# Patient Record
Sex: Female | Born: 1935 | Race: White | Hispanic: No | Smoking: Never smoker
Health system: Southern US, Community
[De-identification: ages and names within clinical notes are randomized; demographics above are authoritative.]

## PROBLEM LIST (undated history)

## (undated) DIAGNOSIS — I340 Nonrheumatic mitral (valve) insufficiency: Secondary | ICD-10-CM

## (undated) DIAGNOSIS — D689 Coagulation defect, unspecified: Secondary | ICD-10-CM

## (undated) DIAGNOSIS — R9430 Abnormal result of cardiovascular function study, unspecified: Secondary | ICD-10-CM

## (undated) DIAGNOSIS — M8580 Other specified disorders of bone density and structure, unspecified site: Secondary | ICD-10-CM

## (undated) DIAGNOSIS — IMO0002 Reserved for concepts with insufficient information to code with codable children: Secondary | ICD-10-CM

## (undated) DIAGNOSIS — E039 Hypothyroidism, unspecified: Secondary | ICD-10-CM

## (undated) DIAGNOSIS — B029 Zoster without complications: Secondary | ICD-10-CM

## (undated) DIAGNOSIS — I639 Cerebral infarction, unspecified: Secondary | ICD-10-CM

## (undated) DIAGNOSIS — M1990 Unspecified osteoarthritis, unspecified site: Secondary | ICD-10-CM

## (undated) DIAGNOSIS — IMO0001 Reserved for inherently not codable concepts without codable children: Secondary | ICD-10-CM

## (undated) DIAGNOSIS — M810 Age-related osteoporosis without current pathological fracture: Secondary | ICD-10-CM

## (undated) DIAGNOSIS — K219 Gastro-esophageal reflux disease without esophagitis: Secondary | ICD-10-CM

## (undated) DIAGNOSIS — I671 Cerebral aneurysm, nonruptured: Secondary | ICD-10-CM

## (undated) DIAGNOSIS — Z8541 Personal history of malignant neoplasm of cervix uteri: Secondary | ICD-10-CM

## (undated) DIAGNOSIS — J90 Pleural effusion, not elsewhere classified: Secondary | ICD-10-CM

## (undated) DIAGNOSIS — I351 Nonrheumatic aortic (valve) insufficiency: Secondary | ICD-10-CM

## (undated) DIAGNOSIS — I10 Essential (primary) hypertension: Secondary | ICD-10-CM

## (undated) DIAGNOSIS — I2699 Other pulmonary embolism without acute cor pulmonale: Secondary | ICD-10-CM

## (undated) DIAGNOSIS — I609 Nontraumatic subarachnoid hemorrhage, unspecified: Secondary | ICD-10-CM

## (undated) DIAGNOSIS — Z86718 Personal history of other venous thrombosis and embolism: Secondary | ICD-10-CM

## (undated) DIAGNOSIS — I509 Heart failure, unspecified: Secondary | ICD-10-CM

## (undated) DIAGNOSIS — E785 Hyperlipidemia, unspecified: Secondary | ICD-10-CM

## (undated) DIAGNOSIS — N289 Disorder of kidney and ureter, unspecified: Secondary | ICD-10-CM

## (undated) DIAGNOSIS — J449 Chronic obstructive pulmonary disease, unspecified: Secondary | ICD-10-CM

## (undated) DIAGNOSIS — D3500 Benign neoplasm of unspecified adrenal gland: Secondary | ICD-10-CM

## (undated) DIAGNOSIS — H3530 Unspecified macular degeneration: Secondary | ICD-10-CM

## (undated) DIAGNOSIS — D696 Thrombocytopenia, unspecified: Secondary | ICD-10-CM

## (undated) DIAGNOSIS — N189 Chronic kidney disease, unspecified: Secondary | ICD-10-CM

## (undated) DIAGNOSIS — I77810 Thoracic aortic ectasia: Secondary | ICD-10-CM

## (undated) DIAGNOSIS — I4891 Unspecified atrial fibrillation: Secondary | ICD-10-CM

## (undated) DIAGNOSIS — D649 Anemia, unspecified: Secondary | ICD-10-CM

## (undated) DIAGNOSIS — R0602 Shortness of breath: Secondary | ICD-10-CM

## (undated) DIAGNOSIS — D519 Vitamin B12 deficiency anemia, unspecified: Secondary | ICD-10-CM

## (undated) DIAGNOSIS — R42 Dizziness and giddiness: Secondary | ICD-10-CM

## (undated) DIAGNOSIS — N259 Disorder resulting from impaired renal tubular function, unspecified: Secondary | ICD-10-CM

## (undated) HISTORY — DX: Cerebral infarction, unspecified: I63.9

## (undated) HISTORY — DX: Benign neoplasm of unspecified adrenal gland: D35.00

## (undated) HISTORY — DX: Unspecified atrial fibrillation: I48.91

## (undated) HISTORY — DX: Personal history of other venous thrombosis and embolism: Z86.718

## (undated) HISTORY — DX: Chronic kidney disease, unspecified: N18.9

## (undated) HISTORY — DX: Unspecified osteoarthritis, unspecified site: M19.90

## (undated) HISTORY — DX: Gastro-esophageal reflux disease without esophagitis: K21.9

## (undated) HISTORY — DX: Other pulmonary embolism without acute cor pulmonale: I26.99

## (undated) HISTORY — DX: Hypothyroidism, unspecified: E03.9

## (undated) HISTORY — PX: CHOLECYSTECTOMY: SHX55

## (undated) HISTORY — PX: APPENDECTOMY: SHX54

## (undated) HISTORY — DX: Dizziness and giddiness: R42

## (undated) HISTORY — PX: CATARACT EXTRACTION: SUR2

## (undated) HISTORY — DX: Anemia, unspecified: D64.9

## (undated) HISTORY — PX: FOOT SURGERY: SHX648

## (undated) HISTORY — PX: ORIF HIP FRACTURE: SHX2125

## (undated) HISTORY — PX: JOINT REPLACEMENT: SHX530

## (undated) HISTORY — DX: Coagulation defect, unspecified: D68.9

## (undated) HISTORY — DX: Essential (primary) hypertension: I10

## (undated) HISTORY — DX: Nonrheumatic mitral (valve) insufficiency: I34.0

## (undated) HISTORY — DX: Age-related osteoporosis without current pathological fracture: M81.0

## (undated) HISTORY — DX: Disorder of kidney and ureter, unspecified: N28.9

## (undated) HISTORY — DX: Reserved for concepts with insufficient information to code with codable children: IMO0002

## (undated) HISTORY — DX: Heart failure, unspecified: I50.9

## (undated) HISTORY — DX: Zoster without complications: B02.9

## (undated) HISTORY — DX: Disorder resulting from impaired renal tubular function, unspecified: N25.9

## (undated) HISTORY — DX: Chronic obstructive pulmonary disease, unspecified: J44.9

## (undated) HISTORY — PX: PULMONARY EMBOLISM SURGERY: SHX752

## (undated) HISTORY — PX: TOTAL HIP ARTHROPLASTY: SHX124

## (undated) HISTORY — DX: Abnormal result of cardiovascular function study, unspecified: R94.30

## (undated) HISTORY — DX: Pleural effusion, not elsewhere classified: J90

## (undated) HISTORY — DX: Personal history of malignant neoplasm of cervix uteri: Z85.41

## (undated) HISTORY — DX: Cerebral aneurysm, nonruptured: I67.1

## (undated) HISTORY — DX: Thrombocytopenia, unspecified: D69.6

## (undated) HISTORY — DX: Nonrheumatic aortic (valve) insufficiency: I35.1

## (undated) HISTORY — DX: Hyperlipidemia, unspecified: E78.5

## (undated) HISTORY — PX: EPIDURAL BLOCK INJECTION: SHX1516

## (undated) HISTORY — DX: Nontraumatic subarachnoid hemorrhage, unspecified: I60.9

## (undated) HISTORY — PX: ABDOMINAL HYSTERECTOMY: SHX81

## (undated) HISTORY — DX: Shortness of breath: R06.02

## (undated) HISTORY — DX: Unspecified macular degeneration: H35.30

## (undated) HISTORY — DX: Reserved for inherently not codable concepts without codable children: IMO0001

## (undated) HISTORY — DX: Thoracic aortic ectasia: I77.810

## (undated) HISTORY — DX: Other specified disorders of bone density and structure, unspecified site: M85.80

## (undated) HISTORY — DX: Vitamin B12 deficiency anemia, unspecified: D51.9

---

## 2000-09-27 ENCOUNTER — Other Ambulatory Visit: Admission: RE | Admit: 2000-09-27 | Discharge: 2000-09-27 | Payer: Self-pay | Admitting: *Deleted

## 2001-09-10 ENCOUNTER — Inpatient Hospital Stay (HOSPITAL_COMMUNITY): Admission: EM | Admit: 2001-09-10 | Discharge: 2001-09-22 | Payer: Self-pay | Admitting: Emergency Medicine

## 2001-09-12 ENCOUNTER — Encounter: Payer: Self-pay | Admitting: Internal Medicine

## 2001-09-15 ENCOUNTER — Encounter: Payer: Self-pay | Admitting: Internal Medicine

## 2001-09-18 ENCOUNTER — Encounter: Payer: Self-pay | Admitting: Internal Medicine

## 2001-10-19 ENCOUNTER — Ambulatory Visit (HOSPITAL_COMMUNITY): Admission: RE | Admit: 2001-10-19 | Discharge: 2001-10-19 | Payer: Self-pay | Admitting: Family Medicine

## 2001-10-19 ENCOUNTER — Encounter: Payer: Self-pay | Admitting: Family Medicine

## 2002-02-03 ENCOUNTER — Encounter: Payer: Self-pay | Admitting: Surgery

## 2002-02-03 ENCOUNTER — Ambulatory Visit (HOSPITAL_COMMUNITY): Admission: RE | Admit: 2002-02-03 | Discharge: 2002-02-03 | Payer: Self-pay | Admitting: Surgery

## 2002-02-03 ENCOUNTER — Encounter (INDEPENDENT_AMBULATORY_CARE_PROVIDER_SITE_OTHER): Payer: Self-pay | Admitting: *Deleted

## 2002-03-25 ENCOUNTER — Encounter: Payer: Self-pay | Admitting: Emergency Medicine

## 2002-03-25 ENCOUNTER — Emergency Department (HOSPITAL_COMMUNITY): Admission: EM | Admit: 2002-03-25 | Discharge: 2002-03-25 | Payer: Self-pay

## 2002-04-25 ENCOUNTER — Encounter: Payer: Self-pay | Admitting: Family Medicine

## 2002-04-25 ENCOUNTER — Inpatient Hospital Stay (HOSPITAL_COMMUNITY): Admission: EM | Admit: 2002-04-25 | Discharge: 2002-04-28 | Payer: Self-pay | Admitting: Emergency Medicine

## 2002-07-27 ENCOUNTER — Encounter (INDEPENDENT_AMBULATORY_CARE_PROVIDER_SITE_OTHER): Payer: Self-pay | Admitting: *Deleted

## 2002-07-27 ENCOUNTER — Encounter: Payer: Self-pay | Admitting: Surgery

## 2002-07-27 ENCOUNTER — Ambulatory Visit (HOSPITAL_COMMUNITY): Admission: RE | Admit: 2002-07-27 | Discharge: 2002-07-27 | Payer: Self-pay | Admitting: Surgery

## 2003-03-01 ENCOUNTER — Inpatient Hospital Stay (HOSPITAL_COMMUNITY): Admission: AD | Admit: 2003-03-01 | Discharge: 2003-03-08 | Payer: Self-pay | Admitting: Family Medicine

## 2003-08-15 ENCOUNTER — Ambulatory Visit: Admission: RE | Admit: 2003-08-15 | Discharge: 2003-08-15 | Payer: Self-pay | Admitting: Family Medicine

## 2003-12-24 ENCOUNTER — Ambulatory Visit: Payer: Self-pay | Admitting: Family Medicine

## 2004-02-20 ENCOUNTER — Ambulatory Visit: Payer: Self-pay | Admitting: Family Medicine

## 2004-03-03 ENCOUNTER — Ambulatory Visit: Payer: Self-pay | Admitting: Internal Medicine

## 2004-03-05 ENCOUNTER — Ambulatory Visit: Payer: Self-pay | Admitting: Family Medicine

## 2004-03-14 ENCOUNTER — Ambulatory Visit: Payer: Self-pay | Admitting: Family Medicine

## 2004-03-24 ENCOUNTER — Ambulatory Visit: Payer: Self-pay | Admitting: Family Medicine

## 2004-04-16 ENCOUNTER — Ambulatory Visit: Payer: Self-pay | Admitting: Family Medicine

## 2004-04-16 ENCOUNTER — Inpatient Hospital Stay (HOSPITAL_COMMUNITY): Admission: RE | Admit: 2004-04-16 | Discharge: 2004-04-21 | Payer: Self-pay | Admitting: Family Medicine

## 2004-04-16 ENCOUNTER — Ambulatory Visit: Payer: Self-pay | Admitting: Internal Medicine

## 2004-04-25 ENCOUNTER — Ambulatory Visit: Payer: Self-pay | Admitting: Family Medicine

## 2004-04-28 ENCOUNTER — Ambulatory Visit: Payer: Self-pay | Admitting: Family Medicine

## 2004-05-01 ENCOUNTER — Ambulatory Visit: Payer: Self-pay | Admitting: Family Medicine

## 2004-05-08 ENCOUNTER — Ambulatory Visit: Payer: Self-pay | Admitting: Family Medicine

## 2004-05-12 ENCOUNTER — Ambulatory Visit: Payer: Self-pay | Admitting: Family Medicine

## 2004-05-15 ENCOUNTER — Ambulatory Visit: Payer: Self-pay | Admitting: Hematology & Oncology

## 2004-05-19 ENCOUNTER — Ambulatory Visit: Payer: Self-pay | Admitting: Family Medicine

## 2004-05-26 ENCOUNTER — Ambulatory Visit: Payer: Self-pay | Admitting: Internal Medicine

## 2004-06-02 ENCOUNTER — Ambulatory Visit: Payer: Self-pay | Admitting: Family Medicine

## 2004-06-09 ENCOUNTER — Ambulatory Visit: Payer: Self-pay | Admitting: Family Medicine

## 2004-06-16 ENCOUNTER — Ambulatory Visit: Payer: Self-pay | Admitting: Family Medicine

## 2004-06-26 ENCOUNTER — Ambulatory Visit: Payer: Self-pay | Admitting: Family Medicine

## 2004-06-26 ENCOUNTER — Ambulatory Visit (HOSPITAL_COMMUNITY): Admission: RE | Admit: 2004-06-26 | Discharge: 2004-06-26 | Payer: Self-pay | Admitting: Family Medicine

## 2004-07-02 ENCOUNTER — Ambulatory Visit: Payer: Self-pay | Admitting: *Deleted

## 2004-07-02 ENCOUNTER — Encounter (INDEPENDENT_AMBULATORY_CARE_PROVIDER_SITE_OTHER): Payer: Self-pay | Admitting: *Deleted

## 2004-07-03 ENCOUNTER — Ambulatory Visit: Payer: Self-pay | Admitting: Internal Medicine

## 2004-07-14 ENCOUNTER — Ambulatory Visit: Payer: Self-pay | Admitting: Family Medicine

## 2004-07-28 ENCOUNTER — Ambulatory Visit: Payer: Self-pay | Admitting: Family Medicine

## 2004-08-13 ENCOUNTER — Ambulatory Visit: Payer: Self-pay | Admitting: Family Medicine

## 2004-08-15 ENCOUNTER — Ambulatory Visit: Payer: Self-pay | Admitting: Family Medicine

## 2004-08-28 ENCOUNTER — Ambulatory Visit: Payer: Self-pay | Admitting: Family Medicine

## 2004-09-11 ENCOUNTER — Ambulatory Visit: Payer: Self-pay | Admitting: Family Medicine

## 2004-10-15 ENCOUNTER — Ambulatory Visit: Payer: Self-pay | Admitting: Family Medicine

## 2004-10-22 ENCOUNTER — Ambulatory Visit: Payer: Self-pay | Admitting: Family Medicine

## 2004-10-29 ENCOUNTER — Ambulatory Visit: Payer: Self-pay | Admitting: Internal Medicine

## 2004-11-05 ENCOUNTER — Ambulatory Visit: Payer: Self-pay | Admitting: Family Medicine

## 2004-12-03 ENCOUNTER — Ambulatory Visit: Payer: Self-pay | Admitting: Family Medicine

## 2005-01-13 ENCOUNTER — Ambulatory Visit: Payer: Self-pay | Admitting: Family Medicine

## 2005-01-27 ENCOUNTER — Encounter: Admission: RE | Admit: 2005-01-27 | Discharge: 2005-01-27 | Payer: Self-pay | Admitting: Family Medicine

## 2005-01-27 ENCOUNTER — Ambulatory Visit: Payer: Self-pay | Admitting: Family Medicine

## 2005-02-09 DIAGNOSIS — I671 Cerebral aneurysm, nonruptured: Secondary | ICD-10-CM

## 2005-02-09 HISTORY — DX: Cerebral aneurysm, nonruptured: I67.1

## 2005-02-11 ENCOUNTER — Ambulatory Visit: Payer: Self-pay | Admitting: Family Medicine

## 2005-02-18 ENCOUNTER — Ambulatory Visit: Payer: Self-pay | Admitting: Family Medicine

## 2005-03-16 ENCOUNTER — Ambulatory Visit: Payer: Self-pay | Admitting: Family Medicine

## 2005-04-15 ENCOUNTER — Ambulatory Visit: Payer: Self-pay | Admitting: Family Medicine

## 2005-05-14 ENCOUNTER — Ambulatory Visit: Payer: Self-pay | Admitting: Internal Medicine

## 2005-05-19 ENCOUNTER — Ambulatory Visit: Payer: Self-pay | Admitting: Family Medicine

## 2005-06-11 ENCOUNTER — Ambulatory Visit: Payer: Self-pay | Admitting: Family Medicine

## 2005-06-19 ENCOUNTER — Ambulatory Visit: Payer: Self-pay | Admitting: Family Medicine

## 2005-06-27 ENCOUNTER — Ambulatory Visit: Payer: Self-pay | Admitting: Internal Medicine

## 2005-06-29 ENCOUNTER — Ambulatory Visit: Payer: Self-pay | Admitting: Family Medicine

## 2005-07-02 ENCOUNTER — Ambulatory Visit: Payer: Self-pay | Admitting: Family Medicine

## 2005-07-09 ENCOUNTER — Ambulatory Visit: Payer: Self-pay | Admitting: Family Medicine

## 2005-07-13 ENCOUNTER — Ambulatory Visit: Payer: Self-pay | Admitting: Family Medicine

## 2005-07-27 ENCOUNTER — Ambulatory Visit: Payer: Self-pay | Admitting: Family Medicine

## 2005-08-03 ENCOUNTER — Ambulatory Visit: Payer: Self-pay | Admitting: Family Medicine

## 2005-08-17 ENCOUNTER — Ambulatory Visit: Payer: Self-pay | Admitting: Family Medicine

## 2005-08-28 ENCOUNTER — Ambulatory Visit: Payer: Self-pay | Admitting: Family Medicine

## 2005-09-11 ENCOUNTER — Ambulatory Visit: Payer: Self-pay | Admitting: Family Medicine

## 2005-09-25 ENCOUNTER — Ambulatory Visit: Payer: Self-pay

## 2005-09-25 ENCOUNTER — Encounter: Payer: Self-pay | Admitting: Cardiology

## 2005-09-26 ENCOUNTER — Inpatient Hospital Stay (HOSPITAL_COMMUNITY): Admission: EM | Admit: 2005-09-26 | Discharge: 2005-10-09 | Payer: Self-pay | Admitting: Emergency Medicine

## 2005-09-27 ENCOUNTER — Encounter (INDEPENDENT_AMBULATORY_CARE_PROVIDER_SITE_OTHER): Payer: Self-pay | Admitting: *Deleted

## 2005-11-25 ENCOUNTER — Ambulatory Visit: Payer: Self-pay | Admitting: Family Medicine

## 2005-12-17 ENCOUNTER — Ambulatory Visit: Payer: Self-pay | Admitting: Family Medicine

## 2005-12-24 ENCOUNTER — Ambulatory Visit: Payer: Self-pay | Admitting: Family Medicine

## 2005-12-24 LAB — CONVERTED CEMR LAB
ALT: 23 units/L (ref 0–40)
AST: 36 units/L (ref 0–37)
Alkaline Phosphatase: 56 units/L (ref 39–117)
BUN: 25 mg/dL — ABNORMAL HIGH (ref 6–23)
Calcium: 9.8 mg/dL (ref 8.4–10.5)
Chloride: 102 meq/L (ref 96–112)
GFR calc non Af Amer: 33 mL/min
Glomerular Filtration Rate, Af Am: 39 mL/min/{1.73_m2}
Glucose, Bld: 91 mg/dL (ref 70–99)
Potassium: 3.8 meq/L (ref 3.5–5.1)
TSH: 4.87 microintl units/mL (ref 0.35–5.50)
Total Protein: 7.3 g/dL (ref 6.0–8.3)

## 2006-01-11 ENCOUNTER — Ambulatory Visit: Payer: Self-pay | Admitting: Family Medicine

## 2006-01-28 ENCOUNTER — Ambulatory Visit: Payer: Self-pay | Admitting: Family Medicine

## 2006-02-11 ENCOUNTER — Ambulatory Visit: Payer: Self-pay | Admitting: Family Medicine

## 2006-02-15 DIAGNOSIS — I10 Essential (primary) hypertension: Secondary | ICD-10-CM | POA: Insufficient documentation

## 2006-02-15 DIAGNOSIS — Z86718 Personal history of other venous thrombosis and embolism: Secondary | ICD-10-CM

## 2006-02-15 DIAGNOSIS — E785 Hyperlipidemia, unspecified: Secondary | ICD-10-CM

## 2006-02-16 ENCOUNTER — Encounter: Payer: Self-pay | Admitting: Family Medicine

## 2006-02-19 ENCOUNTER — Emergency Department (HOSPITAL_COMMUNITY): Admission: EM | Admit: 2006-02-19 | Discharge: 2006-02-20 | Payer: Self-pay | Admitting: Emergency Medicine

## 2006-02-19 ENCOUNTER — Ambulatory Visit: Payer: Self-pay | Admitting: Family Medicine

## 2006-02-19 ENCOUNTER — Encounter: Admission: RE | Admit: 2006-02-19 | Discharge: 2006-02-19 | Payer: Self-pay | Admitting: Family Medicine

## 2006-02-19 ENCOUNTER — Ambulatory Visit (HOSPITAL_COMMUNITY): Admission: RE | Admit: 2006-02-19 | Discharge: 2006-02-19 | Payer: Self-pay | Admitting: Family Medicine

## 2006-02-19 LAB — CONVERTED CEMR LAB
Albumin: 3.7 g/dL (ref 3.5–5.2)
Basophils Absolute: 0 10*3/uL (ref 0.0–0.1)
CK-MB: 1.4 ng/mL (ref 0.3–4.0)
CO2: 31 meq/L (ref 19–32)
Chloride: 108 meq/L (ref 96–112)
Creatinine, Ser: 1.4 mg/dL — ABNORMAL HIGH (ref 0.4–1.2)
Eosinophil percent: 2.5 % (ref 0.0–5.0)
GFR calc non Af Amer: 38 mL/min
Glucose, Bld: 93 mg/dL (ref 70–99)
HCT: 38.6 % (ref 36.0–46.0)
MCHC: 32.9 g/dL (ref 30.0–36.0)
MCV: 97.7 fL (ref 78.0–100.0)
Monocytes Absolute: 0.4 10*3/uL (ref 0.2–0.7)
Monocytes Relative: 6.2 % (ref 3.0–11.0)
Neutro Abs: 3.4 10*3/uL (ref 1.4–7.7)
RBC: 3.95 M/uL (ref 3.87–5.11)
RDW: 13.6 % (ref 11.5–14.6)
Sodium: 145 meq/L (ref 135–145)
Total Bilirubin: 0.7 mg/dL (ref 0.3–1.2)

## 2006-02-23 ENCOUNTER — Ambulatory Visit: Payer: Self-pay | Admitting: Family Medicine

## 2006-03-02 ENCOUNTER — Ambulatory Visit: Payer: Self-pay | Admitting: Family Medicine

## 2006-03-04 ENCOUNTER — Ambulatory Visit: Payer: Self-pay | Admitting: Family Medicine

## 2006-03-15 ENCOUNTER — Encounter (HOSPITAL_BASED_OUTPATIENT_CLINIC_OR_DEPARTMENT_OTHER): Admission: RE | Admit: 2006-03-15 | Discharge: 2006-04-08 | Payer: Self-pay | Admitting: Surgery

## 2006-03-19 ENCOUNTER — Ambulatory Visit: Payer: Self-pay | Admitting: Family Medicine

## 2006-04-07 ENCOUNTER — Ambulatory Visit: Payer: Self-pay | Admitting: Family Medicine

## 2006-04-07 ENCOUNTER — Ambulatory Visit: Payer: Self-pay | Admitting: Internal Medicine

## 2006-04-16 ENCOUNTER — Ambulatory Visit: Payer: Self-pay | Admitting: Family Medicine

## 2006-05-05 ENCOUNTER — Ambulatory Visit: Payer: Self-pay | Admitting: Family Medicine

## 2006-05-17 ENCOUNTER — Ambulatory Visit: Payer: Self-pay | Admitting: Family Medicine

## 2006-05-19 ENCOUNTER — Ambulatory Visit: Payer: Self-pay | Admitting: Family Medicine

## 2006-05-19 LAB — CONVERTED CEMR LAB
AST: 31 units/L (ref 0–37)
Albumin: 3.7 g/dL (ref 3.5–5.2)
Bilirubin, Direct: 0.1 mg/dL (ref 0.0–0.3)
Chloride: 108 meq/L (ref 96–112)
Creatinine, Ser: 1.2 mg/dL (ref 0.4–1.2)
Glucose, Bld: 87 mg/dL (ref 70–99)
Potassium: 3.7 meq/L (ref 3.5–5.1)
Sodium: 146 meq/L — ABNORMAL HIGH (ref 135–145)
TSH: 0.35 microintl units/mL (ref 0.35–5.50)
Total Bilirubin: 0.6 mg/dL (ref 0.3–1.2)

## 2006-06-14 ENCOUNTER — Ambulatory Visit: Payer: Self-pay | Admitting: Family Medicine

## 2006-07-12 ENCOUNTER — Telehealth (INDEPENDENT_AMBULATORY_CARE_PROVIDER_SITE_OTHER): Payer: Self-pay | Admitting: *Deleted

## 2006-07-12 ENCOUNTER — Ambulatory Visit: Payer: Self-pay | Admitting: Family Medicine

## 2006-07-20 ENCOUNTER — Telehealth (INDEPENDENT_AMBULATORY_CARE_PROVIDER_SITE_OTHER): Payer: Self-pay | Admitting: *Deleted

## 2006-07-21 ENCOUNTER — Ambulatory Visit: Payer: Self-pay | Admitting: Cardiology

## 2006-07-21 ENCOUNTER — Ambulatory Visit: Payer: Self-pay | Admitting: Family Medicine

## 2006-07-21 DIAGNOSIS — R42 Dizziness and giddiness: Secondary | ICD-10-CM

## 2006-08-06 ENCOUNTER — Ambulatory Visit: Payer: Self-pay | Admitting: Family Medicine

## 2006-08-10 LAB — CONVERTED CEMR LAB
BUN: 35 mg/dL — ABNORMAL HIGH (ref 6–23)
CO2: 32 meq/L (ref 19–32)
Calcium: 9.4 mg/dL (ref 8.4–10.5)
Chloride: 108 meq/L (ref 96–112)
Creatinine, Ser: 1.5 mg/dL — ABNORMAL HIGH (ref 0.4–1.2)
GFR calc Af Amer: 42 mL/min

## 2006-08-11 ENCOUNTER — Ambulatory Visit: Payer: Self-pay | Admitting: Family Medicine

## 2006-08-11 DIAGNOSIS — D518 Other vitamin B12 deficiency anemias: Secondary | ICD-10-CM | POA: Insufficient documentation

## 2006-08-11 DIAGNOSIS — N6009 Solitary cyst of unspecified breast: Secondary | ICD-10-CM

## 2006-08-11 DIAGNOSIS — Z9189 Other specified personal risk factors, not elsewhere classified: Secondary | ICD-10-CM

## 2006-08-15 LAB — CONVERTED CEMR LAB
ALT: 17 units/L (ref 0–35)
Alkaline Phosphatase: 88 units/L (ref 39–117)
BUN: 28 mg/dL — ABNORMAL HIGH (ref 6–23)
Basophils Relative: 0.5 % (ref 0.0–1.0)
CO2: 33 meq/L — ABNORMAL HIGH (ref 19–32)
Calcium: 9.5 mg/dL (ref 8.4–10.5)
Eosinophils Absolute: 0.1 10*3/uL (ref 0.0–0.6)
GFR calc Af Amer: 50 mL/min
GFR calc non Af Amer: 41 mL/min
HDL: 64 mg/dL (ref >39.0)
Lymphocytes Relative: 34.7 % (ref 12.0–46.0)
Monocytes Relative: 6.4 % (ref 3.0–11.0)
Neutro Abs: 3.4 10*3/uL (ref 1.4–7.7)
Platelets: 158 10*3/uL (ref 150–400)
RBC: 3.89 M/uL (ref 3.87–5.11)
Total CHOL/HDL Ratio: 2.6
Triglycerides: 154 mg/dL — ABNORMAL HIGH (ref 0–149)
VLDL: 31 mg/dL (ref 0–40)

## 2006-08-18 ENCOUNTER — Ambulatory Visit: Payer: Self-pay | Admitting: Family Medicine

## 2006-08-18 LAB — CONVERTED CEMR LAB
Bilirubin Urine: NEGATIVE
Blood in Urine, dipstick: NEGATIVE
Ketones, urine, test strip: NEGATIVE
Protein, U semiquant: NEGATIVE
Urobilinogen, UA: NEGATIVE

## 2006-08-19 ENCOUNTER — Encounter (INDEPENDENT_AMBULATORY_CARE_PROVIDER_SITE_OTHER): Payer: Self-pay | Admitting: *Deleted

## 2006-08-26 ENCOUNTER — Telehealth (INDEPENDENT_AMBULATORY_CARE_PROVIDER_SITE_OTHER): Payer: Self-pay | Admitting: *Deleted

## 2006-09-08 ENCOUNTER — Ambulatory Visit: Payer: Self-pay | Admitting: Cardiology

## 2006-09-08 ENCOUNTER — Telehealth (INDEPENDENT_AMBULATORY_CARE_PROVIDER_SITE_OTHER): Payer: Self-pay | Admitting: *Deleted

## 2006-09-09 ENCOUNTER — Ambulatory Visit: Payer: Self-pay | Admitting: Family Medicine

## 2006-09-13 ENCOUNTER — Encounter (INDEPENDENT_AMBULATORY_CARE_PROVIDER_SITE_OTHER): Payer: Self-pay | Admitting: *Deleted

## 2006-10-18 ENCOUNTER — Ambulatory Visit: Payer: Self-pay | Admitting: Family Medicine

## 2006-11-05 ENCOUNTER — Ambulatory Visit: Payer: Self-pay | Admitting: Family Medicine

## 2006-12-06 ENCOUNTER — Ambulatory Visit: Payer: Self-pay | Admitting: Internal Medicine

## 2006-12-06 ENCOUNTER — Encounter: Payer: Self-pay | Admitting: Family Medicine

## 2006-12-07 ENCOUNTER — Ambulatory Visit: Payer: Self-pay | Admitting: Family Medicine

## 2006-12-13 ENCOUNTER — Telehealth: Payer: Self-pay | Admitting: Family Medicine

## 2007-01-04 ENCOUNTER — Ambulatory Visit: Payer: Self-pay | Admitting: Family Medicine

## 2007-01-12 ENCOUNTER — Telehealth (INDEPENDENT_AMBULATORY_CARE_PROVIDER_SITE_OTHER): Payer: Self-pay | Admitting: *Deleted

## 2007-01-12 ENCOUNTER — Telehealth: Payer: Self-pay | Admitting: Internal Medicine

## 2007-01-12 ENCOUNTER — Ambulatory Visit: Payer: Self-pay | Admitting: Family Medicine

## 2007-01-12 DIAGNOSIS — R079 Chest pain, unspecified: Secondary | ICD-10-CM

## 2007-01-13 LAB — CONVERTED CEMR LAB
Albumin: 4.2 g/dL (ref 3.5–5.2)
Alkaline Phosphatase: 89 units/L (ref 39–117)
BUN: 35 mg/dL — ABNORMAL HIGH (ref 6–23)
Basophils Relative: 0.4 % (ref 0.0–1.0)
CRP, High Sensitivity: 1 (ref 0.00–5.00)
Creatinine, Ser: 1.6 mg/dL — ABNORMAL HIGH (ref 0.4–1.2)
Eosinophils Absolute: 0.1 10*3/uL (ref 0.0–0.6)
Eosinophils Relative: 2.5 % (ref 0.0–5.0)
Folate: >20 ng/mL
GFR calc non Af Amer: 32 mL/min
MCV: 92.9 fL (ref 78.0–100.0)
Platelets: 145 10*3/uL — ABNORMAL LOW (ref 150–400)
Potassium: 4.5 meq/L (ref 3.5–5.1)
RBC: 3.83 M/uL — ABNORMAL LOW (ref 3.87–5.11)
TSH: 2.7 microintl units/mL (ref 0.35–5.50)
Total Bilirubin: 0.6 mg/dL (ref 0.3–1.2)
Total CHOL/HDL Ratio: 2.7
Triglycerides: 105 mg/dL (ref 0–149)
VLDL: 21 mg/dL (ref 0–40)
WBC: 5.5 10*3/uL (ref 4.5–10.5)

## 2007-01-25 ENCOUNTER — Encounter: Payer: Self-pay | Admitting: Internal Medicine

## 2007-01-25 ENCOUNTER — Ambulatory Visit: Payer: Self-pay | Admitting: Pulmonary Disease

## 2007-01-25 DIAGNOSIS — R32 Unspecified urinary incontinence: Secondary | ICD-10-CM | POA: Insufficient documentation

## 2007-01-25 DIAGNOSIS — Z8541 Personal history of malignant neoplasm of cervix uteri: Secondary | ICD-10-CM | POA: Insufficient documentation

## 2007-01-25 DIAGNOSIS — M1990 Unspecified osteoarthritis, unspecified site: Secondary | ICD-10-CM | POA: Insufficient documentation

## 2007-01-25 DIAGNOSIS — J4489 Other specified chronic obstructive pulmonary disease: Secondary | ICD-10-CM | POA: Insufficient documentation

## 2007-01-25 DIAGNOSIS — J449 Chronic obstructive pulmonary disease, unspecified: Secondary | ICD-10-CM

## 2007-01-25 DIAGNOSIS — D3500 Benign neoplasm of unspecified adrenal gland: Secondary | ICD-10-CM

## 2007-01-25 DIAGNOSIS — E039 Hypothyroidism, unspecified: Secondary | ICD-10-CM | POA: Insufficient documentation

## 2007-01-27 ENCOUNTER — Encounter: Payer: Self-pay | Admitting: Pulmonary Disease

## 2007-01-27 ENCOUNTER — Ambulatory Visit (HOSPITAL_COMMUNITY): Admission: RE | Admit: 2007-01-27 | Discharge: 2007-01-27 | Payer: Self-pay | Admitting: Pulmonary Disease

## 2007-01-31 ENCOUNTER — Ambulatory Visit: Payer: Self-pay

## 2007-01-31 ENCOUNTER — Ambulatory Visit: Payer: Self-pay | Admitting: Pulmonary Disease

## 2007-01-31 ENCOUNTER — Telehealth (INDEPENDENT_AMBULATORY_CARE_PROVIDER_SITE_OTHER): Payer: Self-pay | Admitting: *Deleted

## 2007-02-01 ENCOUNTER — Ambulatory Visit: Payer: Self-pay | Admitting: Family Medicine

## 2007-02-08 ENCOUNTER — Encounter: Payer: Self-pay | Admitting: Pulmonary Disease

## 2007-02-14 ENCOUNTER — Telehealth (INDEPENDENT_AMBULATORY_CARE_PROVIDER_SITE_OTHER): Payer: Self-pay | Admitting: *Deleted

## 2007-02-15 ENCOUNTER — Ambulatory Visit (HOSPITAL_COMMUNITY): Admission: RE | Admit: 2007-02-15 | Discharge: 2007-02-15 | Payer: Self-pay | Admitting: Pulmonary Disease

## 2007-02-28 ENCOUNTER — Ambulatory Visit: Payer: Self-pay | Admitting: Family Medicine

## 2007-02-28 DIAGNOSIS — M949 Disorder of cartilage, unspecified: Secondary | ICD-10-CM

## 2007-02-28 DIAGNOSIS — M899 Disorder of bone, unspecified: Secondary | ICD-10-CM | POA: Insufficient documentation

## 2007-03-28 ENCOUNTER — Ambulatory Visit: Payer: Self-pay | Admitting: Family Medicine

## 2007-04-12 ENCOUNTER — Telehealth (INDEPENDENT_AMBULATORY_CARE_PROVIDER_SITE_OTHER): Payer: Self-pay | Admitting: *Deleted

## 2007-04-22 ENCOUNTER — Ambulatory Visit: Payer: Self-pay | Admitting: Pulmonary Disease

## 2007-04-25 ENCOUNTER — Ambulatory Visit: Payer: Self-pay | Admitting: Family Medicine

## 2007-04-29 ENCOUNTER — Ambulatory Visit: Payer: Self-pay | Admitting: Family Medicine

## 2007-04-29 DIAGNOSIS — M546 Pain in thoracic spine: Secondary | ICD-10-CM

## 2007-05-02 ENCOUNTER — Ambulatory Visit: Payer: Self-pay | Admitting: Family Medicine

## 2007-05-06 ENCOUNTER — Telehealth (INDEPENDENT_AMBULATORY_CARE_PROVIDER_SITE_OTHER): Payer: Self-pay | Admitting: *Deleted

## 2007-05-23 ENCOUNTER — Ambulatory Visit: Payer: Self-pay | Admitting: Family Medicine

## 2007-06-16 ENCOUNTER — Encounter: Admission: RE | Admit: 2007-06-16 | Discharge: 2007-06-16 | Payer: Self-pay | Admitting: Podiatry

## 2007-06-21 ENCOUNTER — Ambulatory Visit: Payer: Self-pay | Admitting: Family Medicine

## 2007-07-14 ENCOUNTER — Ambulatory Visit: Payer: Self-pay | Admitting: Family Medicine

## 2007-07-22 ENCOUNTER — Telehealth (INDEPENDENT_AMBULATORY_CARE_PROVIDER_SITE_OTHER): Payer: Self-pay | Admitting: *Deleted

## 2007-07-27 ENCOUNTER — Ambulatory Visit: Payer: Self-pay | Admitting: Internal Medicine

## 2007-07-27 LAB — CONVERTED CEMR LAB: Inflenza A Ag: NEGATIVE

## 2007-07-28 LAB — CONVERTED CEMR LAB
Basophils Relative: 0.4 % (ref 0.0–1.0)
CO2: 34 meq/L — ABNORMAL HIGH (ref 19–32)
Calcium: 9.2 mg/dL (ref 8.4–10.5)
Chloride: 99 meq/L (ref 96–112)
Creatinine, Ser: 1.3 mg/dL — ABNORMAL HIGH (ref 0.4–1.2)
Glucose, Bld: 110 mg/dL — ABNORMAL HIGH (ref 70–99)
Hemoglobin: 11.4 g/dL — ABNORMAL LOW (ref 12.0–15.0)
Lymphocytes Relative: 19.2 % (ref 12.0–46.0)
Monocytes Relative: 7.9 % (ref 3.0–12.0)
Neutro Abs: 6.4 10*3/uL (ref 1.4–7.7)
RBC: 3.62 M/uL — ABNORMAL LOW (ref 3.87–5.11)
WBC: 9 10*3/uL (ref 4.5–10.5)

## 2007-07-29 ENCOUNTER — Encounter (INDEPENDENT_AMBULATORY_CARE_PROVIDER_SITE_OTHER): Payer: Self-pay | Admitting: *Deleted

## 2007-07-31 ENCOUNTER — Inpatient Hospital Stay (HOSPITAL_COMMUNITY): Admission: EM | Admit: 2007-07-31 | Discharge: 2007-08-05 | Payer: Self-pay | Admitting: Emergency Medicine

## 2007-07-31 ENCOUNTER — Encounter: Payer: Self-pay | Admitting: Internal Medicine

## 2007-07-31 DIAGNOSIS — I609 Nontraumatic subarachnoid hemorrhage, unspecified: Secondary | ICD-10-CM

## 2007-07-31 DIAGNOSIS — S72009A Fracture of unspecified part of neck of unspecified femur, initial encounter for closed fracture: Secondary | ICD-10-CM | POA: Insufficient documentation

## 2007-08-02 ENCOUNTER — Ambulatory Visit: Payer: Self-pay | Admitting: Internal Medicine

## 2007-08-29 ENCOUNTER — Telehealth (INDEPENDENT_AMBULATORY_CARE_PROVIDER_SITE_OTHER): Payer: Self-pay | Admitting: *Deleted

## 2007-09-05 ENCOUNTER — Ambulatory Visit: Payer: Self-pay | Admitting: Family Medicine

## 2007-09-08 ENCOUNTER — Telehealth (INDEPENDENT_AMBULATORY_CARE_PROVIDER_SITE_OTHER): Payer: Self-pay | Admitting: *Deleted

## 2007-09-08 ENCOUNTER — Encounter (INDEPENDENT_AMBULATORY_CARE_PROVIDER_SITE_OTHER): Payer: Self-pay | Admitting: *Deleted

## 2007-09-08 LAB — CONVERTED CEMR LAB
Alkaline Phosphatase: 76 units/L (ref 39–117)
Basophils Absolute: 0 10*3/uL (ref 0.0–0.1)
Basophils Relative: 0.3 % (ref 0.0–3.0)
Bilirubin, Direct: 0.1 mg/dL (ref 0.0–0.3)
Calcium: 9.4 mg/dL (ref 8.4–10.5)
Cholesterol: 136 mg/dL (ref 0–200)
Eosinophils Absolute: 0.5 10*3/uL (ref 0.0–0.7)
Folate: >20 ng/mL
GFR calc Af Amer: 42 mL/min
GFR calc non Af Amer: 35 mL/min
Glucose, Bld: 95 mg/dL (ref 70–99)
HDL: 44 mg/dL (ref >39.0)
LDL Cholesterol: 68 mg/dL (ref 0–99)
Lymphocytes Relative: 35.4 % (ref 12.0–46.0)
MCHC: 33.1 g/dL (ref 30.0–36.0)
Neutrophils Relative %: 54.9 % (ref 43.0–77.0)
Platelets: 149 10*3/uL — ABNORMAL LOW (ref 150–400)
Potassium: 4.1 meq/L (ref 3.5–5.1)
RBC: 3.46 M/uL — ABNORMAL LOW (ref 3.87–5.11)
Sodium: 143 meq/L (ref 135–145)
Total Bilirubin: 0.5 mg/dL (ref 0.3–1.2)
Total Protein: 8.2 g/dL (ref 6.0–8.3)
VLDL: 24 mg/dL (ref 0–40)
WBC: 6.8 10*3/uL (ref 4.5–10.5)

## 2007-09-09 ENCOUNTER — Ambulatory Visit: Payer: Self-pay | Admitting: Family Medicine

## 2007-09-09 ENCOUNTER — Ambulatory Visit: Payer: Self-pay | Admitting: Cardiology

## 2007-09-09 ENCOUNTER — Telehealth (INDEPENDENT_AMBULATORY_CARE_PROVIDER_SITE_OTHER): Payer: Self-pay | Admitting: *Deleted

## 2007-09-09 DIAGNOSIS — R519 Headache, unspecified: Secondary | ICD-10-CM | POA: Insufficient documentation

## 2007-09-09 DIAGNOSIS — R51 Headache: Secondary | ICD-10-CM

## 2007-09-15 ENCOUNTER — Telehealth: Payer: Self-pay | Admitting: Family Medicine

## 2007-09-15 ENCOUNTER — Ambulatory Visit: Payer: Self-pay | Admitting: Internal Medicine

## 2007-09-15 ENCOUNTER — Telehealth (INDEPENDENT_AMBULATORY_CARE_PROVIDER_SITE_OTHER): Payer: Self-pay | Admitting: *Deleted

## 2007-09-15 DIAGNOSIS — R930 Abnormal findings on diagnostic imaging of skull and head, not elsewhere classified: Secondary | ICD-10-CM | POA: Insufficient documentation

## 2007-09-15 DIAGNOSIS — I712 Thoracic aortic aneurysm, without rupture, unspecified: Secondary | ICD-10-CM | POA: Insufficient documentation

## 2007-09-16 ENCOUNTER — Encounter (INDEPENDENT_AMBULATORY_CARE_PROVIDER_SITE_OTHER): Payer: Self-pay | Admitting: *Deleted

## 2007-09-19 ENCOUNTER — Telehealth (INDEPENDENT_AMBULATORY_CARE_PROVIDER_SITE_OTHER): Payer: Self-pay | Admitting: *Deleted

## 2007-09-26 ENCOUNTER — Ambulatory Visit: Payer: Self-pay | Admitting: Pulmonary Disease

## 2007-09-27 ENCOUNTER — Encounter: Admission: RE | Admit: 2007-09-27 | Discharge: 2007-12-15 | Payer: Self-pay | Admitting: Orthopedic Surgery

## 2007-10-18 ENCOUNTER — Telehealth (INDEPENDENT_AMBULATORY_CARE_PROVIDER_SITE_OTHER): Payer: Self-pay | Admitting: *Deleted

## 2007-10-27 ENCOUNTER — Encounter: Payer: Self-pay | Admitting: Family Medicine

## 2007-11-01 ENCOUNTER — Ambulatory Visit: Payer: Self-pay | Admitting: Family Medicine

## 2007-11-02 ENCOUNTER — Ambulatory Visit: Payer: Self-pay | Admitting: Cardiology

## 2007-11-15 ENCOUNTER — Ambulatory Visit: Payer: Self-pay | Admitting: Family Medicine

## 2007-12-01 ENCOUNTER — Ambulatory Visit: Payer: Self-pay | Admitting: Family Medicine

## 2007-12-26 ENCOUNTER — Ambulatory Visit: Payer: Self-pay | Admitting: Cardiology

## 2008-01-02 ENCOUNTER — Ambulatory Visit: Payer: Self-pay | Admitting: Family Medicine

## 2008-01-02 ENCOUNTER — Telehealth (INDEPENDENT_AMBULATORY_CARE_PROVIDER_SITE_OTHER): Payer: Self-pay | Admitting: *Deleted

## 2008-01-02 DIAGNOSIS — M549 Dorsalgia, unspecified: Secondary | ICD-10-CM | POA: Insufficient documentation

## 2008-02-14 ENCOUNTER — Ambulatory Visit: Payer: Self-pay | Admitting: Family Medicine

## 2008-02-15 ENCOUNTER — Encounter: Payer: Self-pay | Admitting: Family Medicine

## 2008-02-29 ENCOUNTER — Telehealth (INDEPENDENT_AMBULATORY_CARE_PROVIDER_SITE_OTHER): Payer: Self-pay | Admitting: *Deleted

## 2008-03-06 ENCOUNTER — Telehealth (INDEPENDENT_AMBULATORY_CARE_PROVIDER_SITE_OTHER): Payer: Self-pay | Admitting: *Deleted

## 2008-03-12 ENCOUNTER — Telehealth: Payer: Self-pay | Admitting: Family Medicine

## 2008-03-13 ENCOUNTER — Encounter: Payer: Self-pay | Admitting: Family Medicine

## 2008-03-15 ENCOUNTER — Ambulatory Visit: Payer: Self-pay | Admitting: Family Medicine

## 2008-03-20 ENCOUNTER — Encounter: Payer: Self-pay | Admitting: Family Medicine

## 2008-03-21 ENCOUNTER — Encounter: Payer: Self-pay | Admitting: Family Medicine

## 2008-03-26 ENCOUNTER — Ambulatory Visit: Payer: Self-pay | Admitting: Family Medicine

## 2008-03-31 ENCOUNTER — Encounter: Payer: Self-pay | Admitting: Family Medicine

## 2008-04-01 ENCOUNTER — Encounter: Payer: Self-pay | Admitting: Family Medicine

## 2008-05-24 ENCOUNTER — Ambulatory Visit: Payer: Self-pay | Admitting: Family Medicine

## 2008-05-24 DIAGNOSIS — IMO0001 Reserved for inherently not codable concepts without codable children: Secondary | ICD-10-CM

## 2008-05-25 ENCOUNTER — Encounter: Payer: Self-pay | Admitting: Family Medicine

## 2008-05-25 ENCOUNTER — Ambulatory Visit: Payer: Self-pay

## 2008-05-31 ENCOUNTER — Ambulatory Visit: Payer: Self-pay | Admitting: Family Medicine

## 2008-05-31 ENCOUNTER — Encounter (INDEPENDENT_AMBULATORY_CARE_PROVIDER_SITE_OTHER): Payer: Self-pay | Admitting: *Deleted

## 2008-06-01 ENCOUNTER — Encounter (INDEPENDENT_AMBULATORY_CARE_PROVIDER_SITE_OTHER): Payer: Self-pay | Admitting: *Deleted

## 2008-06-01 LAB — CONVERTED CEMR LAB
ALT: 19 units/L (ref 0–35)
Chloride: 108 meq/L (ref 96–112)
Cholesterol: 172 mg/dL (ref 0–200)
GFR calc non Af Amer: 34.83 mL/min (ref >60)
Glucose, Bld: 100 mg/dL — ABNORMAL HIGH (ref 70–99)
HDL: 68.9 mg/dL (ref >39.00)
LDL Cholesterol: 84 mg/dL (ref 0–99)
Potassium: 4.1 meq/L (ref 3.5–5.1)
Sodium: 146 meq/L — ABNORMAL HIGH (ref 135–145)
Total CK: 78 units/L (ref 7–177)
Total Protein: 7.6 g/dL (ref 6.0–8.3)
VLDL: 19.6 mg/dL (ref 0.0–40.0)
Vit D, 25-Hydroxy: 63 ng/mL (ref 30–89)
Vitamin B-12: >1500 pg/mL — ABNORMAL HIGH (ref 211–911)

## 2008-06-04 ENCOUNTER — Encounter (INDEPENDENT_AMBULATORY_CARE_PROVIDER_SITE_OTHER): Payer: Self-pay | Admitting: *Deleted

## 2008-06-21 ENCOUNTER — Ambulatory Visit: Payer: Self-pay | Admitting: Family Medicine

## 2008-06-29 ENCOUNTER — Encounter (INDEPENDENT_AMBULATORY_CARE_PROVIDER_SITE_OTHER): Payer: Self-pay | Admitting: *Deleted

## 2008-06-29 LAB — CONVERTED CEMR LAB
BUN: 23 mg/dL (ref 6–23)
Calcium: 9.4 mg/dL (ref 8.4–10.5)
Creatinine, Ser: 1.3 mg/dL — ABNORMAL HIGH (ref 0.4–1.2)
GFR calc non Af Amer: 41.08 mL/min (ref >60)

## 2008-07-12 ENCOUNTER — Telehealth (INDEPENDENT_AMBULATORY_CARE_PROVIDER_SITE_OTHER): Payer: Self-pay | Admitting: *Deleted

## 2008-07-12 ENCOUNTER — Ambulatory Visit: Payer: Self-pay | Admitting: Family Medicine

## 2008-07-13 ENCOUNTER — Encounter (INDEPENDENT_AMBULATORY_CARE_PROVIDER_SITE_OTHER): Payer: Self-pay | Admitting: *Deleted

## 2008-07-19 ENCOUNTER — Ambulatory Visit: Payer: Self-pay | Admitting: Family Medicine

## 2008-07-23 ENCOUNTER — Encounter (INDEPENDENT_AMBULATORY_CARE_PROVIDER_SITE_OTHER): Payer: Self-pay | Admitting: *Deleted

## 2008-07-23 LAB — CONVERTED CEMR LAB: TSH: 1.12 microintl units/mL (ref 0.35–5.50)

## 2008-07-26 ENCOUNTER — Ambulatory Visit: Payer: Self-pay | Admitting: Family Medicine

## 2008-07-31 ENCOUNTER — Encounter: Payer: Self-pay | Admitting: Family Medicine

## 2008-08-15 ENCOUNTER — Telehealth (INDEPENDENT_AMBULATORY_CARE_PROVIDER_SITE_OTHER): Payer: Self-pay | Admitting: *Deleted

## 2008-08-16 ENCOUNTER — Ambulatory Visit: Payer: Self-pay | Admitting: Family Medicine

## 2008-08-23 ENCOUNTER — Encounter: Admission: RE | Admit: 2008-08-23 | Discharge: 2008-11-12 | Payer: Self-pay | Admitting: Family Medicine

## 2008-08-28 ENCOUNTER — Encounter: Payer: Self-pay | Admitting: Family Medicine

## 2008-09-13 ENCOUNTER — Ambulatory Visit: Payer: Self-pay | Admitting: Family Medicine

## 2008-10-11 ENCOUNTER — Ambulatory Visit: Payer: Self-pay | Admitting: Family Medicine

## 2008-10-12 ENCOUNTER — Telehealth (INDEPENDENT_AMBULATORY_CARE_PROVIDER_SITE_OTHER): Payer: Self-pay | Admitting: *Deleted

## 2008-10-13 LAB — CONVERTED CEMR LAB
Basophils Relative: 0.7 % (ref 0.0–3.0)
CO2: 30 meq/L (ref 19–32)
Calcium: 9.4 mg/dL (ref 8.4–10.5)
Chloride: 107 meq/L (ref 96–112)
Eosinophils Absolute: 0.1 10*3/uL (ref 0.0–0.7)
HCT: 38.6 % (ref 36.0–46.0)
Hemoglobin: 13.2 g/dL (ref 12.0–15.0)
Lymphocytes Relative: 36.6 % (ref 12.0–46.0)
MCHC: 34.1 g/dL (ref 30.0–36.0)
MCV: 97.8 fL (ref 78.0–100.0)
Neutro Abs: 2.7 10*3/uL (ref 1.4–7.7)
Potassium: 3.8 meq/L (ref 3.5–5.1)
RBC: 3.95 M/uL (ref 3.87–5.11)
Saturation Ratios: 29.4 % (ref 20.0–50.0)
Sodium: 145 meq/L (ref 135–145)

## 2008-10-18 ENCOUNTER — Encounter: Payer: Self-pay | Admitting: Family Medicine

## 2008-10-23 ENCOUNTER — Telehealth: Payer: Self-pay | Admitting: Family Medicine

## 2008-10-23 ENCOUNTER — Ambulatory Visit: Payer: Self-pay | Admitting: Family Medicine

## 2008-10-26 DIAGNOSIS — D696 Thrombocytopenia, unspecified: Secondary | ICD-10-CM | POA: Insufficient documentation

## 2008-10-29 LAB — CONVERTED CEMR LAB
Basophils Relative: 0.9 % (ref 0.0–3.0)
Eosinophils Relative: 3 % (ref 0.0–5.0)
HCT: 36 % (ref 36.0–46.0)
Lymphs Abs: 1.6 10*3/uL (ref 0.7–4.0)
MCV: 97.9 fL (ref 78.0–100.0)
Monocytes Absolute: 0.4 10*3/uL (ref 0.1–1.0)
Platelets: 114 10*3/uL — ABNORMAL LOW (ref 150.0–400.0)
WBC: 4.6 10*3/uL (ref 4.5–10.5)

## 2008-10-30 ENCOUNTER — Ambulatory Visit: Payer: Self-pay | Admitting: Hematology & Oncology

## 2008-11-08 ENCOUNTER — Encounter: Payer: Self-pay | Admitting: Family Medicine

## 2008-11-12 ENCOUNTER — Ambulatory Visit: Payer: Self-pay | Admitting: Family Medicine

## 2008-11-12 ENCOUNTER — Encounter (INDEPENDENT_AMBULATORY_CARE_PROVIDER_SITE_OTHER): Payer: Self-pay | Admitting: *Deleted

## 2008-11-14 ENCOUNTER — Telehealth: Payer: Self-pay | Admitting: Family Medicine

## 2008-11-26 ENCOUNTER — Encounter: Payer: Self-pay | Admitting: Cardiology

## 2008-11-26 LAB — CBC WITH DIFFERENTIAL (CANCER CENTER ONLY)
BASO#: 0 10*3/uL (ref 0.0–0.2)
EOS%: 2.5 % (ref 0.0–7.0)
Eosinophils Absolute: 0.2 10*3/uL (ref 0.0–0.5)
HCT: 38.3 % (ref 34.8–46.6)
HGB: 13 g/dL (ref 11.6–15.9)
LYMPH%: 41.9 % (ref 14.0–48.0)
MCH: 32.3 pg (ref 26.0–34.0)
MCHC: 33.9 g/dL (ref 32.0–36.0)
MCV: 95 fL (ref 81–101)
MONO%: 5.9 % (ref 0.0–13.0)
NEUT#: 3 10*3/uL (ref 1.5–6.5)
NEUT%: 49 % (ref 39.6–80.0)
RBC: 4.01 10*6/uL (ref 3.70–5.32)

## 2008-11-26 LAB — CHCC SATELLITE - SMEAR

## 2008-11-28 LAB — PROTEIN ELECTROPHORESIS, SERUM
Gamma Globulin: 11.4 % (ref 11.1–18.8)
Total Protein, Serum Electrophoresis: 7.6 g/dL (ref 6.0–8.3)

## 2008-11-28 LAB — VITAMIN D 25 HYDROXY (VIT D DEFICIENCY, FRACTURES): Vit D, 25-Hydroxy: 63 ng/mL (ref 30–89)

## 2008-11-28 LAB — FERRITIN: Ferritin: 21 ng/mL (ref 10–291)

## 2008-12-02 ENCOUNTER — Encounter: Payer: Self-pay | Admitting: Cardiology

## 2008-12-02 DIAGNOSIS — I359 Nonrheumatic aortic valve disorder, unspecified: Secondary | ICD-10-CM | POA: Insufficient documentation

## 2008-12-03 ENCOUNTER — Ambulatory Visit: Payer: Self-pay | Admitting: Cardiology

## 2008-12-03 DIAGNOSIS — R0602 Shortness of breath: Secondary | ICD-10-CM | POA: Insufficient documentation

## 2008-12-13 ENCOUNTER — Ambulatory Visit: Payer: Self-pay | Admitting: Family Medicine

## 2008-12-17 ENCOUNTER — Ambulatory Visit: Payer: Self-pay | Admitting: Cardiology

## 2008-12-17 ENCOUNTER — Ambulatory Visit: Payer: Self-pay

## 2008-12-17 ENCOUNTER — Encounter: Payer: Self-pay | Admitting: Cardiology

## 2008-12-17 ENCOUNTER — Ambulatory Visit (HOSPITAL_COMMUNITY): Admission: RE | Admit: 2008-12-17 | Discharge: 2008-12-17 | Payer: Self-pay | Admitting: Cardiology

## 2008-12-24 ENCOUNTER — Ambulatory Visit: Payer: Self-pay | Admitting: Cardiology

## 2009-01-14 ENCOUNTER — Ambulatory Visit: Payer: Self-pay | Admitting: Family Medicine

## 2009-01-14 DIAGNOSIS — H3530 Unspecified macular degeneration: Secondary | ICD-10-CM | POA: Insufficient documentation

## 2009-02-13 ENCOUNTER — Ambulatory Visit: Payer: Self-pay | Admitting: Family Medicine

## 2009-02-13 DIAGNOSIS — R0609 Other forms of dyspnea: Secondary | ICD-10-CM

## 2009-02-13 DIAGNOSIS — R0989 Other specified symptoms and signs involving the circulatory and respiratory systems: Secondary | ICD-10-CM | POA: Insufficient documentation

## 2009-02-14 ENCOUNTER — Ambulatory Visit: Payer: Self-pay | Admitting: Family Medicine

## 2009-02-15 ENCOUNTER — Telehealth (INDEPENDENT_AMBULATORY_CARE_PROVIDER_SITE_OTHER): Payer: Self-pay | Admitting: *Deleted

## 2009-02-15 ENCOUNTER — Encounter: Payer: Self-pay | Admitting: Family Medicine

## 2009-02-15 ENCOUNTER — Ambulatory Visit: Payer: Self-pay | Admitting: Internal Medicine

## 2009-02-20 ENCOUNTER — Telehealth (INDEPENDENT_AMBULATORY_CARE_PROVIDER_SITE_OTHER): Payer: Self-pay | Admitting: *Deleted

## 2009-02-21 ENCOUNTER — Telehealth: Payer: Self-pay | Admitting: Family Medicine

## 2009-02-27 ENCOUNTER — Telehealth (INDEPENDENT_AMBULATORY_CARE_PROVIDER_SITE_OTHER): Payer: Self-pay | Admitting: *Deleted

## 2009-03-04 ENCOUNTER — Telehealth (INDEPENDENT_AMBULATORY_CARE_PROVIDER_SITE_OTHER): Payer: Self-pay | Admitting: *Deleted

## 2009-03-15 ENCOUNTER — Ambulatory Visit: Payer: Self-pay | Admitting: Family Medicine

## 2009-03-15 LAB — CONVERTED CEMR LAB
Protein, U semiquant: NEGATIVE
Urobilinogen, UA: 0.2
WBC Urine, dipstick: NEGATIVE

## 2009-03-18 ENCOUNTER — Ambulatory Visit: Payer: Self-pay | Admitting: Cardiovascular Disease

## 2009-03-18 DIAGNOSIS — N281 Cyst of kidney, acquired: Secondary | ICD-10-CM

## 2009-03-18 DIAGNOSIS — K5732 Diverticulitis of large intestine without perforation or abscess without bleeding: Secondary | ICD-10-CM

## 2009-03-18 LAB — CONVERTED CEMR LAB
BUN: 32 mg/dL — ABNORMAL HIGH (ref 6–23)
Chloride: 104 meq/L (ref 96–112)
Eosinophils Relative: 3 % (ref 0–5)
Free T4: 1.49 ng/dL (ref 0.80–1.80)
Glucose, Bld: 95 mg/dL (ref 70–99)
HCT: 38.9 % (ref 36.0–46.0)
Hemoglobin: 12.4 g/dL (ref 12.0–15.0)
Indirect Bilirubin: 0.2 mg/dL (ref 0.0–0.9)
LDL Cholesterol: 79 mg/dL (ref 0–99)
Lymphocytes Relative: 27 % (ref 12–46)
Lymphs Abs: 1.6 10*3/uL (ref 0.7–4.0)
Monocytes Absolute: 0.4 10*3/uL (ref 0.1–1.0)
Potassium: 4.8 meq/L (ref 3.5–5.3)
Saturation Ratios: 16 % — ABNORMAL LOW (ref 20–55)
TSH: 0.396 microintl units/mL (ref 0.350–4.500)
Total CHOL/HDL Ratio: 2.3
Total Protein: 7.2 g/dL (ref 6.0–8.3)
Triglycerides: 75 mg/dL (ref <150)
VLDL: 15 mg/dL (ref 0–40)
Vitamin B-12: >2000 pg/mL — ABNORMAL HIGH (ref 211–911)
WBC: 5.8 10*3/uL (ref 4.0–10.5)

## 2009-03-19 ENCOUNTER — Encounter: Payer: Self-pay | Admitting: Family Medicine

## 2009-03-19 ENCOUNTER — Telehealth: Payer: Self-pay | Admitting: Family Medicine

## 2009-03-19 ENCOUNTER — Telehealth (INDEPENDENT_AMBULATORY_CARE_PROVIDER_SITE_OTHER): Payer: Self-pay | Admitting: *Deleted

## 2009-03-21 ENCOUNTER — Telehealth: Payer: Self-pay | Admitting: Family Medicine

## 2009-03-21 ENCOUNTER — Encounter (INDEPENDENT_AMBULATORY_CARE_PROVIDER_SITE_OTHER): Payer: Self-pay | Admitting: *Deleted

## 2009-03-27 ENCOUNTER — Telehealth (INDEPENDENT_AMBULATORY_CARE_PROVIDER_SITE_OTHER): Payer: Self-pay | Admitting: *Deleted

## 2009-03-28 ENCOUNTER — Ambulatory Visit: Payer: Self-pay | Admitting: Family Medicine

## 2009-04-10 ENCOUNTER — Ambulatory Visit: Payer: Self-pay | Admitting: Cardiology

## 2009-04-10 ENCOUNTER — Ambulatory Visit: Payer: Self-pay | Admitting: Family Medicine

## 2009-04-10 DIAGNOSIS — I4891 Unspecified atrial fibrillation: Secondary | ICD-10-CM | POA: Insufficient documentation

## 2009-04-11 ENCOUNTER — Encounter (INDEPENDENT_AMBULATORY_CARE_PROVIDER_SITE_OTHER): Payer: Self-pay | Admitting: *Deleted

## 2009-04-12 ENCOUNTER — Encounter: Payer: Self-pay | Admitting: Cardiology

## 2009-04-12 ENCOUNTER — Ambulatory Visit (HOSPITAL_COMMUNITY): Admission: RE | Admit: 2009-04-12 | Discharge: 2009-04-12 | Payer: Self-pay | Admitting: Cardiology

## 2009-04-12 ENCOUNTER — Ambulatory Visit: Payer: Self-pay | Admitting: Cardiovascular Disease

## 2009-04-12 ENCOUNTER — Ambulatory Visit: Payer: Self-pay | Admitting: Cardiology

## 2009-04-12 ENCOUNTER — Ambulatory Visit: Payer: Self-pay

## 2009-04-15 ENCOUNTER — Encounter: Payer: Self-pay | Admitting: Cardiology

## 2009-04-16 ENCOUNTER — Ambulatory Visit: Payer: Self-pay | Admitting: Cardiology

## 2009-04-18 LAB — CONVERTED CEMR LAB
CO2: 32 meq/L (ref 19–32)
Calcium: 9.3 mg/dL (ref 8.4–10.5)
Creatinine, Ser: 1.6 mg/dL — ABNORMAL HIGH (ref 0.4–1.2)

## 2009-04-29 ENCOUNTER — Encounter: Payer: Self-pay | Admitting: Family Medicine

## 2009-05-03 ENCOUNTER — Ambulatory Visit: Payer: Self-pay | Admitting: Cardiology

## 2009-05-03 DIAGNOSIS — R609 Edema, unspecified: Secondary | ICD-10-CM | POA: Insufficient documentation

## 2009-05-27 ENCOUNTER — Ambulatory Visit: Payer: Self-pay | Admitting: Family Medicine

## 2009-06-03 ENCOUNTER — Ambulatory Visit: Payer: Self-pay | Admitting: Family Medicine

## 2009-06-06 ENCOUNTER — Encounter (INDEPENDENT_AMBULATORY_CARE_PROVIDER_SITE_OTHER): Payer: Self-pay | Admitting: *Deleted

## 2009-06-06 ENCOUNTER — Ambulatory Visit: Payer: Self-pay | Admitting: Family Medicine

## 2009-06-06 DIAGNOSIS — K219 Gastro-esophageal reflux disease without esophagitis: Secondary | ICD-10-CM

## 2009-06-06 LAB — CONVERTED CEMR LAB
Basophils Absolute: 0 10*3/uL (ref 0.0–0.1)
Eosinophils Absolute: 0.2 10*3/uL (ref 0.0–0.7)
Folate: >20 ng/mL
Hemoglobin: 13 g/dL (ref 12.0–15.0)
Lymphocytes Relative: 41.6 % (ref 12.0–46.0)
Lymphs Abs: 2.7 10*3/uL (ref 0.7–4.0)
MCHC: 33.5 g/dL (ref 30.0–36.0)
Neutro Abs: 3 10*3/uL (ref 1.4–7.7)
RDW: 14.9 % — ABNORMAL HIGH (ref 11.5–14.6)
Transferrin: 328 mg/dL (ref 212.0–360.0)

## 2009-06-07 ENCOUNTER — Encounter (INDEPENDENT_AMBULATORY_CARE_PROVIDER_SITE_OTHER): Payer: Self-pay | Admitting: *Deleted

## 2009-06-10 ENCOUNTER — Ambulatory Visit: Payer: Self-pay | Admitting: Family Medicine

## 2009-06-13 ENCOUNTER — Ambulatory Visit: Payer: Self-pay | Admitting: Cardiology

## 2009-06-17 ENCOUNTER — Ambulatory Visit: Payer: Self-pay | Admitting: Family Medicine

## 2009-06-17 ENCOUNTER — Telehealth (INDEPENDENT_AMBULATORY_CARE_PROVIDER_SITE_OTHER): Payer: Self-pay | Admitting: *Deleted

## 2009-06-17 IMAGING — CT CT CHEST W/O CM
2 of 6 series · 14 of 36 positions shown, 17 images · non-contrast
Comparison: CT chest 09/15/2007 and CT chest abdomen pelvis
07/02/2004

CT CHEST

CLINICAL DATA: Followup right lower lobe lesions.  Indeterminate
left renal lesion on previous CT.

CT CHEST AND ABDOMEN WITHOUT CONTRAST
TECHNIQUE: Multidetector CT imaging of the chest and abdomen was p
erformed following the standard
protocol without intravenous contrast.

[Series 2: chest_routine 5.0 b40f st · axial · 0.73mm/px · z∈[-323,-28]mm · 12 of 65 slices shown, 15 images]
[im 3/65  mediastinal]
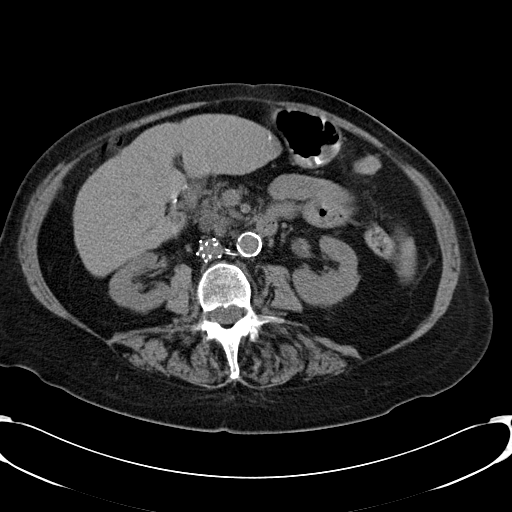
[im 3/65  lung]
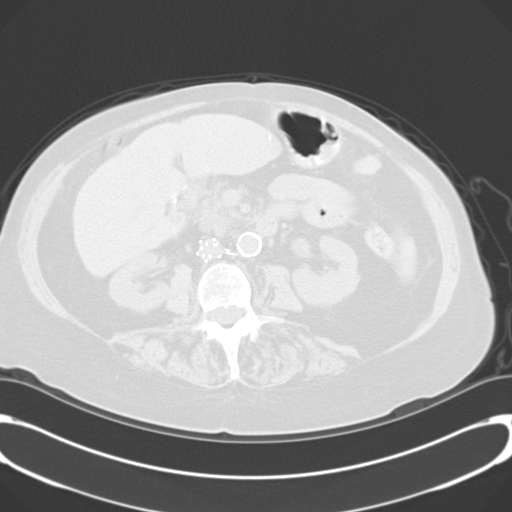
[im 9/65  lung]
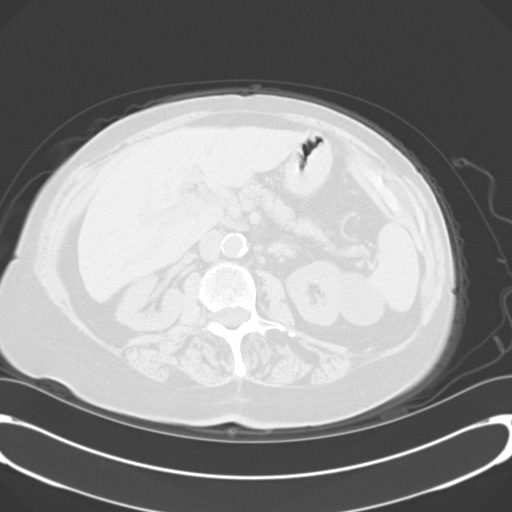
[im 15/65  lung]
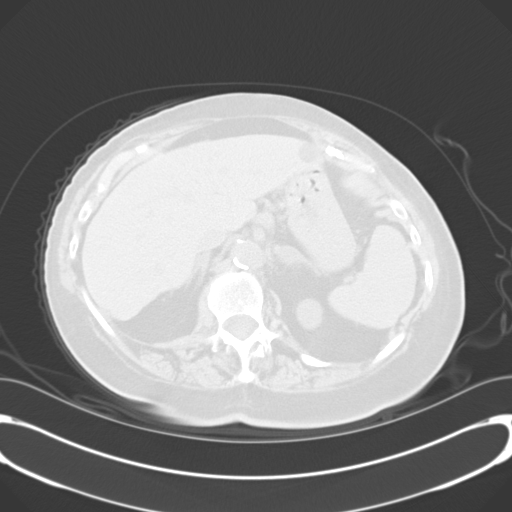
[im 21/65  lung]
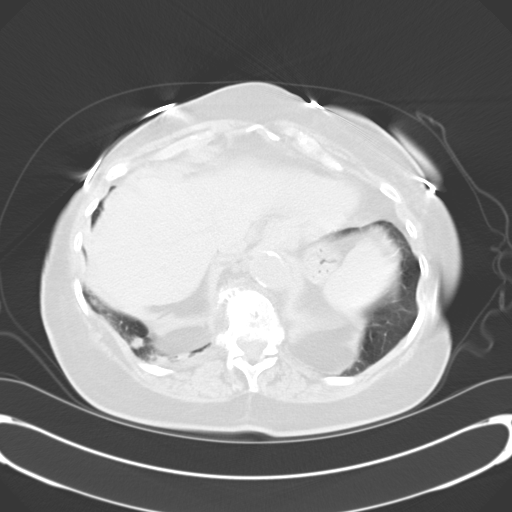
[im 24/65  mediastinal]
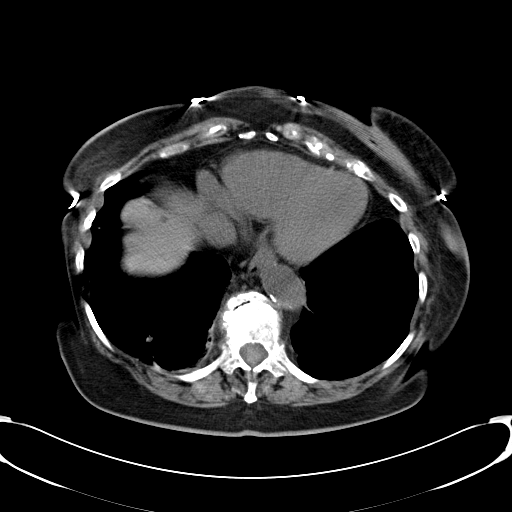
[im 24/65  lung]
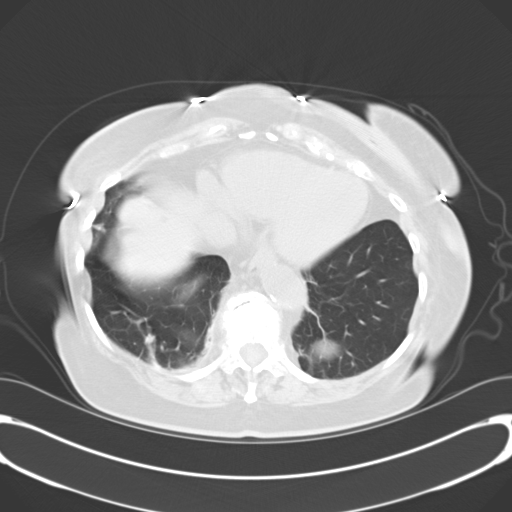
[im 30/65  lung]
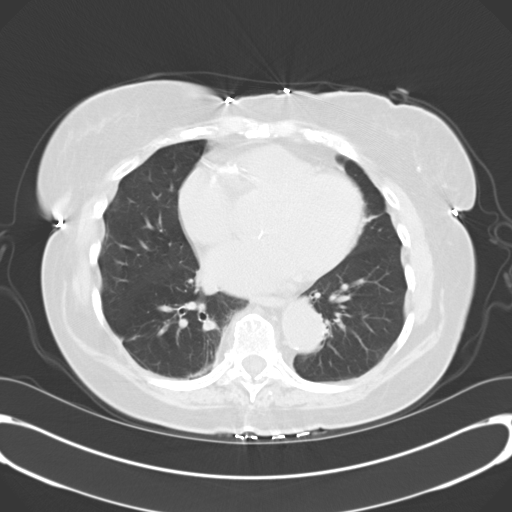
[im 35/65  lung]
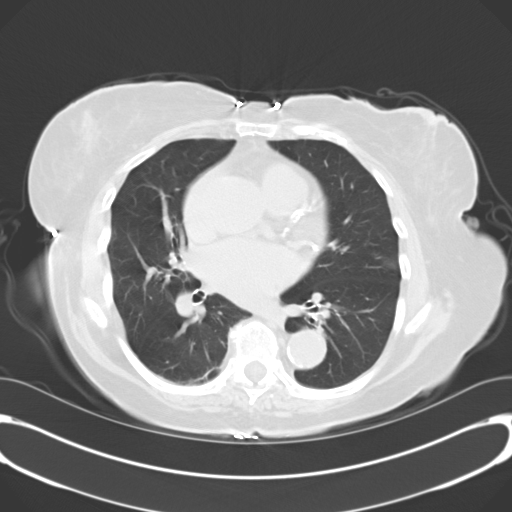
[im 41/65  lung]
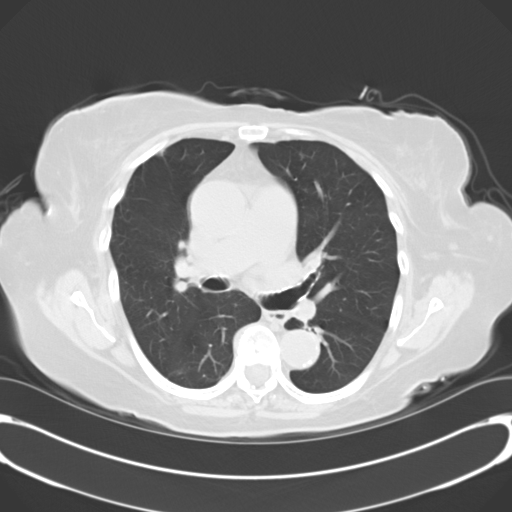
[im 44/65  mediastinal]
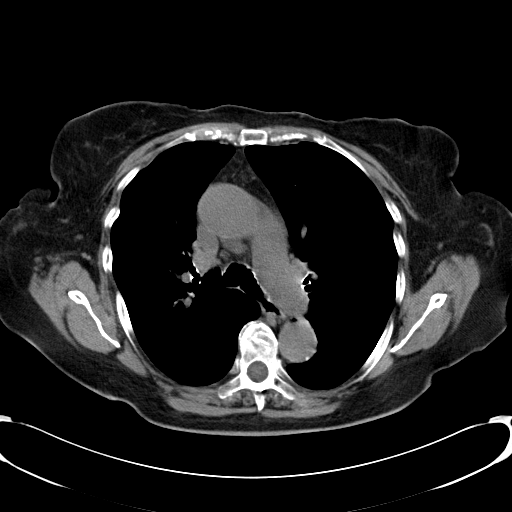
[im 44/65  lung]
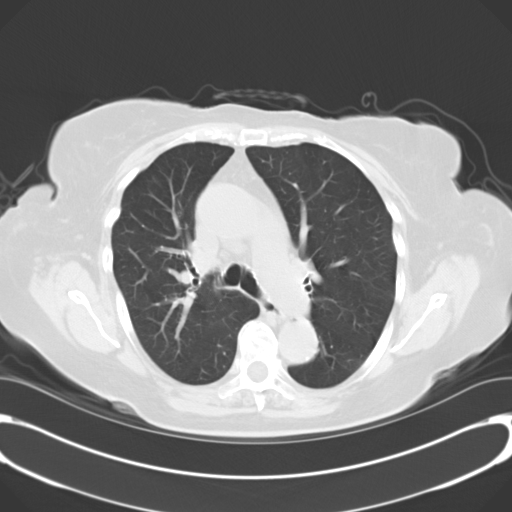
[im 50/65  lung]
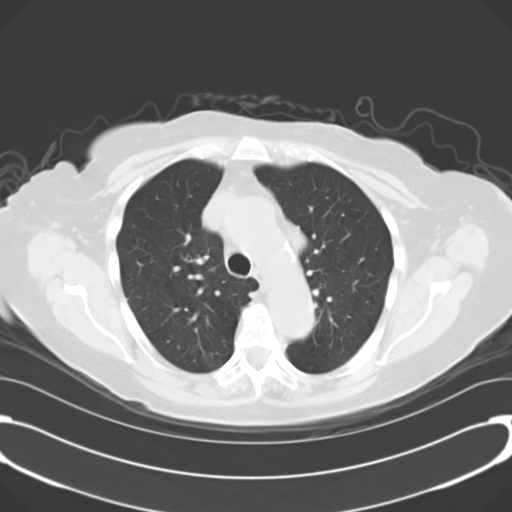
[im 56/65  lung]
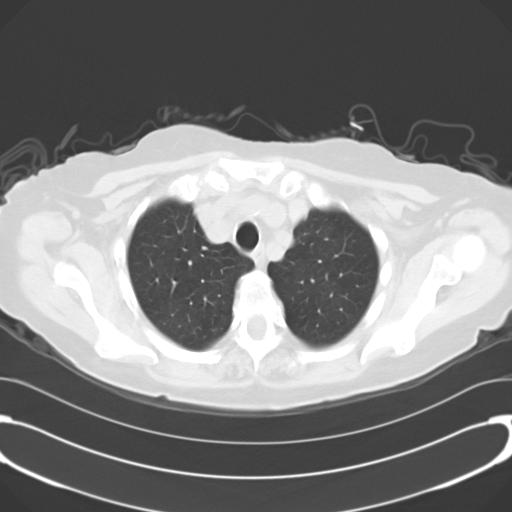
[im 62/65  lung]
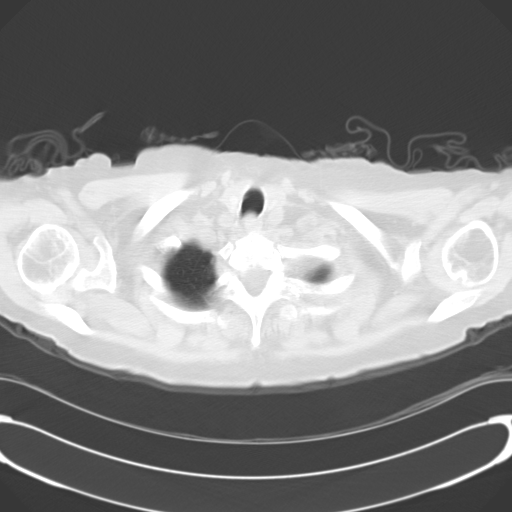

[Series 602: <mpr range> · coronal · 0.73mm/px · 2 of 116 slices shown]
[im 39/116  lung]
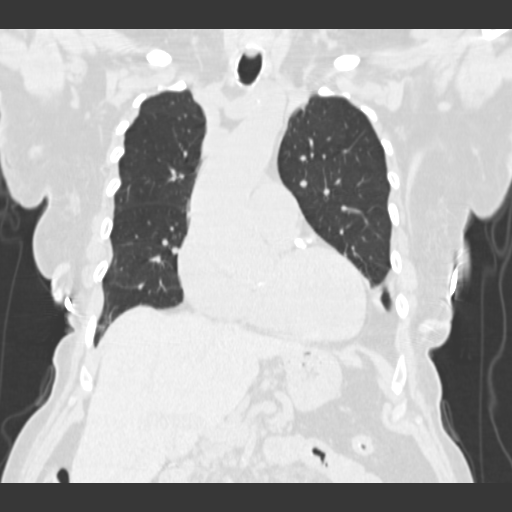
[im 77/116  lung]
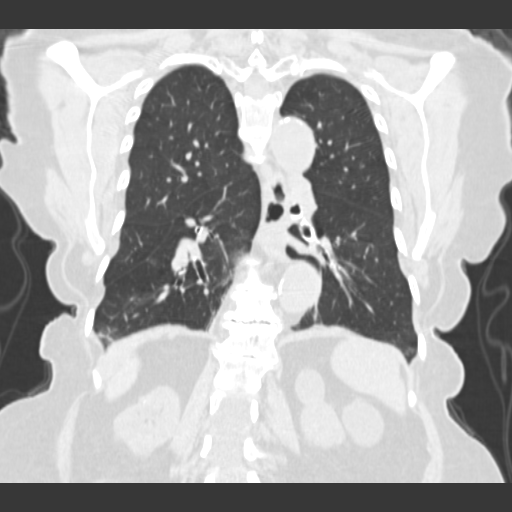

[14 of 36 positions shown; findings below may reference images not displayed]

FINDINGS: Ascending aorta measures approximately 3.9 cm.  No
pathologically enlarged mediastinal, hilar or axillary lymph nodes.
Pulmonary arteries are enlarged.  There is coronary artery
calcification.  Heart is enlarged.  No pericardial effusion.  Small
hiatal hernia.

There has been interval improvement in rounded air space
consolidation in the right lower lobe.  Volume loss is seen in the
right lower lobe.  Scattered scarring bilaterally.  Airway
unremarkable.  No pleural fluid.
IMPRESSION: 1.  Interval improvement in rounded air space consolidation in the
right lower lobe, with probable residual scarring.
2.  Mildly prominent ascending aorta.

CT ABDOMEN
FINDINGS: Low attenuation lesions in the liver measure up to
cm in the left hepatic lobe, stable. Cholecystectomy.  Right
adrenal gland unremarkable.  Left adrenal nodules measure up to
x 1.5 cm and 6 HU, stable.  A 1.2 cm lesion in the interpolar right
kidney measures 27 HU, and may be slightly larger than on
07/02/2004.  Low attenuation lesions in the left kidney measure up
to 4.1 cm and 12 HU, consistent with cysts.  There are smaller
lesions in the left kidney which are difficult to definitively
characterize due to size.

Spleen, pancreas, stomach and visualized bowel are unremarkable.
Atherosclerotic calcification of the arterial vasculature.
Inferior vena cava filter is in place.  No worrisome lytic or
sclerotic lesions.
IMPRESSION: 1.  Intermediate density lesion in the right kidney cannot be
characterized as a cyst, and may have increased slightly in size
from 07/02/2004.  MR would be helpful in further evaluation, as
clinically indicated.
2.  Left adrenal adenomas.

## 2009-06-25 ENCOUNTER — Ambulatory Visit: Payer: Self-pay | Admitting: Family Medicine

## 2009-06-27 ENCOUNTER — Ambulatory Visit: Payer: Self-pay | Admitting: Cardiology

## 2009-07-09 ENCOUNTER — Ambulatory Visit: Payer: Self-pay | Admitting: Cardiology

## 2009-07-10 ENCOUNTER — Encounter: Payer: Self-pay | Admitting: Internal Medicine

## 2009-07-18 ENCOUNTER — Telehealth: Payer: Self-pay | Admitting: Cardiology

## 2009-07-18 ENCOUNTER — Ambulatory Visit: Payer: Self-pay | Admitting: Family Medicine

## 2009-07-25 ENCOUNTER — Telehealth: Payer: Self-pay | Admitting: Cardiology

## 2009-07-31 ENCOUNTER — Telehealth: Payer: Self-pay | Admitting: Cardiology

## 2009-07-31 ENCOUNTER — Encounter: Admission: RE | Admit: 2009-07-31 | Discharge: 2009-09-30 | Payer: Self-pay | Admitting: Orthopedic Surgery

## 2009-08-05 ENCOUNTER — Encounter: Payer: Self-pay | Admitting: Family Medicine

## 2009-08-08 ENCOUNTER — Ambulatory Visit: Payer: Self-pay | Admitting: Cardiology

## 2009-08-15 ENCOUNTER — Telehealth: Payer: Self-pay | Admitting: Cardiology

## 2009-08-16 ENCOUNTER — Ambulatory Visit: Payer: Self-pay | Admitting: Family Medicine

## 2009-08-16 LAB — CONVERTED CEMR LAB
Bilirubin Urine: NEGATIVE
Blood in Urine, dipstick: NEGATIVE
Ketones, urine, test strip: NEGATIVE
Protein, U semiquant: NEGATIVE
Urobilinogen, UA: 0.2

## 2009-08-20 DIAGNOSIS — N259 Disorder resulting from impaired renal tubular function, unspecified: Secondary | ICD-10-CM | POA: Insufficient documentation

## 2009-08-23 LAB — CONVERTED CEMR LAB
CO2: 29 meq/L (ref 19–32)
Chloride: 106 meq/L (ref 96–112)
Glucose, Bld: 91 mg/dL (ref 70–99)
Potassium: 3.9 meq/L (ref 3.5–5.1)
Sodium: 145 meq/L (ref 135–145)

## 2009-09-13 ENCOUNTER — Ambulatory Visit: Payer: Self-pay | Admitting: Family Medicine

## 2009-09-13 ENCOUNTER — Encounter (INDEPENDENT_AMBULATORY_CARE_PROVIDER_SITE_OTHER): Payer: Self-pay | Admitting: *Deleted

## 2009-10-07 ENCOUNTER — Encounter: Payer: Self-pay | Admitting: Family Medicine

## 2009-10-11 ENCOUNTER — Ambulatory Visit: Payer: Self-pay | Admitting: Family Medicine

## 2009-10-14 ENCOUNTER — Encounter: Payer: Self-pay | Admitting: Cardiology

## 2009-10-15 ENCOUNTER — Ambulatory Visit: Payer: Self-pay | Admitting: Cardiology

## 2009-11-08 ENCOUNTER — Ambulatory Visit: Payer: Self-pay | Admitting: Family Medicine

## 2009-12-06 ENCOUNTER — Ambulatory Visit: Payer: Self-pay | Admitting: Family Medicine

## 2009-12-06 LAB — CONVERTED CEMR LAB
Blood in Urine, dipstick: NEGATIVE
Nitrite: NEGATIVE
Specific Gravity, Urine: >=1.03
Urobilinogen, UA: 0.2

## 2009-12-09 LAB — CONVERTED CEMR LAB
Albumin: 4.8 g/dL (ref 3.5–5.2)
Basophils Absolute: 0 10*3/uL (ref 0.0–0.1)
Basophils Relative: 1 % (ref 0–1)
Chloride: 104 meq/L (ref 96–112)
Folate: >20 ng/mL
HDL: 58 mg/dL (ref >39)
LDL Cholesterol: 118 mg/dL — ABNORMAL HIGH (ref 0–99)
Lymphocytes Relative: 26 % (ref 12–46)
MCHC: 31.9 g/dL (ref 30.0–36.0)
Neutro Abs: 5.2 10*3/uL (ref 1.7–7.7)
Neutrophils Relative %: 65 % (ref 43–77)
Potassium: 4.4 meq/L (ref 3.5–5.3)
RBC: 4.22 M/uL (ref 3.87–5.11)
RDW: 14.4 % (ref 11.5–15.5)
TSH: 1.156 microintl units/mL (ref 0.350–4.500)
Total CHOL/HDL Ratio: 4.1
Total Protein: 7.5 g/dL (ref 6.0–8.3)
Triglycerides: 297 mg/dL — ABNORMAL HIGH (ref <150)
VLDL: 59 mg/dL — ABNORMAL HIGH (ref 0–40)
Vitamin B-12: >2000 pg/mL — ABNORMAL HIGH (ref 211–911)

## 2009-12-19 ENCOUNTER — Telehealth: Payer: Self-pay | Admitting: Family Medicine

## 2009-12-23 ENCOUNTER — Telehealth: Payer: Self-pay | Admitting: Family Medicine

## 2009-12-24 ENCOUNTER — Ambulatory Visit: Payer: Self-pay | Admitting: Family Medicine

## 2009-12-24 DIAGNOSIS — B354 Tinea corporis: Secondary | ICD-10-CM | POA: Insufficient documentation

## 2009-12-31 ENCOUNTER — Telehealth: Payer: Self-pay | Admitting: Family Medicine

## 2010-01-06 ENCOUNTER — Ambulatory Visit: Payer: Self-pay | Admitting: Family Medicine

## 2010-01-06 ENCOUNTER — Telehealth (INDEPENDENT_AMBULATORY_CARE_PROVIDER_SITE_OTHER): Payer: Self-pay | Admitting: *Deleted

## 2010-01-07 ENCOUNTER — Encounter: Payer: Self-pay | Admitting: Family Medicine

## 2010-01-07 ENCOUNTER — Telehealth: Payer: Self-pay | Admitting: Family Medicine

## 2010-01-16 ENCOUNTER — Ambulatory Visit: Payer: Self-pay | Admitting: Cardiology

## 2010-02-13 ENCOUNTER — Ambulatory Visit
Admission: RE | Admit: 2010-02-13 | Discharge: 2010-02-13 | Payer: Self-pay | Source: Home / Self Care | Attending: Family Medicine | Admitting: Family Medicine

## 2010-03-02 ENCOUNTER — Encounter: Payer: Self-pay | Admitting: Family Medicine

## 2010-03-02 ENCOUNTER — Encounter: Payer: Self-pay | Admitting: Podiatry

## 2010-03-05 ENCOUNTER — Encounter: Payer: Self-pay | Admitting: Family Medicine

## 2010-03-09 LAB — CONVERTED CEMR LAB
ALT: 18 units/L (ref 0–35)
AST: 32 units/L (ref 0–37)
Albumin: 3.6 g/dL (ref 3.5–5.2)
BUN: 23 mg/dL (ref 6–23)
BUN: 33 mg/dL — ABNORMAL HIGH (ref 6–23)
BUN: 34 mg/dL — ABNORMAL HIGH (ref 6–23)
BUN: 36 mg/dL — ABNORMAL HIGH (ref 6–23)
Basophils Absolute: 0 10*3/uL (ref 0.0–0.1)
Basophils Absolute: 0 10*3/uL (ref 0.0–0.1)
Basophils Relative: 0 % (ref 0.0–3.0)
Basophils Relative: 0.7 % (ref 0.0–1.0)
CO2: 29 meq/L (ref 19–32)
CO2: 31 meq/L (ref 19–32)
CO2: 35 meq/L — ABNORMAL HIGH (ref 19–32)
Calcium: 10.3 mg/dL (ref 8.4–10.5)
Calcium: 9.2 mg/dL (ref 8.4–10.5)
Calcium: 9.5 mg/dL (ref 8.4–10.5)
Chloride: 103 meq/L (ref 96–112)
Chloride: 105 meq/L (ref 96–112)
Chloride: 96 meq/L (ref 96–112)
Creatinine, Ser: 1.4 mg/dL — ABNORMAL HIGH (ref 0.4–1.2)
Creatinine, Ser: 1.5 mg/dL — ABNORMAL HIGH (ref 0.4–1.2)
Eosinophils Absolute: 0.1 10*3/uL (ref 0.0–0.7)
Eosinophils Absolute: 0.1 10*3/uL (ref 0.0–0.7)
Eosinophils Relative: 0.7 % (ref 0.0–5.0)
Eosinophils Relative: 2.6 % (ref 0.0–5.0)
Free T4: 1.3 ng/dL (ref 0.6–1.6)
GFR calc Af Amer: 42 mL/min
GFR calc Af Amer: 46 mL/min
GFR calc non Af Amer: 32 mL/min
Glucose, Bld: 105 mg/dL — ABNORMAL HIGH (ref 70–99)
Glucose, Bld: 78 mg/dL (ref 70–99)
Glucose, Bld: 92 mg/dL (ref 70–99)
Glucose, Bld: 94 mg/dL (ref 70–99)
HCT: 35.5 % — ABNORMAL LOW (ref 36.0–46.0)
Hemoglobin: 12.1 g/dL (ref 12.0–15.0)
Lymphocytes Relative: 30.9 % (ref 12.0–46.0)
Lymphs Abs: 1.7 10*3/uL (ref 0.7–4.0)
MCHC: 34.1 g/dL (ref 30.0–36.0)
MCV: 96.5 fL (ref 78.0–100.0)
Monocytes Absolute: 0.4 10*3/uL (ref 0.1–1.0)
Monocytes Absolute: 0.5 10*3/uL (ref 0.1–1.0)
Monocytes Relative: 4.3 % (ref 3.0–11.0)
Neutro Abs: 2.3 10*3/uL (ref 1.4–7.7)
Neutro Abs: 3.3 10*3/uL (ref 1.4–7.7)
Neutrophils Relative %: 43.6 % (ref 43.0–77.0)
Neutrophils Relative %: 57.4 % (ref 43.0–77.0)
Platelets: 147 10*3/uL — ABNORMAL LOW (ref 150.0–400.0)
Platelets: 166 10*3/uL (ref 150–400)
RBC: 3.59 M/uL — ABNORMAL LOW (ref 3.87–5.11)
RBC: 3.68 M/uL — ABNORMAL LOW (ref 3.87–5.11)
RBC: 4.1 M/uL (ref 3.87–5.11)
RDW: 14.2 % (ref 11.5–14.6)
Saturation Ratios: 14.2 % — ABNORMAL LOW (ref 20.0–50.0)
Sodium: 144 meq/L (ref 135–145)
Total Protein: 7.9 g/dL (ref 6.0–8.3)
Transferrin: 301.4 mg/dL (ref >=212.0)
WBC: 5.3 10*3/uL (ref 4.5–10.5)
WBC: 5.6 10*3/uL (ref 4.5–10.5)
WBC: 7.3 10*3/uL (ref 4.5–10.5)

## 2010-03-13 NOTE — Assessment & Plan Note (Signed)
Summary: per check out/sf   Visit Type:  Follow-up Referring Provider:  Rafael Bihari Primary Provider:  Loreen Freud DO  CC:  shortness of breath.  History of Present Illness: The patient is seen today for followup of shortness of breath.  She has seen Dr.Powell.  He suggested that we might change her Lasix to 60 mg daily from an alternating dosage he was taking before.  She tells me that when she feels short of breath she does feel increased heart rate.  She had a Holter monitor and we did not see marked tachycardia.  There was slight bradycardia.  At that time I decided to put her digoxin on hold.  Current Medications (verified): 1)  Synthroid 88 Mcg Tabs (Levothyroxine Sodium) .... Once Daily 2)  Furosemide 40 Mg  Tabs (Furosemide) .... Take 1 Tablet 5 Days A Week and 2 Tablets 2 X A Week 3)  Nexium 40 Mg Cpdr (Esomeprazole Magnesium) .Marland Kitchen.. 1 By Mouth Two Times A Day 4)  Metoprolol Succinate 25 Mg Xr24h-Tab (Metoprolol Succinate) .... Take One Tablet By Mouth Daily 5)  Calcium Carbonate-Vitamin D 600-400 Mg-Unit  Tabs (Calcium Carbonate-Vitamin D) .... Once Daily 6)  Vitamin B Complex-C   Caps (B Complex-C) .... Once Daily 7)  Amlodipine Besylate 5 Mg Tabs (Amlodipine Besylate) .... Take One Tablet By Mouth Daily 8)  Diltiazem Hcl Er Beads 240 Mg Xr24h-Cap (Diltiazem Hcl Er Beads) .Marland Kitchen.. 1 By Mouth Daily 9)  Osteo Bi-Flex Adv Triple St  Tabs (Misc Natural Products) .... Take 1 Tablet By Mouth Once A Day 10)  Multivitamins   Tabs (Multiple Vitamin) .... W/ Iron --- Once Daily 11)  Melatonin 5 Mg Tabs (Melatonin) .... Take 1 Tablet By Mouth Once A Day 12)  Acidophilus Probiotic Blend  Caps (Probiotic Product) .Marland Kitchen.. 1 By Mouth Daily 13)  Acetaminophen 325 Mg  Tabs (Acetaminophen) .... As Needed  Allergies (verified): 1)  ! Cipro (Ciprofloxacin Hcl)  Past History:  Past Medical History:  Osteopenia  URI (ICD-465.9) OSTEOPENIA (ICD-733.90) CHEST XRAY, ABNORMAL  (ICD-793.1) CERVICAL CANCER, HX OF (ICD-V10.41) B12 DEFICIENCY (ICD-266.2) BENIGN NEOPLASM OF ADRENAL GLAND (ICD-227.0) HYPOTHYROIDISM (ICD-244.9) URINARY INCONTINENCE (ICD-788.30) OSTEOARTHRITIS (ICD-715.90) GERD (ICD-530.81) COPD (ICD-496) EFFUSION, PLEURAL (ICD-511.9) CHEST PAIN (ICD-786.50)....negative Cardiolite in the past..... ANEMIA, B12 DEFICIENCY (ICD-281.1) FAMILY HISTORY DIABETES 1ST DEGREE RELATIVE (ICD-V18.0) FAMILY HISTORY OF CAD FEMALE 1ST DEGREE RELATIVE <60 (ICD-V16.49) FAMILY HISTORY BREAST CANCER 1ST DEGREE RELATIVE <50 (ICD-V16.3) FAMILY HISTORY OF ANEURYSM AORTIC (ICD-V17.4) BREAST CYST, LEFT (ICD-610.0) FOOT SURGERY, HX OF (ICD-V15.89) HEMORRHAGE, SUBARACHNOID (ICD-430)... therefore no Coumadin...or aspirin.. Dr. Dutch Quint neurosurgery .. aneurysm.. no treatment EF        65%  / EF 60%... echo.... November, 2010  /   55-60%...echo..04/12/2009 Aortic insufficiency... mild..echo... 2007 /   mild...echo...04/12/2009 Mitral regurgitation... mild  /  mild... echo... November, 2010 /  mild...echo...04/12/2009 Aortic root dilatation... mild  /  4.5 cm... stable... echo... November, 2010 /  no mention....echo...04/12/2009 DIZZINESS (ICD-780.4) ANEMIA NOS (ICD-285.9) RENAL INSUFFICIENCY (ICD-588.9) PULMONARY EMBOLISM, HX OF (ICD-V12.51).Marland Kitchenand DVT.Marland Kitchen no Coumadin because of subarachnoid hemorrhage HYPERTENSION (ICD-401.9) HYPERLIPIDEMIA (ICD-272.4) DVT, HX OF (ICD-V12.51) CONGESTIVE HEART FAILURE (ICD-428.0) Atrial fibrillation...noted early 2011..can not take coumadin.. meds adjusted for rate. / Holter monitor Jun 27, 2009.. no rapid heart rate.... mild bradycardia at times.... digoxin stopped Jul 08, 2009  /   September, 2011.... shortness of breath with palpitations when walking... decision to resume low-dose digoxin    Review of Systems  Patient denies fever, chills, headache, sweats, rash, change in vision, change in hearing, chest pain, cough, nausea or vomiting, urinary  symptoms.  All of the systems are reviewed and are negative.  Vital Signs:  Patient profile:   77 year old female Height:      63 inches Weight:      154 pounds BMI:     27.38 Pulse rate:   65 / minute BP sitting:   132 / 80  (left arm) Cuff size:   regular  Vitals Entered By: Hardin Negus, RMA (October 15, 2009 10:56 AM)  Physical Exam  General:  patient is stable today. Eyes:  no xanthelasma. Neck:  no jugular venous distention. Lungs:  lungs are clear respiratory effort is nonlabored. Heart:  cardiac exam reveals S1-S2.  No clicks or significant murmurs. Abdomen:  abdomen is soft. Extremities:  trace peripheral edema. Psych:  patient is oriented to person time and place.  Affect is normal.   Impression & Recommendations:  Problem # 1:  RENAL INSUFFICIENCY (ICD-588.9) This is being followed by Dr.Powell.   Problem # 2:  ATRIAL FIBRILLATION WITH RAPID VENTRICULAR RESPONSE (ICD-427.31)  Her updated medication list for this problem includes:    Metoprolol Succinate 25 Mg Xr24h-tab (Metoprolol succinate) .Marland Kitchen... Take one tablet by mouth daily    Digoxin 0.125 Mg Tabs (Digoxin) .Marland Kitchen... Take a 1/2  tablet by mouth daily 4 days a week We know that when she wore a Holter monitor she did not have marked tachycardia.  However, she is describing some symptoms of increased heart rate.  I will put her back on a very small dose of digoxin.  Problem # 3:  CONGESTIVE HEART FAILURE (ICD-428.0)  Her updated medication list for this problem includes:    Furosemide 40 Mg Tabs (Furosemide) .Marland Kitchen... Take 1 & 1/2 tab daily    Metoprolol Succinate 25 Mg Xr24h-tab (Metoprolol succinate) .Marland Kitchen... Take one tablet by mouth daily    Amlodipine Besylate 5 Mg Tabs (Amlodipine besylate) .Marland Kitchen... Take one tablet by mouth daily    Diltiazem Hcl Er Beads 240 Mg Xr24h-cap (Diltiazem hcl er beads) .Marland Kitchen... 1 by mouth daily    Digoxin 0.125 Mg Tabs (Digoxin) .Marland Kitchen... Take a 1/2  tablet by mouth daily 4 days a  week Patient has had some mild fluid overload.  To  smooth out her dosing, we will change her to 60 mg daily.  Patient Instructions: 1)  Change Furosemide to 60mg  daily (1 & 1/2 tabs daily) 2)  Restart Digoxin 0.125mg  1/2 tab daily 3)  Follow up in 3 months Prescriptions: DIGOXIN 0.125 MG TABS (DIGOXIN) Take a 1/2  tablet by mouth daily 4 days a week  #15 x 6   Entered by:   Meredith Staggers, RN   Authorized by:   Talitha Givens, MD, [facility]   Signed by:   Meredith Staggers, RN on 10/15/2009   Method used:   Electronically to        [id number]       [address]       [place], [place]  [address]       Ph: [phone] or [phone]       Fax: [phone]   RxID:   [id number] DIGOXIN 0.125 MG TABS (DIGOXIN) Take a 1/2  tablet by mouth daily  #15 x 6   Entered by:   Meredith Staggers, RN   Authorized by:   Talitha Givens, MD, [facility]   Signed  by:   Meredith Staggers, RN on 10/15/2009   Method used:   Electronically to        [facility] (retail[facility]       [address]       [place], [place]  [address]       Ph: [phone] or [phone]       Fax: [phone]   RxID:   [id number] FUROSEMIDE 40 MG  TABS (FUROSEMIDE) Take 1 & 1/2 tab daily  #45 x 6   Entered by:   Meredith Staggers, RN   Authorized by:   Talitha Givens, MD, [facility]   Signed by:   Meredith Staggers, RN on 10/15/2009   Method used:   Electronically to        [facility]       [address]       [place], [place]  [address]       Ph: [phone] or [phone]       Fax: [phone]   RxID:   [id number]

## 2010-03-13 NOTE — Miscellaneous (Signed)
Summary: BONE DENSITY  Clinical Lists Changes  Orders: Added new Test order of T-Bone Densitometry (77080) - Signed Added new Test order of T-Lumbar Vertebral Assessment (77082) - Signed 

## 2010-03-13 NOTE — Consult Note (Signed)
Summary: [facility]  [facility]   Imported By: Lanelle Bal 05/06/2009 10:54:13  _____________________________________________________________________  External Attachment:    Type:   Image     Comment:   External Document

## 2010-03-13 NOTE — Progress Notes (Signed)
Summary: fyi saw ORTHO today  Phone Note Call from Patient Call back at [facility] Phone [phone]   Caller: Patient Summary of Call: GOT XRAYS, THEN SAW DR Constance Goltz --ORTHOPEDIC SURG TODAY, HAS QUESTIONS TO ASK DR LOWNE  WHAT IS STATUS OF GENERIC NORVASC---[facility]  THEY GAVE HER THREE PILLS TO TIDE HER OVER--SHE DOESNT KNOW WHETHER TO START TODAY OR WAIT UNTIL TOMORROW Initial call taken by: Jerolyn Shin,  February 21, 2009 12:17 PM  Follow-up for Phone Call        pt wanted to let you know it was not her hip. pt states that the matter in between the last four vertebrae in her spine has disappeared. pt was given a injection, prednisone, muscle relaxer.  pt aware rx verbally called in to pharmac [facility]............Marland KitchenFelecia Deloach CMA  February 21, 2009 2:17 PM   Additional Follow-up for Phone Call Additional follow up Details #1::        I did not know anything about the generic not being available---  we may need to ask another pharmacy.    Additional Follow-up by: Loreen Freud DO,  February 21, 2009 2:23 PM    Additional Follow-up for Phone Call Additional follow up Details #2::    verbally informed dr Laury Axon rx was called in and it was just FYI about what ortho found, dr Laury Axon ok.............Marland KitchenFelecia Deloach CMA  February 21, 2009 3:31 PM

## 2010-03-13 NOTE — Letter (Signed)
Summary: Primary Care Consult Scheduled Letter  [place] [facility] [place]  [address] [place], [place] [address]   Phone: [phone]  Fax: [phone]      04/11/2009 MRN: [id number]  [name] [address] [name], [place]  [address]    Dear Ms. Kuiken,    We have scheduled an appointment for you.  At the recommendation of Dr. Loreen Freud, we have scheduled you a consult with Dr. Yancey Flemings of [facility] on 05-08-2009 at 2:45pm.  Their address is [address] floor, [place] [address]. The office phone number is [phone].  If this appointment day and time is not convenient for you, please feel free to call the office of the doctor you are being referred to at the number listed above and reschedule the appointment.    It is important for you to keep your scheduled appointments. We are here to make sure you are given good patient care.   Thank you,    Renee, Patient Care Coordinator [place] [facility]

## 2010-03-13 NOTE — Progress Notes (Signed)
Summary: refill  Phone Note Refill Request Message from:  F[facility] from [facility] fax [phone]  Refills Requested: Medication #1:  FLAGYL 500 MG TABS three times a day for 14 days. flagyl is on [facility] back order. I called around to other pharmacies and no one has this in stock.  Initial call taken by: Barb Merino,  March 19, 2009 4:10 PM  Follow-up for Phone Call        sent to [facility].Marland KitchenMarland KitchenBrett Canales [facility].Army Fossa CMA  March 19, 2009 4:17 PM

## 2010-03-13 NOTE — Assessment & Plan Note (Signed)
Summary: 3 wk f/u  Medications Added DIGOXIN 0.125 MG TABS (DIGOXIN) 1/2 by mouth daily DIGOXIN 0.125 MG TABS (DIGOXIN) 1/2 by mouth daily--ON HOLD MELATONIN 5 MG TABS (MELATONIN) 2-3 podialy ACIDOPHILUS PROBIOTIC BLEND  CAPS (PROBIOTIC PRODUCT) 1 by mouth daily      Allergies Added:   Visit Type:  Follow-up Primary Provider:  Loreen Freud DO  CC:  shortness of breath.  History of Present Illness: The patient is seen in followup shortness of breath and atrial fibrillation.  I saw her last Jun 13, 2009 at that time I thought that her edema was related mostly to venous insufficiency.  I chose not to adjust her diuretic at that time.  I arranged for a Holter monitor to see if she was having increased heart rates.  The monitor is available and I have reviewed all pages.  She does not have any tachycardia.  Her heart rate is controlled and at times she in fact has some mild bradycardia.  Digoxin level was checked and it was 0.8.  Today she returns and continues to have some exertional fatigue.  There is also some shortness of breath.  Current Medications (verified): 1)  Synthroid 88 Mcg Tabs (Levothyroxine Sodium) .... Once Daily 2)  Furosemide 40 Mg  Tabs (Furosemide) .... Take One and A Half Tablets Once Daily 3)  Nexium 40 Mg Cpdr (Esomeprazole Magnesium) .Marland Kitchen.. 1 By Mouth Two Times A Day 4)  Metoprolol Succinate 25 Mg Xr24h-Tab (Metoprolol Succinate) .... Take One Tablet By Mouth Daily 5)  Calcium Carbonate-Vitamin D 600-400 Mg-Unit  Tabs (Calcium Carbonate-Vitamin D) .... Once Daily 6)  Vitamin B Complex-C   Caps (B Complex-C) .... Once Daily 7)  Amlodipine Besylate 5 Mg Tabs (Amlodipine Besylate) .... Take One Tablet By Mouth Daily 8)  Digoxin 0.125 Mg Tabs (Digoxin) .... 1/2 By Mouth Daily 9)  Diltiazem Hcl Er Beads 240 Mg Xr24h-Cap (Diltiazem Hcl Er Beads) .Marland Kitchen.. 1 By Mouth Daily 10)  Osteo Bi-Flex Adv Triple St  Tabs (Misc Natural Products) 11)  Multivitamins   Tabs (Multiple Vitamin)  .... W/ Iron --- Once Daily 12)  Melatonin 5 Mg Tabs (Melatonin) .... 2-3 Podialy 13)  Acidophilus Probiotic Blend  Caps (Probiotic Product) .Marland Kitchen.. 1 By Mouth Daily  Allergies (verified): 1)  ! Cipro (Ciprofloxacin Hcl)  Past History:  Past Medical History:  Osteopenia  URI (ICD-465.9) OSTEOPENIA (ICD-733.90) CHEST XRAY, ABNORMAL (ICD-793.1) CERVICAL CANCER, HX OF (ICD-V10.41) B12 DEFICIENCY (ICD-266.2) BENIGN NEOPLASM OF ADRENAL GLAND (ICD-227.0) HYPOTHYROIDISM (ICD-244.9) URINARY INCONTINENCE (ICD-788.30) OSTEOARTHRITIS (ICD-715.90) GERD (ICD-530.81) COPD (ICD-496) EFFUSION, PLEURAL (ICD-511.9) CHEST PAIN (ICD-786.50)....negative Cardiolite in the past..... ANEMIA, B12 DEFICIENCY (ICD-281.1) FAMILY HISTORY DIABETES 1ST DEGREE RELATIVE (ICD-V18.0) FAMILY HISTORY OF CAD FEMALE 1ST DEGREE RELATIVE <60 (ICD-V16.49) FAMILY HISTORY BREAST CANCER 1ST DEGREE RELATIVE <50 (ICD-V16.3) FAMILY HISTORY OF ANEURYSM AORTIC (ICD-V17.4) BREAST CYST, LEFT (ICD-610.0) FOOT SURGERY, HX OF (ICD-V15.89) HEMORRHAGE, SUBARACHNOID (ICD-430)... therefore no Coumadin...or aspirin.. Dr. Dutch Quint neurosurgery .. aneurysm.. no treatment Ejection fraction 65%  / EF 60%... echo.... November, 2010  /   55-60%...echo..04/12/2009 Aortic insufficiency... mild..echo... 2007 /   mild...echo...04/12/2009 Mitral regurgitation... mild  /  mild... echo... November, 2010 /  mild...echo...04/12/2009 Aortic root dilatation... mild  /  4.5 cm... stable... echo... November, 2010 /  no mention....echo...04/12/2009 DIZZINESS (ICD-780.4) ANEMIA NOS (ICD-285.9) RENAL INSUFFICIENCY (ICD-588.9) PULMONARY EMBOLISM, HX OF (ICD-V12.51).Marland Kitchenand DVT.Marland Kitchen no Coumadin because of subarachnoid hemorrhage HYPERTENSION (ICD-401.9) HYPERLIPIDEMIA (ICD-272.4) DVT, HX OF (ICD-V12.51) CONGESTIVE HEART FAILURE (ICD-428.0) Atrial fibrillation...noted early 2011..can not take coumadin.. meds  adjusted for rate. / Holter monitor Jun 27, 2009.. no rapid heart  rate.... mild bradycardia at times....     Review of Systems       Patient denies fever, chills, headache, sweats, rash, change in vision, change in hearing, chest pain, cough, nausea or vomiting, urinary symptoms.  All of the systems are reviewed and are negative.  Vital Signs:  Patient profile:   77 year old female Height:      63 inches Weight:      155 pounds BMI:     27.56 Pulse rate:   60 / minute Resp:     16 per minute BP sitting:   135 / 76  (left arm)  Vitals Entered By: Marrion Coy, CNA (Jul 09, 2009 10:40 AM)  Physical Exam  General:  patient is stable today. Head:  head is atraumatic. Eyes:  no xanthelasma. Neck:  question of slight jugular venous distention. Chest Wall:  no chest wall tenderness. Lungs:  there are a few scattered rhonchi. Heart:  cardiac exam reveals S1 and S2.  There is a soft systolic murmur. Abdomen:  abdomen is soft. Msk:  no musculoskeletal deformities. Extremities:  1+ peripheral edema. Skin:  no skin rashes. Psych:  patient is oriented to person time and place.  Affect is normal.   Impression & Recommendations:  Problem # 1:  EDEMA (ICD-782.3) The patient has persistent edema.  It is possible that this does represent some volume overload.  I talked with her about her fluid intake and she is definitely drinking extra free water.  She does this because she is thirsty.  I explained more to her about the fact that her thirst does not represent lack of fluid.  She needs to try to ignore this and not drink as much extra fluids.  I did not adjust her diuretics.  Posterior back for followup.  Problem # 2:  ATRIAL FIBRILLATION WITH RAPID VENTRICULAR RESPONSE (ICD-427.31)  Her updated medication list for this problem includes:    Metoprolol Succinate 25 Mg Xr24h-tab (Metoprolol succinate) .Marland Kitchen... Take one tablet by mouth daily    Digoxin 0.125 Mg Tabs (Digoxin) .Marland Kitchen... 1/2 by mouth daily--on hold Monitor shows that her atrial fibrillation is not  elevated.  In fact there is relative bradycardia.  Her digoxin level was normal.  Digoxin will be put on hold at this point as she may need slight increase in her functional heart.  Problem # 3:  DYSPNEA/SHORTNESS OF BREATH (ICD-786.09)  The following medications were removed from the medication list:    Cardizem Cd 240 Mg Xr24h-cap (Diltiazem hcl coated beads) ..... One tab by mouth once daily Her updated medication list for this problem includes:    Furosemide 40 Mg Tabs (Furosemide) .Marland Kitchen... Take one and a half tablets once daily    Metoprolol Succinate 25 Mg Xr24h-tab (Metoprolol succinate) .Marland Kitchen... Take one tablet by mouth daily    Amlodipine Besylate 5 Mg Tabs (Amlodipine besylate) .Marland Kitchen... Take one tablet by mouth daily    Digoxin 0.125 Mg Tabs (Digoxin) .Marland Kitchen... 1/2 by mouth daily--on hold    Diltiazem Hcl Er Beads 240 Mg Xr24h-cap (Diltiazem hcl er beads) .Marland Kitchen... 1 by mouth daily I'm hopeful that with better control of her volume status and slight increase in her heart rate and she may feel better.  Problem # 4:  MITRAL REGURGITATION (ICD-396.3) Mitral regurgitation is mild.  No further workup.  Patient Instructions: 1)  Hold Digoxin for now 2)  Decrease your fluid intake  3)  Follow up in 1 month Prescriptions: AMLODIPINE BESYLATE 5 MG TABS (AMLODIPINE BESYLATE) Take one tablet by mouth daily  #30 x 6   Entered by:   Meredith Staggers, RN   Authorized by:   Talitha Givens, MD, [facility]   Signed by:   Meredith Staggers, RN on 07/09/2009   Method used:   Electronically to        [facility]       [address]       [place], [place]  [address]       Ph: [phone] or [phone]       Fax: [phone]   RxID:   [id number]

## 2010-03-13 NOTE — Assessment & Plan Note (Signed)
Summary: rto /cbs   Vital Signs:  Patient profile:   77 year old female Height:      63 inches Weight:      158.13 pounds BMI:     28.11 Pulse rate:   90 / minute Pulse rhythm:   regular BP sitting:   124 / 74  (left arm) Cuff size:   regular  Vitals Entered By: Army Fossa CMA (June 06, 2009 1:17 PM) CC: Pt here to discuss her being tired all the time.    History of Present Illness: Pt here to discuss being tired all the time.  Cardio was working on meds thinking that may be causing it.  Pt is getting b12 shots. She stopped taking her multi vitamin.  Pt was slightly anemic on labs done in March.   Pt also c/o con't burping even with nexium daily.  Pt is very frustrated with the fatigue.    Current Medications (verified): 1)  Synthroid 88 Mcg Tabs (Levothyroxine Sodium) .... Once Daily 2)  Furosemide 40 Mg  Tabs (Furosemide) .... Take One and A Half Tablets Once Daily 3)  Align  Caps (Probiotic Product) 4)  Nexium 40 Mg Cpdr (Esomeprazole Magnesium) .Marland Kitchen.. 1 By Mouth Two Times A Day 5)  Cardizem Cd 240 Mg Xr24h-Cap (Diltiazem Hcl Coated Beads) .... One Tab By Mouth Once Daily 6)  Metoprolol Succinate 25 Mg Xr24h-Tab (Metoprolol Succinate) .... Take One Tablet By Mouth Daily 7)  Calcium Carbonate-Vitamin D 600-400 Mg-Unit  Tabs (Calcium Carbonate-Vitamin D) .... Once Daily 8)  Vitamin B Complex-C   Caps (B Complex-C) .... Once Daily 9)  Amlodipine Besylate 5 Mg Tabs (Amlodipine Besylate) .... Take One Tablet By Mouth Daily 10)  Digoxin 0.125 Mg Tabs (Digoxin) .... Take 1/2 Tab 4 Times A Day Times 3 Days :then 1/2 Tab Everyday 11)  Detrol La 4 Mg Xr24h-Cap (Tolterodine Tartrate) .Marland Kitchen.. 1 By Mouth Daily. 12)  Diltiazem Hcl Er Beads 240 Mg Xr24h-Cap (Diltiazem Hcl Er Beads) .Marland Kitchen.. 1 By Mouth Daily 13)  Osteo Bi-Flex Adv Triple St  Tabs (Misc Natural Products)  Allergies: 1)  ! Cipro (Ciprofloxacin Hcl)  Past History:  Past medical, surgical, family and social histories (including  risk factors) reviewed for relevance to current acute and chronic problems.  Past Medical History: Reviewed history from 04/15/2009 and no changes required.  Osteopenia  URI (ICD-465.9) OSTEOPENIA (ICD-733.90) CHEST XRAY, ABNORMAL (ICD-793.1) CERVICAL CANCER, HX OF (ICD-V10.41) B12 DEFICIENCY (ICD-266.2) BENIGN NEOPLASM OF ADRENAL GLAND (ICD-227.0) HYPOTHYROIDISM (ICD-244.9) URINARY INCONTINENCE (ICD-788.30) OSTEOARTHRITIS (ICD-715.90) GERD (ICD-530.81) COPD (ICD-496) EFFUSION, PLEURAL (ICD-511.9) CHEST PAIN (ICD-786.50)....negative Cardiolite in the past..... ANEMIA, B12 DEFICIENCY (ICD-281.1) FAMILY HISTORY DIABETES 1ST DEGREE RELATIVE (ICD-V18.0) FAMILY HISTORY OF CAD FEMALE 1ST DEGREE RELATIVE <60 (ICD-V16.49) FAMILY HISTORY BREAST CANCER 1ST DEGREE RELATIVE <50 (ICD-V16.3) FAMILY HISTORY OF ANEURYSM AORTIC (ICD-V17.4) BREAST CYST, LEFT (ICD-610.0) FOOT SURGERY, HX OF (ICD-V15.89) HEMORRHAGE, SUBARACHNOID (ICD-430)... therefore no Coumadin...or aspirin.. Dr. Dutch Quint neurosurgery .. aneurysm.. no treatment Ejection fraction 65%  / EF 60%... echo.... November, 2010  /   55-60%...echo..04/12/2009 Aortic insufficiency... mild..echo... 2007 /   mild...echo...04/12/2009 Mitral regurgitation... mild  /  mild... echo... November, 2010 /  mild...echo...04/12/2009 Aortic root dilatation... mild  /  4.5 cm... stable... echo... November, 2010 /  no mention....echo...04/12/2009 DIZZINESS (ICD-780.4) ANEMIA NOS (ICD-285.9) RENAL INSUFFICIENCY (ICD-588.9) PULMONARY EMBOLISM, HX OF (ICD-V12.51).Marland Kitchenand DVT.Marland Kitchen no Coumadin because of subarachnoid hemorrhage HYPERTENSION (ICD-401.9) HYPERLIPIDEMIA (ICD-272.4) DVT, HX OF (ICD-V12.51) CONGESTIVE HEART FAILURE (ICD-428.0) Atrial fibrillation...noted early 2011..can not take coumadin.. meds  adjusted for rate.    Past Surgical History: Reviewed history from 09/05/2007 and no changes required. Cataract extraction Cholecystectomy PE--- vena cava  filter Hip fracture ORIF (07/2007)  Family History: Reviewed history from 08/11/2006 and no changes required. Family History of Aneurysm Aortic Family History Breast cancer 1st degree relative <50 Family History of CAD Female 1st degree relative <60 Family History of Prostate CA 1st degree relative <50 Family History of Stroke M 1st degree relative <50 Family History Diabetes 1st degree relative  Social History: Reviewed history from 08/11/2006 and no changes required. 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 Lungs are clear to auscultation, no crackles or wheezes. Heart:  irreg , irreg Abdomen:  + mid epigastric tenderness  soft, no distention, and no masses.   Psych:  Oriented X3 and normally interactive.     Impression & Recommendations:  Problem # 1:  GERD (ICD-530.81)  Her updated medication list for this problem includes:    Nexium 40 Mg Cpdr (Esomeprazole magnesium) .Marland Kitchen... 1 by mouth two times a day  Orders: Gastroenterology Referral (GI)  Diagnostics Reviewed:  Discussed lifestyle modifications, diet, antacids/medications, and preventive measures. Handout provided.   Problem # 2:  UNSPECIFIED ANEMIA (ICD-285.9) MVI with iron recheck labs today check ifob Orders: Venipuncture ([address]) TLB-IBC Pnl (Iron/FE;Transferrin) (83550-IBC) TLB-CBC Platelet - w/Differential (85025-CBCD) TLB-Ferritin (82728-FER) TLB-B12 + Folate Pnl (60454_09811-B14/NWG)  Hgb: 11.5 (04/10/2009)   Hct: 35.8 (04/10/2009)   Platelets: 147.0 (04/10/2009) RBC: 3.59 (04/10/2009)   RDW: 15.4 (04/10/2009)   WBC: 5.6 (04/10/2009) MCV: 99.8 (04/10/2009)   MCHC: 32.2 (04/10/2009) Iron: 52 (03/15/2009)   TIBC: 318  (03/15/2009)   % Sat: 16 (03/15/2009) B12: >2000 pg/mL (03/15/2009)   Folate: >20.0 ng/mL (03/15/2009)   TSH: 1.16 (04/10/2009)  Problem # 3:  FATIGUE (ICD-780.79) recheck cbc today pt has f/u with cardio next week refer to GI for GERD  Complete Medication List: 1)  Synthroid 88 Mcg Tabs (Levothyroxine sodium) .... Once daily 2)  Furosemide 40 Mg Tabs (Furosemide) .... Take one and a half tablets once daily 3)  Align Caps (Probiotic product) 4)  Nexium 40 Mg Cpdr (Esomeprazole magnesium) .Marland Kitchen.. 1 by mouth two times a day 5)  Cardizem Cd 240 Mg Xr24h-cap (Diltiazem hcl coated beads) .... One tab by mouth once daily 6)  Metoprolol Succinate 25 Mg Xr24h-tab (Metoprolol succinate) .... Take one tablet by mouth daily 7)  Calcium Carbonate-vitamin D 600-400 Mg-unit Tabs (Calcium carbonate-vitamin d) .... Once daily 8)  Vitamin B Complex-c Caps (B complex-c) .... Once daily 9)  Amlodipine Besylate 5 Mg Tabs (Amlodipine besylate) .... Take one tablet by mouth daily 10)  Digoxin 0.125 Mg Tabs (Digoxin) .... Take 1/2 tab 4 times a day times 3 days :then 1/2 tab everyday 11)  Detrol La 4 Mg Xr24h-cap (Tolterodine tartrate) .Marland Kitchen.. 1 by mouth daily. 12)  Diltiazem Hcl Er Beads 240 Mg Xr24h-cap (Diltiazem hcl er beads) .Marland Kitchen.. 1 by mouth daily 13)  Osteo Bi-flex Adv Triple St Tabs (Misc natural products)

## 2010-03-13 NOTE — Assessment & Plan Note (Signed)
Summary: 1wk f/u sl  Medications Added METOPROLOL SUCCINATE 25 MG XR24H-TAB (METOPROLOL SUCCINATE) Take one tablet by mouth daily      Allergies Added:   Visit Type:  Follow-up Primary Provider:  Loreen Freud DO  CC:  atrial fibrillation.  History of Present Illness: The patient is seen for followup of atrial fibrillation.  She was seen by Dr.Stuckey and and then later by Dr. Juanda Chance.  The diagnosis of atrial fibrillation is new in the past several months.  She is not a Coumadin candidate because of a prior bleed.  She had congestive heart failure with a ritual fib.  She was started on medications for rate control and diuresis.  She is feeling much better.  Her labs checked along the way have shown that her thyroid function is normal.  BUN was 1.5 when checked on March 13, 2009.  She still describes rapid heart rate with activity.  She's not having any chest pain.  She's not had any syncope or presyncope.  Current Medications (verified): 1)  Synthroid 100 Mcg Tabs (Levothyroxine Sodium) .... Take One Tablet By Mouth Once Daily. 2)  Zocor 80 Mg  Tabs (Simvastatin) .... On Hold 3)  Fosamax 70 Mg Tabs (Alendronate Sodium) .Marland Kitchen.. 1 By Mouth Weekly -- Hold 4)  Furosemide 40 Mg  Tabs (Furosemide) .... Take One and A Half Tablets Once Daily 5)  Antivert 25 Mg Tabs (Meclizine Hcl) .Marland Kitchen.. 1 By Mouth Three Times A Day As Needed 6)  Amitriptyline Hcl 10 Mg Tabs (Amitriptyline Hcl) .Marland Kitchen.. 1 By Mouth At Bedtime As Needed 7)  Vitamin D3 1000 Unit Tabs (Cholecalciferol) .... Take 1 Tab Once Daily---Hold 8)  Ultram 50 Mg Tabs (Tramadol Hcl) .Marland Kitchen.. 1-2 By Mouth Every 6 Hours As Needed 9)  Detrol La 4 Mg Xr24h-Cap (Tolterodine Tartrate) .Marland Kitchen.. 1 By Mouth Once Daily 10)  Align  Caps (Probiotic Product) 11)  Nexium 40 Mg Cpdr (Esomeprazole Magnesium) .Marland Kitchen.. 1 By Mouth Once Daily 12)  Cardizem Cd 240 Mg Xr24h-Cap (Diltiazem Hcl Coated Beads) .... One Tab By Mouth Once Daily  Allergies (verified): 1)  ! Cipro  (Ciprofloxacin Hcl)  Past History:  Past Medical History: Last updated: 04/15/2009  Osteopenia  URI (ICD-465.9) OSTEOPENIA (ICD-733.90) CHEST XRAY, ABNORMAL (ICD-793.1) CERVICAL CANCER, HX OF (ICD-V10.41) B12 DEFICIENCY (ICD-266.2) BENIGN NEOPLASM OF ADRENAL GLAND (ICD-227.0) HYPOTHYROIDISM (ICD-244.9) URINARY INCONTINENCE (ICD-788.30) OSTEOARTHRITIS (ICD-715.90) GERD (ICD-530.81) COPD (ICD-496) EFFUSION, PLEURAL (ICD-511.9) CHEST PAIN (ICD-786.50)....negative Cardiolite in the past..... ANEMIA, B12 DEFICIENCY (ICD-281.1) FAMILY HISTORY DIABETES 1ST DEGREE RELATIVE (ICD-V18.0) FAMILY HISTORY OF CAD FEMALE 1ST DEGREE RELATIVE <60 (ICD-V16.49) FAMILY HISTORY BREAST CANCER 1ST DEGREE RELATIVE <50 (ICD-V16.3) FAMILY HISTORY OF ANEURYSM AORTIC (ICD-V17.4) BREAST CYST, LEFT (ICD-610.0) FOOT SURGERY, HX OF (ICD-V15.89) HEMORRHAGE, SUBARACHNOID (ICD-430)... therefore no Coumadin...or aspirin.. Dr. Dutch Quint neurosurgery .. aneurysm.. no treatment Ejection fraction 65%  / EF 60%... echo.... November, 2010  /   55-60%...echo..04/12/2009 Aortic insufficiency... mild..echo... 2007 /   mild...echo...04/12/2009 Mitral regurgitation... mild  /  mild... echo... November, 2010 /  mild...echo...04/12/2009 Aortic root dilatation... mild  /  4.5 cm... stable... echo... November, 2010 /  no mention....echo...04/12/2009 DIZZINESS (ICD-780.4) ANEMIA NOS (ICD-285.9) RENAL INSUFFICIENCY (ICD-588.9) PULMONARY EMBOLISM, HX OF (ICD-V12.51).Marland Kitchenand DVT.Marland Kitchen no Coumadin because of subarachnoid hemorrhage HYPERTENSION (ICD-401.9) HYPERLIPIDEMIA (ICD-272.4) DVT, HX OF (ICD-V12.51) CONGESTIVE HEART FAILURE (ICD-428.0) Atrial fibrillation...noted early 2011..can not take coumadin.. meds adjusted for rate.    Review of Systems       Patient denies fever, chills, headache, sweats, rash, change in vision, change in  hearing, nausea vomiting, urinary symptoms.  All other systems are reviewed and are negative other than the  history of present illness.  Vital Signs:  Patient profile:   77 year old female Height:      63 inches Weight:      160 pounds Pulse rate:   69 / minute BP sitting:   140 / 88  (left arm) Cuff size:   regular  Vitals Entered By: Burnett Kanaris, CNA (April 16, 2009 2:32 PM)  Physical Exam  General:  patient is more stable today. Head:  head is atraumatic. Eyes:  no xanthelasma. Neck:  no jugular venous distention. Chest Wall:  no chest wall tenderness. Lungs:  lungs reveal a few scattered rhonchi and some decreased breath sounds at the left base. Heart:  cardiac exam reveals S1-S2.  The rhythm is irregularly irregular.  The rate is increased. Abdomen:  abdomen is soft. Msk:  no musculoskeletal deformities. Extremities:  trace peripheral edema today. Skin:  no skin rashes. Psych:  patient is oriented to person time and place.  Affect is normal.   Impression & Recommendations:  Problem # 1:  ATRIAL FIBRILLATION WITH RAPID VENTRICULAR RESPONSE (ICD-427.31)  The atrial fibrillation rate is still increased.  I will not increase the Cardizem any further.  I considered either beta blocker or digoxin.  Because she has symptoms with exercise I will have beta blocker.  I also need to know more about her renal function before we try digoxin.  She is not a Coumadin candidate.  Her updated medication list for this problem includes:    Metoprolol Succinate 25 Mg Xr24h-tab (Metoprolol succinate) .Marland Kitchen... Take one tablet by mouth daily  Orders: TLB-BMP (Basic Metabolic Panel-BMET) (80048-METABOL)  Problem # 2:  RENAL CYST (ICD-593.2) Is my understanding that at some point she need urology followup.  Problem # 3:  CONGESTIVE HEART FAILURE (ICD-428.0)  Her updated medication list for this problem includes:    Furosemide 40 Mg Tabs (Furosemide) .Marland Kitchen... Take one and a half tablets once daily    Cardizem Cd 240 Mg Xr24h-cap (Diltiazem hcl coated beads) ..... One tab by mouth once daily     Metoprolol Succinate 25 Mg Xr24h-tab (Metoprolol succinate) .Marland Kitchen... Take one tablet by mouth daily The patient's congestive heart failure is better controlled.  This appears to be diastolic with good systolic function by echo.  I have reviewed extensively the notes by the prior physicians that have seen her over the last weeks.  I discussed situation with her.  Have also encouraged her to restart her over-the-counter meds only on limited basis.  This will be outlined in the note.  Chemistry will be checked again today.  Lopressor 25 mg daily which was started.  I'll see her for early followup.  Patient Instructions: 1)  Start Metoprolol Succinate 25mg  daily 2)  Labs today 3)  Give me a call next week with an update on how you are [phone] Ethel Rana. 4)  Follow up in 2 weeks Prescriptions: METOPROLOL SUCCINATE 25 MG XR24H-TAB (METOPROLOL SUCCINATE) Take one tablet by mouth daily  #30 x 3   Entered by:   Meredith Staggers, RN   Authorized by:   Talitha Givens, MD, [facility]   Signed by:   Meredith Staggers, RN on 04/16/2009   Method used:   Electronically to        [id number]       [address]       [place],  [place]  [address]       Ph: [phone] or [phone]       Fax: [phone]   RxID:   [id number]

## 2010-03-13 NOTE — Letter (Signed)
Summary: New Patient letter  [facility]  [address] [place], [place] [address]   Phone: [phone]  Fax: [phone]       04/11/2009 MRN: [id number]  [name] [address] [name], [place]  [address]  Dear [name],  Welcome to the Gastroenterology Division at [facility].    You are scheduled to see Dr.  Yancey Flemings on May 08, 2009 at 2:45 on the 3rd floor at [facility], [address]  We ask that you try to arrive at our office 15 minutes prior to your appointment time to allow for check-in.  We would like you to complete the enclosed self-administered evaluation form prior to your visit and bring it with you on the day of your appointment.  We will review it with you.  Also, please bring a complete list of all your medications or, if you prefer, bring the medication bottles and we will list them.  Please bring your insurance card so that we may make a copy of it.  If your insurance requires a referral to see a specialist, please bring your referral form from your primary care physician.  Co-payments are due at the time of your visit and may be paid by cash, check or credit card.     Your office visit will consist of a consult with your physician (includes a physical exam), any laboratory testing he/she may order, scheduling of any necessary diagnostic testing (e.g. x-ray, ultrasound, CT-scan), and scheduling of a procedure (e.g. Endoscopy, Colonoscopy) if required.  Please allow enough time on your schedule to allow for any/all of these possibilities.    If you cannot keep your appointment, please call [phone] to cancel or reschedule prior to your appointment date.  This allows [place] the opportunity to schedule an appointment for another patient in need of care.  If you do not cancel or reschedule by 5 p.m. the business day prior to your appointment date, you will be charged a $50.00 late cancellation/no-show fee.    Thank  you for choosing [facility] for your medical needs.  We appreciate the opportunity to care for you.  Please visit [place] at our website  to learn more about our practice.                     Sincerely,                                                             The Gastroenterology Division

## 2010-03-13 NOTE — Consult Note (Signed)
Summary: [facility]  [facility]   Imported By: Lanelle Bal 08/20/2009 10:17:25  _____________________________________________________________________  External Attachment:    Type:   Image     Comment:   External Document

## 2010-03-13 NOTE — Progress Notes (Signed)
Summary: Questions about pt restrictions for therapy   Phone Note Other Incoming Call back at [phone]   Caller: [facility] Dwaine Gale Summary of Call: Rehab calling to check to see if there is any restrictions for the pt to do therapy Initial call taken by: Judie Grieve,  July 31, 2009 4:23 PM  Follow-up for Phone Call        spoke w/Marti pt was referred to PT for balance and leg weakness, w/pts cardiac history she just wanted to know if Dr Myrtis Ser had any restricitons for HR or anything, will check w/Dr Myrtis Ser and call her back Meredith Staggers, RN  July 31, 2009 4:40 PM   Additional Follow-up for Phone Call Additional follow up Details #1::        Keep heart rate no higher than 120.  Talitha Givens, MD, [facility]  August 05, 2009 3:36 PM  spoke w/April she is aware and will make note in pts chart Meredith Staggers, RN  August 06, 2009 3:46 PM

## 2010-03-13 NOTE — Progress Notes (Signed)
Summary: DISCUSS CIPRO & FLAGYL  Phone Note Outgoing Call Call back at Home Phone [phone]   Call placed by: Magdalen Spatz [facility],  March 21, 2009 10:53 AM Call placed to: Patient Summary of Call: I CALLED TO INFORM PT OF HER APPT W/[facility] FOR THE RENAL CYST, AND PATIENT STATES SHE WAS JUST ABOUT TO CALL us ANYWAY.  PATIENT STATES LAST NIGHT SHE BECAME SO WEAK, FELT LIKE SHE WAS GOING TO DIE, ALMOST PRESSED HER PANIC BUTTON.  PATIENT BELIEVES THIS MAY BE THE CIPRO & FLAGYL COMBINED THAT IS CAUSING HER TO FEEL THIS WAY.  PATIENT BELIEVES SHE IS TAKING TOO MUCH MEDICATION.  PER DR. Laury Axon, PATIENT NOT TO TAKE ANYMORE OF THE ABOVE MEDS, UNTIL SHE RECIEVES A CALL FROM OUR OFFICE.  PATIENT UNDERSTANDS.  PLEASE CALL ASAP. Initial call taken by: Magdalen Spatz [facility] [place],  March 21, 2009 10:53 AM  Follow-up for Phone Call        pt c/o nausea,diarrhea, night sweat,abdominal pain,weakness, difficulty walking. pt states that she has been in bed for the past 3 days. pt denies any dizziness, fever, or vomiting or urinary issue. pt is taking med with food but has d/c med for now..pls advise.................Marland KitchenFelecia Deloach CMA  March 21, 2009 12:05 PM    Additional Follow-up for Phone Call Additional follow up Details #1::        If pt still having abd pain and now with diarrhea---she should go to ER --- Pt has diverticulitis and it is usually treated with both abx---if she is unable to tolerate she may need it IV. Additional Follow-up by: Loreen Freud DO,  March 21, 2009 1:16 PM    Additional Follow-up for Phone Call Additional follow up Details #2::    pt informed of dr Laury Axon suggestion however pt states that she is no longer having the diarrhea since she took Imodium. pt does still complain of abdominal pain. pt advise that she may need med in IV and to go to ED. Pt decline stating that she would like to wait to see how she feels tomorrow before go to ED ....................Marland KitchenFelecia  Deloach CMA  March 21, 2009 2:16 PM   Additional Follow-up for Phone Call Additional follow up Details #3:: Details for Additional Follow-up Action Taken: please call pt tomorrow to see how she is  tried to call pt line busy will try again later....................Marland KitchenFelecia Deloach CMA  March 22, 2009 9:12 AM  line still busy will try again later..................Marland KitchenFelecia Deloach CMA  March 22, 2009 10:04 AM   pt states that she is doing much better today.......................Marland KitchenFelecia Deloach CMA  March 22, 2009 10:42 AM  Additional Follow-up by: Loreen Freud DO,  March 21, 2009 3:24 PM

## 2010-03-13 NOTE — Miscellaneous (Signed)
Summary: Updated problem list  Clinical Lists Changes  Problems: Removed problem of NONSPECIFIC ABNORM RESULTS KIDNEY FUNCTION STUDY (ICD-794.4) Removed problem of INGUINAL PAIN, BILATERAL (ICD-789.09) Removed problem of UNSPECIFIED ANEMIA (ICD-285.9) Removed problem of THRUSH (ICD-112.0) Removed problem of LOOSE STOOLS (ICD-787.91) Removed problem of ABDOMINAL PAIN OTHER SPECIFIED SITE (ICD-789.09) Removed problem of HIP PAIN, RIGHT (ICD-719.45) Removed problem of EYE FLOATERS, RIGHT (ICD-379.24) Removed problem of FATIGUE (ICD-780.79) Removed problem of CALF PAIN, LEFT (ICD-729.5) Removed problem of INFLUENZA, WITH RESPIRATORY SYMPTOMS (ICD-487.1) Removed problem of PE (ICD-415.19) - vena cava  filter  02/15/07-----Hoss Removed problem of CHEST XRAY, ABNORMAL (ICD-793.1) Removed problem of GERD (ICD-530.81) Removed problem of EFFUSION, PLEURAL (ICD-511.9) Removed problem of NONSPECIFIC ABNORM RESULTS KIDNEY FUNCTION STUDY (ICD-794.4) Removed problem of ANEMIA NOS (ICD-285.9) Removed problem of RENAL INSUFFICIENCY (ICD-588.9)

## 2010-03-13 NOTE — Progress Notes (Signed)
Summary: SHINGLES  Phone Note Call from Patient   Caller: Patient Summary of Call: PT WAS INFORMED BY ORTHOPEDIST TODAY THAT SHE HAS SHINGLES. PT IS UNABLE TO DRIVE AND WANTS TO KNOW IF SOMETHING CAN BE CALLED INTO [facility] FOR [facility]. HER PHONE NUMBER IS [phone]. Initial call taken by: Lavell Islam,  December 19, 2009 3:09 PM  Follow-up for Phone Call        Pt states she was having a pain in her (R) shoulder and under (R) breast. Dr.Olan told her that she has shingles- they saw it and told her that they were unable to call anything in for her but she was to call her PCP and tell them that Dr.Olan said they needed to call something in for her and she has shingles. Please advise. Army Fossa CMA  December 19, 2009 3:51 PM   Additional Follow-up for Phone Call Additional follow up Details #1::        valtrex 1 g 1 by mouth three times a day for 10 days  Additional Follow-up by: Loreen Freud DO,  December 19, 2009 4:59 PM    Additional Follow-up for Phone Call Additional follow up Details #2::    Pt is aware. Army Fossa CMA  December 19, 2009 5:02 PM   New/Updated Medications: VALTREX 1 GM TABS (VALACYCLOVIR HCL) 1 by mouth three times a day for 10 days Prescriptions: VALTREX 1 GM TABS (VALACYCLOVIR HCL) 1 by mouth three times a day for 10 days  #30 x 0   Entered by:   Army Fossa CMA   Authorized by:   Loreen Freud DO   Signed by:   Army Fossa CMA on 12/19/2009   Method used:   Electronically to        [facility]       [address]       [place], [place]  [address]       Ph: [phone] or [phone]       Fax: [phone]   RxID:   [id number]

## 2010-03-13 NOTE — Progress Notes (Signed)
Summary: reastion to med, pain still no better  Phone Note Call from Patient Call back at [facility] Phone [phone]   Caller: Patient Details for Reason: Office Message from Date: Time of Call: 8:04:11 AM Faxed To: [facility] Caller: Zohal Fax Number: [phone] Facility: home Patient: [name], [name] DOB: 13-Aug-1918 Phone: [phone] Provider: Lelon Perla Message: patient states the medication they had her on was making her hallucinate and she stopped taking it and needs something for shingles. Regarding Appointment: Appt Date: Appt Time: Unknown Provider: Reason: Details: Outcome: Summary of Call: Pt left VM that she ran out of med and is in extreme pain.  Pt would like to know what she can do since the VICODIN ES 7.5-750 MG caused her to have hallucination. Pt uses [facility]  [address]... pls advise...............Marland KitchenFelecia Deloach CMA  December 31, 2009 10:09 AM   Follow-up for Phone Call        neurontin 300mg  ---#30  1 by mouth at bedtime for 2-3 days then can increase to two times a day for 2-3 days then can take three times a day if she needs to ---  #90   ultram 50 mg 1 by mouth every 6 hours as needed for pain  #30   Follow-up by: Loreen Freud DO,  December 31, 2009 11:52 AM  Additional Follow-up for Phone Call Additional follow up Details #1::        Pt aware rx sent to pharmacy...........Marland KitchenFelecia Deloach CMA  December 31, 2009 12:55 PM    New Allergies: ! VICODIN New/Updated Medications: ULTRAM 50 MG TABS (TRAMADOL HCL) Take 1 by mouth every 6 hours as needed for pain NEURONTIN 300 MG CAPS (GABAPENTIN) Take 1 by mouth at bedtime for 2-3 days then can increase to two times a day for 2-3 days NEURONTIN 300 MG CAPS (GABAPENTIN) Then take 1 take three times a day as needed New Allergies: ! VICODINPrescriptions: NEURONTIN 300 MG CAPS (GABAPENTIN) Then take 1 take three times a day as needed  #90 x 0   Entered by:   Jeremy Johann CMA   Authorized by:   Loreen Freud DO   Signed by:   Jeremy Johann CMA on 12/31/2009   Method used:   Faxed to ...       [id number] (retail)       [address]       [place], [place]  [address]       Ph: [phone] or [phone]       Fax: [phone]   RxID:   [id number] NEURONTIN 300 MG CAPS (GABAPENTIN) Take 1 by mouth at bedtime for 2-3 days then can increase to two times a day for 2-3 days  #30 x 0   Entered by:   Jeremy Johann CMA   Authorized by:   Loreen Freud DO   Signed by:   Jeremy Johann CMA on 12/31/2009   Method used:   Faxed to ...       [id number] (retail)       [address]       [place], [place]  [address]       Ph: [phone] or [phone]       Fax: [phone]   RxID:   [id number] ULTRAM 50 MG TABS (TRAMADOL HCL) Take 1 by mouth every 6 hours as needed for pain  #30 x 0   Entered by:   Jeremy Johann CMA   Authorized by:  Loreen Freud DO   Signed by:   Jeremy Johann CMA on 12/31/2009   Method used:   Faxed to ...       [id number] (retail)       [address]       [place], [place]  [address]       Ph: [phone] or [phone]       Fax: [phone]   RxID:   [id number]

## 2010-03-13 NOTE — Assessment & Plan Note (Signed)
Summary: cpx /b12/cbs   Vital Signs:  Patient profile:   77 year old female Height:      63 inches Weight:      154.8 pounds Temp:     98.2 degrees F oral Pulse rate:   56 / minute Pulse rhythm:   irregular BP sitting:   138 / 82  (left arm) Cuff size:   regular  Vitals Entered By: Almeta Monas CMA Duncan Dull) (December 06, 2009 2:18 PM)  CC: CPX//has EKG'S done with Dr.Katz. Last done 07/18/09   History of Present Illness: Pt here for cpe.  Dr Myrtis Ser ( cardio) does her EKGs.  Dr Ethelene Hal and Charlann Boxer ( ortho) see her for her back.  Pt had epidural Tuesday and she feels like she is moving better today.   optho--Dr McFarlin -q1y. Cataracts done by Dr epps.  Dentist Dr Walker--q61m.  Urologist-- Dr Aldean Ast.   Pulm--Dr Clance.     Preventive Screening-Counseling & Management  Alcohol-Tobacco     Alcohol drinks/day: <1     Alcohol type: wine,  scotch, margarita     Smoking Status: never  Caffeine-Diet-Exercise     Caffeine use/day: 2     Does Patient Exercise: yes     Type of exercise: walk     Times/week: 5  Current Medications (verified): 1)  Synthroid 88 Mcg Tabs (Levothyroxine Sodium) .... Once Daily 2)  Furosemide 40 Mg  Tabs (Furosemide) .... Take 1 & 1/2 Tab Daily 3)  Nexium 40 Mg Cpdr (Esomeprazole Magnesium) .Marland Kitchen.. 1 By Mouth Two Times A Day 4)  Metoprolol Succinate 25 Mg Xr24h-Tab (Metoprolol Succinate) .... Take One Tablet By Mouth Daily 5)  Calcium Carbonate-Vitamin D 600-400 Mg-Unit  Tabs (Calcium Carbonate-Vitamin D) .... Once Daily 6)  Vitamin B Complex-C   Caps (B Complex-C) .... Once Daily 7)  Amlodipine Besylate 5 Mg Tabs (Amlodipine Besylate) .... Take One Tablet By Mouth Daily 8)  Diltiazem Hcl Er Beads 240 Mg Xr24h-Cap (Diltiazem Hcl Er Beads) .Marland Kitchen.. 1 By Mouth Daily 9)  Osteo Bi-Flex Adv Triple St  Tabs (Misc Natural Products) .... Take 1 Tablet By Mouth Once A Day 10)  Multivitamins   Tabs (Multiple Vitamin) .... W/ Iron --- Once Daily 11)  Melatonin 5 Mg Tabs  (Melatonin) .... Take 1 Tablet By Mouth Once A Day 12)  Acidophilus Probiotic Blend  Caps (Probiotic Product) .Marland Kitchen.. 1 By Mouth Daily 13)  Acetaminophen 325 Mg  Tabs (Acetaminophen) .... As Needed 14)  Digoxin 0.125 Mg Tabs (Digoxin) .... Take A 1/2  Tablet By Mouth Daily 4 Days A Week  Allergies (verified): 1)  ! Cipro (Ciprofloxacin Hcl)  Past History:  Past Medical History: Last updated: 10/15/2009  Osteopenia  URI (ICD-465.9) OSTEOPENIA (ICD-733.90) CHEST XRAY, ABNORMAL (ICD-793.1) CERVICAL CANCER, HX OF (ICD-V10.41) B12 DEFICIENCY (ICD-266.2) BENIGN NEOPLASM OF ADRENAL GLAND (ICD-227.0) HYPOTHYROIDISM (ICD-244.9) URINARY INCONTINENCE (ICD-788.30) OSTEOARTHRITIS (ICD-715.90) GERD (ICD-530.81) COPD (ICD-496) EFFUSION, PLEURAL (ICD-511.9) CHEST PAIN (ICD-786.50)....negative Cardiolite in the past..... ANEMIA, B12 DEFICIENCY (ICD-281.1) FAMILY HISTORY DIABETES 1ST DEGREE RELATIVE (ICD-V18.0) FAMILY HISTORY OF CAD FEMALE 1ST DEGREE RELATIVE <60 (ICD-V16.49) FAMILY HISTORY BREAST CANCER 1ST DEGREE RELATIVE <50 (ICD-V16.3) FAMILY HISTORY OF ANEURYSM AORTIC (ICD-V17.4) BREAST CYST, LEFT (ICD-610.0) FOOT SURGERY, HX OF (ICD-V15.89) HEMORRHAGE, SUBARACHNOID (ICD-430)... therefore no Coumadin...or aspirin.. Dr. Dutch Quint neurosurgery .. aneurysm.. no treatment EF        65%  / EF 60%... echo.... November, 2010  /   55-60%...echo..04/12/2009 Aortic insufficiency... mild..echo... 2007 /   mild...echo...04/12/2009 Mitral regurgitation... mild  /  mild... echo... November, 2010 /  mild...echo...04/12/2009 Aortic root dilatation... mild  /  4.5 cm... stable... echo... November, 2010 /  no mention....echo...04/12/2009 DIZZINESS (ICD-780.4) ANEMIA NOS (ICD-285.9) RENAL INSUFFICIENCY (ICD-588.9) PULMONARY EMBOLISM, HX OF (ICD-V12.51).Marland Kitchenand DVT.Marland Kitchen no Coumadin because of subarachnoid hemorrhage HYPERTENSION (ICD-401.9) HYPERLIPIDEMIA (ICD-272.4) DVT, HX OF (ICD-V12.51) CONGESTIVE HEART FAILURE  (ICD-428.0) Atrial fibrillation...noted early 2011..can not take coumadin.. meds adjusted for rate. / Holter monitor Jun 27, 2009.. no rapid heart rate.... mild bradycardia at times.... digoxin stopped Jul 08, 2009  /   September, 2011.... shortness of breath with palpitations when walking... decision to resume low-dose digoxin    Family History: Last updated: 08/11/2006 Family History of Aneurysm Aortic Family History Breast cancer 1st degree relative <50 Family History of CAD Female 1st degree relative <60 Family History of Prostate CA 1st degree relative <50 Family History of Stroke M 1st degree relative <50 Family History Diabetes 1st degree relative  Social History: Last updated: 08/11/2006 Retired--  hotels Divorced Never Smoked Alcohol use-yes Drug use-no Regular exercise-yes  Risk Factors: Alcohol Use: <1 (12/06/2009) Caffeine Use: 2 (12/06/2009) Exercise: yes (12/06/2009)  Risk Factors: Smoking Status: never (12/06/2009)  Past Surgical History: Cataract extraction Cholecystectomy PE--- vena cava filter Hip fracture ORIF (07/2007) epidural injection 11/2009  Family History: Reviewed history from 08/11/2006 and no changes required. Family History of Aneurysm Aortic Family History Breast cancer 1st degree relative <50 Family History of CAD Female 1st degree relative <60 Family History of Prostate CA 1st degree relative <50 Family History of Stroke M 1st degree relative <50 Family History Diabetes 1st degree relative  Social History: Reviewed history from 08/11/2006 and no changes required. 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 Eyes:  Complains of blurring; denies discharge, double vision, eye irritation, eye pain, halos, itching, light sensitivity, red  eye, vision loss-1 eye, and vision loss-both eyes; optho . ENT:  Denies decreased hearing, difficulty swallowing, ear discharge, earache, hoarseness, nasal congestion, nosebleeds, postnasal drainage, ringing in ears, sinus pressure, and sore throat. CV:  Denies bluish discoloration of lips or nails, chest pain or discomfort, difficulty breathing at night, difficulty breathing while lying down, fainting, fatigue, leg cramps with exertion, lightheadness, near fainting, palpitations, shortness of breath with exertion, swelling of feet, swelling of hands, and weight gain. Resp:  Denies chest discomfort, chest pain with inspiration, cough, coughing up blood, excessive snoring, hypersomnolence, morning headaches, pleuritic, shortness of breath, sputum productive, and wheezing. GI:  Denies abdominal pain, bloody stools, change in bowel habits, constipation, dark tarry stools, diarrhea, excessive appetite, gas, hemorrhoids, indigestion, loss of appetite, and nausea. GU:  Denies abnormal vaginal bleeding, decreased libido, discharge, dysuria, genital sores, hematuria, incontinence, nocturia, urinary frequency, and urinary hesitancy. MS:  Complains of low back pain; denies joint redness, joint swelling, loss of strength, mid back pain, muscle aches, muscle , cramps, muscle weakness, stiffness, and thoracic pain; Dr Ethelene Hal and Charlann Boxer. Derm:  Denies changes in color of skin, changes in nail beds, dryness, excessive perspiration, flushing, hair loss, insect bite(s), itching, lesion(s), poor wound healing, and rash. Neuro:  Denies brief paralysis, difficulty with concentration, disturbances in coordination, falling down, headaches, inability to speak, memory loss, numbness, poor balance, seizures, sensation of room spinning, tingling, tremors, visual disturbances, and weakness. Psych:  Denies alternate hallucination ( auditory/visual), anxiety, depression, easily angered, easily  tearful, irritability, mental problems, panic  attacks, sense of great danger, suicidal thoughts/plans, thoughts of violence, unusual visions or sounds, and thoughts /plans of harming others. Endo:  Denies cold intolerance, excessive hunger, excessive thirst, excessive urination, heat intolerance, polyuria, and weight change. Heme:  Denies abnormal bruising, bleeding, enlarge lymph nodes, fevers, pallor, and skin discoloration. Allergy:  Denies hives or rash, itching eyes, persistent infections, seasonal allergies, and sneezing.  Physical Exam  General:  Well-developed,well-nourished,in no acute distress; alert,appropriate and cooperative throughout examination Head:  Normocephalic and atraumatic without obvious abnormalities. No apparent alopecia or balding. Eyes:  pupils equal, pupils round, pupils reactive to light, and no injection.   Ears:  External ear exam shows no significant lesions or deformities.  Otoscopic examination reveals clear canals, tympanic membranes are intact bilaterally without bulging, retraction, inflammation or discharge. Hearing is grossly normal bilaterally. Nose:  External nasal examination shows no deformity or inflammation. Nasal mucosa are pink and moist without lesions or exudates. Mouth:  Oral mucosa and oropharynx without lesions or exudates.  Teeth in good repair. Neck:  No deformities, masses, or tenderness noted. Breasts:  refused Lungs:  Normal respiratory effort, chest expands symmetrically. Lungs are clear to auscultation, no crackles or wheezes. Heart:  irreg irreg Abdomen:  Bowel sounds positive,abdomen soft and non-tender without masses, organomegaly or hernias noted. Rectal:  refused Genitalia:  refused Msk:  No deformity or scoliosis noted of thoracic or lumbar spine.   Pulses:  R and L carotid,radial,femoral,dorsalis pedis and posterior tibial pulses are full and equal bilaterally Extremities:  No clubbing, cyanosis, edema, or deformity noted with normal full range of motion of all joints.     Neurologic:  No cranial nerve deficits noted. Station and gait are normal. Plantar reflexes are down-going bilaterally. DTRs are symmetrical throughout. Sensory, motor and coordinative functions appear intact. Skin:  Intact without suspicious lesions or rashes Psych:  Cognition and judgment appear intact. Alert and cooperative with normal attention span and concentration. No apparent delusions, illusions, hallucinations   Impression & Recommendations:  Problem # 1:  PREVENTIVE HEALTH CARE (ICD-V70.0) ghm utd labs  Problem # 2:  RENAL INSUFFICIENCY (ICD-588.9)  Problem # 3:  ATRIAL FIBRILLATION WITH RAPID VENTRICULAR RESPONSE (ICD-427.31)  Her updated medication list for this problem includes:    Metoprolol Succinate 25 Mg Xr24h-tab (Metoprolol succinate) .Marland Kitchen... Take one tablet by mouth daily    Amlodipine Besylate 5 Mg Tabs (Amlodipine besylate) .Marland Kitchen... Take one tablet by mouth daily    Diltiazem Hcl Er Beads 240 Mg Xr24h-cap (Diltiazem hcl er beads) .Marland Kitchen... 1 by mouth daily    Digoxin 0.125 Mg Tabs (Digoxin) .Marland Kitchen... Take a 1/2  tablet by mouth daily 4 days a week  Problem # 4:  MITRAL REGURGITATION (ICD-396.3)  Her updated medication list for this problem includes:    Metoprolol Succinate 25 Mg Xr24h-tab (Metoprolol succinate) .Marland Kitchen... Take one tablet by mouth daily  Echocardiogram:  Study Conclusions    - Left ventricle: The cavity size was normal. There was mild focal     basal hypertrophy of the septum. Systolic function was normal. The     estimated ejection fraction was in the range of 55% to 60%.   - Aortic valve: Mild regurgitation. Valve area: 1.9cm^2(VTI). Valve     area: 1.77cm^2 (Vmax).   - Mitral valve: Mild regurgitation.   - Left atrium: The atrium was mildly dilated.   - Right ventricle: The cavity size was mildly dilated.   - Right atrium: The atrium was  mildly dilated.   - Atrial septum: No defect or patent foramen ovale was identified.   Transthoracic  echocardiography. M-mode, complete 2D, spectral   Doppler, and color Doppler. Height: Height: 160cm. Height: 63in.   Weight: Weight: 73kg. Weight: 160.7lb. Body mass index: BMI:   28.5kg/m^2. Body surface area: BSA: 1.61m^2. Blood pressure: 142/80.   Patient status: Outpatient. Location: [facility]  (04/12/2009)  Problem # 5:  AORTIC INSUFFICIENCY (ICD-424.1)  Her updated medication list for this problem includes:    Metoprolol Succinate 25 Mg Xr24h-tab (Metoprolol succinate) .Marland Kitchen... Take one tablet by mouth daily  Echocardiogram:  Study Conclusions    - Left ventricle: The cavity size was normal. There was mild focal     basal hypertrophy of the septum. Systolic function was normal. The     estimated ejection fraction was in the range of 55% to 60%.   - Aortic valve: Mild regurgitation. Valve area: 1.9cm^2(VTI). Valve     area: 1.77cm^2 (Vmax).   - Mitral valve: Mild regurgitation.   - Left atrium: The atrium was mildly dilated.   - Right ventricle: The cavity size was mildly dilated.   - Right atrium: The atrium was mildly dilated.   - Atrial septum: No defect or patent foramen ovale was identified.   Transthoracic echocardiography. M-mode, complete 2D, spectral   Doppler, and color Doppler. Height: Height: 160cm. Height: 63in.   Weight: Weight: 73kg. Weight: 160.7lb. Body mass index: BMI:   28.5kg/m^2. Body surface area: BSA: 1.88m^2. Blood pressure: 142/80.   Patient status: Outpatient. Location: [facility]  (04/12/2009)  Problem # 6:  OSTEOPENIA (ICD-733.90)  Her updated medication list for this problem includes:    Calcium Carbonate-vitamin D 600-400 Mg-unit Tabs (Calcium carbonate-vitamin d) ..... Once daily  Bone Density: abnormal (02/20/2009) Vit D:67 (10/11/2008), 63 (05/31/2008)  Orders: Venipuncture ([id number]) T-Vitamin D (25-Hydroxy) ([id number])  Problem # 7:  HYPOTHYROIDISM (ICD-244.9)  Her updated medication list for this problem includes:     Synthroid 88 Mcg Tabs (Levothyroxine sodium) ..... Once daily  Orders: Venipuncture (95621) T-Vitamin D (25-Hydroxy) [phone])  Labs Reviewed: TSH: 1.16 (04/10/2009)    Chol: 166 (03/15/2009)   HDL: 72 (03/15/2009)   LDL: 79 (03/15/2009)   TG: 75 (03/15/2009)  Problem # 8:  ANEMIA, B12 DEFICIENCY (ICD-281.1)  Orders: Venipuncture (62952) T-Vitamin D (25-Hydroxy) (84132-44010)  Hgb: 13.0 (06/06/2009)   Hct: 38.9 (06/06/2009)   Platelets: 149.0 (06/06/2009) RBC: 4.05 (06/06/2009)   RDW: 14.9 (06/06/2009)   WBC: 6.4 (06/06/2009) MCV: 96.2 (06/06/2009)   MCHC: 33.5 (06/06/2009) Ferritin: 22.8 (06/06/2009) Iron: 94 (06/06/2009)   TIBC: 318 (03/15/2009)   % Sat: 20.5 (06/06/2009) B12: >1500 pg/mL (06/06/2009)   Folate: >20.0 ng/mL (06/06/2009)   TSH: 1.16 (04/10/2009)  Problem # 9:  HYPERTENSION (ICD-401.9)  Her updated medication list for this problem includes:    Furosemide 40 Mg Tabs (Furosemide) .Marland Kitchen... Take 1 & 1/2 tab daily    Metoprolol Succinate 25 Mg Xr24h-tab (Metoprolol succinate) .Marland Kitchen... Take one tablet by mouth daily    Amlodipine Besylate 5 Mg Tabs (Amlodipine besylate) .Marland Kitchen... Take one tablet by mouth daily    Diltiazem Hcl Er Beads 240 Mg Xr24h-cap (Diltiazem hcl er beads) .Marland Kitchen... 1 by mouth daily  Orders: Venipuncture (27253) T-Vitamin D (25-Hydroxy) ([id number])  BP today: 138/82 Prior BP: 132/80 (10/15/2009)  Labs Reviewed: K+: 3.9 (08/16/2009) Creat: : 2.0 (08/16/2009)   Chol: 166 (03/15/2009)   HDL: 72 (03/15/2009)   LDL: 79 (03/15/2009)   TG: 75 (  03/15/2009)  Problem # 10:  HYPERLIPIDEMIA (ICD-272.4)  Orders: Venipuncture (29562) T-Vitamin D (25-Hydroxy) (13086-57846)  Labs Reviewed: SGOT: 32 (04/10/2009)   SGPT: 18 (04/10/2009)   HDL:72 (03/15/2009), 68.90 (05/31/2008)  LDL:79 (03/15/2009), 84 (05/31/2008)  Chol:166 (03/15/2009), 172 (05/31/2008)  Trig:75 (03/15/2009), 98.0 (05/31/2008)  Complete Medication List: 1)  Synthroid 88 Mcg Tabs  (Levothyroxine sodium) .... Once daily 2)  Furosemide 40 Mg Tabs (Furosemide) .... Take 1 & 1/2 tab daily 3)  Nexium 40 Mg Cpdr (Esomeprazole magnesium) .Marland Kitchen.. 1 by mouth two times a day 4)  Metoprolol Succinate 25 Mg Xr24h-tab (Metoprolol succinate) .... Take one tablet by mouth daily 5)  Calcium Carbonate-vitamin D 600-400 Mg-unit Tabs (Calcium carbonate-vitamin d) .... Once daily 6)  Vitamin B Complex-c Caps (B complex-c) .... Once daily 7)  Amlodipine Besylate 5 Mg Tabs (Amlodipine besylate) .... Take one tablet by mouth daily 8)  Diltiazem Hcl Er Beads 240 Mg Xr24h-cap (Diltiazem hcl er beads) .Marland Kitchen.. 1 by mouth daily 9)  Osteo Bi-flex Adv Triple St Tabs (Misc natural products) .... Take 1 tablet by mouth once a day 10)  Multivitamins Tabs (Multiple vitamin) .... W/ iron --- once daily 11)  Melatonin 5 Mg Tabs (Melatonin) .... Take 1 tablet by mouth once a day 12)  Acidophilus Probiotic Blend Caps (Probiotic product) .Marland Kitchen.. 1 by mouth daily 13)  Acetaminophen 325 Mg Tabs (Acetaminophen) .... As needed 14)  Digoxin 0.125 Mg Tabs (Digoxin) .... Take a 1/2  tablet by mouth daily 4 days a week  Other Orders: Medicare -1st Annual Wellness Visit [phone]) EKG w/ Interpretation (93000)   Orders Added: 1)  Venipuncture [36415] 2)  T-Vitamin D (25-Hydroxy) [28413-24401] 3)  Medicare -1st Annual Wellness Visit [G0438] 4)  EKG w/ Interpretation [93000]    Colonoscopy Next Due:  Refused PAP Next Due:  Refused Mammogram Next Due:  Refused Bone Density Result Date:  02/20/2009 Bone Density Result:  abnormal Bone Density Next Due: 2 yr    Laboratory Results   Urine Tests   Date/Time Reported: December 06, 2009 3:33 PM  Routine Urinalysis   Color: yellow Appearance: Clear Glucose: negative   (Normal Range: Negative) Bilirubin: negative   (Normal Range: Negative) Ketone: moderate (40)   (Normal Range: Negative) Spec. Gravity: >=1.030   (Normal Range: 1.003-1.035) Blood: negative    (Normal Range: Negative) pH: 5.0   (Normal Range: 5.0-8.0) Protein: >=300   (Normal Range: Negative) Urobilinogen: 0.2   (Normal Range: 0-1) Nitrite: negative   (Normal Range: Negative) Leukocyte Esterace: negative   (Normal Range: Negative)    Comments: Not enough for culture    Appended Document: cpx /b12/cbs     Does patient need assistance? Functional Status Self care, Cook/clean, Shopping, Social activities Ambulation Normal Comments Pt able to do all adls and can read and write   Preventive Screening-Counseling & Management  Safety-Violence-Falls     Firearms in the Home: no firearms in the home     Smoke Detectors: yes     Violence in the Home: no risk noted     Sexual Abuse: no     Fall Risk: no  Allergies: 1)  ! Cipro (Ciprofloxacin Hcl)  Social History: Fall Risk:  no   Complete Medication List: 1)  Synthroid 88 Mcg Tabs (Levothyroxine sodium) .... Once daily 2)  Furosemide 40 Mg Tabs (Furosemide) .... Take 1 & 1/2 tab daily 3)  Nexium 40 Mg Cpdr (Esomeprazole magnesium) .Marland Kitchen.. 1 by mouth two times a day 4)  Metoprolol Succinate  25 Mg Xr24h-tab (Metoprolol succinate) .... Take one tablet by mouth daily 5)  Calcium Carbonate-vitamin D 600-400 Mg-unit Tabs (Calcium carbonate-vitamin d) .... Once daily 6)  Vitamin B Complex-c Caps (B complex-c) .... Once daily 7)  Amlodipine Besylate 5 Mg Tabs (Amlodipine besylate) .... Take one tablet by mouth daily 8)  Diltiazem Hcl Er Beads 240 Mg Xr24h-cap (Diltiazem hcl er beads) .Marland Kitchen.. 1 by mouth daily 9)  Osteo Bi-flex Adv Triple St Tabs (Misc natural products) .... Take 1 tablet by mouth once a day 10)  Multivitamins Tabs (Multiple vitamin) .... W/ iron --- once daily 11)  Melatonin 5 Mg Tabs (Melatonin) .... Take 1 tablet by mouth once a day 12)  Acidophilus Probiotic Blend Caps (Probiotic product) .Marland Kitchen.. 1 by mouth daily 13)  Acetaminophen 325 Mg Tabs (Acetaminophen) .... As needed 14)  Digoxin 0.125 Mg Tabs (Digoxin)  .... Take a 1/2  tablet by mouth daily 4 days a week  Appended Document: cpx /b12/cbs     Vital Signs:  Patient profile:   77 year old female Height:      63 inches Weight:      154.50 pounds BMI:     27.47  Allergies: 1)  ! Cipro (Ciprofloxacin Hcl)   Complete Medication List: 1)  Synthroid 88 Mcg Tabs (Levothyroxine sodium) .... Once daily 2)  Furosemide 40 Mg Tabs (Furosemide) .... Take 1 & 1/2 tab daily 3)  Nexium 40 Mg Cpdr (Esomeprazole magnesium) .Marland Kitchen.. 1 by mouth two times a day 4)  Metoprolol Succinate 25 Mg Xr24h-tab (Metoprolol succinate) .... Take one tablet by mouth daily 5)  Calcium Carbonate-vitamin D 600-400 Mg-unit Tabs (Calcium carbonate-vitamin d) .... Once daily 6)  Vitamin B Complex-c Caps (B complex-c) .... Once daily 7)  Amlodipine Besylate 5 Mg Tabs (Amlodipine besylate) .... Take one tablet by mouth daily 8)  Diltiazem Hcl Er Beads 240 Mg Xr24h-cap (Diltiazem hcl er beads) .Marland Kitchen.. 1 by mouth daily 9)  Osteo Bi-flex Adv Triple St Tabs (Misc natural products) .... Take 1 tablet by mouth once a day 10)  Multivitamins Tabs (Multiple vitamin) .... W/ iron --- once daily 11)  Melatonin 5 Mg Tabs (Melatonin) .... Take 1 tablet by mouth once a day 12)  Acidophilus Probiotic Blend Caps (Probiotic product) .Marland Kitchen.. 1 by mouth daily 13)  Acetaminophen 325 Mg Tabs (Acetaminophen) .... As needed 14)  Digoxin 0.125 Mg Tabs (Digoxin) .... Take a 1/2  tablet by mouth daily 4 days a week

## 2010-03-13 NOTE — Assessment & Plan Note (Signed)
Summary: b 12 inj/cbs   Nurse Visit   Allergies: 1)  ! Cipro (Ciprofloxacin Hcl)  Medication Administration  Injection # 1:    Medication: Vit B12 1000 mcg    Diagnosis: B12 DEFICIENCY (ICD-266.2)    Route: IM    Site: R deltoid    Exp Date: 12/2010    Lot #: [id number]    Mfr: [employer]    Patient tolerated injection without complications    Given by: Army Fossa CMA (June 03, 2009 2:53 PM)  Orders Added: 1)  Admin of Therapeutic Inj  intramuscular or subcutaneous [[id number]] 2)  Vit B12 1000 mcg [[id number]]

## 2010-03-13 NOTE — Miscellaneous (Signed)
  Clinical Lists Changes  Observations: Added new observation of PAST MED HX:  Osteopenia  URI (ICD-465.9) OSTEOPENIA (ICD-733.90) CHEST XRAY, ABNORMAL (ICD-793.1) CERVICAL CANCER, HX OF (ICD-V10.41) B12 DEFICIENCY (ICD-266.2) BENIGN NEOPLASM OF ADRENAL GLAND (ICD-227.0) HYPOTHYROIDISM (ICD-244.9) URINARY INCONTINENCE (ICD-788.30) OSTEOARTHRITIS (ICD-715.90) GERD (ICD-530.81) COPD (ICD-496) EFFUSION, PLEURAL (ICD-511.9) CHEST PAIN (ICD-786.50)....negative Cardiolite in the past..... ANEMIA, B12 DEFICIENCY (ICD-281.1) FAMILY HISTORY DIABETES 1ST DEGREE RELATIVE (ICD-V18.0) FAMILY HISTORY OF CAD FEMALE 1ST DEGREE RELATIVE <60 (ICD-V16.49) FAMILY HISTORY BREAST CANCER 1ST DEGREE RELATIVE <50 (ICD-V16.3) FAMILY HISTORY OF ANEURYSM AORTIC (ICD-V17.4) BREAST CYST, LEFT (ICD-610.0) FOOT SURGERY, HX OF (ICD-V15.89) HEMORRHAGE, SUBARACHNOID (ICD-430)... therefore no Coumadin...or aspirin.. Dr. Dutch Quint neurosurgery .. aneurysm.. no treatment EF        65%  / EF 60%... echo.... November, 2010  /   55-60%...echo..04/12/2009 Aortic insufficiency... mild..echo... 2007 /   mild...echo...04/12/2009 Mitral regurgitation... mild  /  mild... echo... November, 2010 /  mild...echo...04/12/2009 Aortic root dilatation... mild  /  4.5 cm... stable... echo... November, 2010 /  no mention....echo...04/12/2009 DIZZINESS (ICD-780.4) ANEMIA NOS (ICD-285.9) RENAL INSUFFICIENCY (ICD-588.9) PULMONARY EMBOLISM, HX OF (ICD-V12.51).Marland Kitchenand DVT.Marland Kitchen no Coumadin because of subarachnoid hemorrhage HYPERTENSION (ICD-401.9) HYPERLIPIDEMIA (ICD-272.4) DVT, HX OF (ICD-V12.51) CONGESTIVE HEART FAILURE (ICD-428.0) Atrial fibrillation...noted early 2011..can not take coumadin.. meds adjusted for rate. / Holter monitor Jun 27, 2009.. no rapid heart rate.... mild bradycardia at times.... digoxin stopped Jul 08, 2009   (10/14/2009 13:07) Added new observation of REFERRING MD: Kimbrough (10/14/2009 13:07) Added new observation of PRIMARY  MD: Loreen Freud DO (10/14/2009 13:07)       Past History:  Past Medical History:  Osteopenia  URI (ICD-465.9) OSTEOPENIA (ICD-733.90) CHEST XRAY, ABNORMAL (ICD-793.1) CERVICAL CANCER, HX OF (ICD-V10.41) B12 DEFICIENCY (ICD-266.2) BENIGN NEOPLASM OF ADRENAL GLAND (ICD-227.0) HYPOTHYROIDISM (ICD-244.9) URINARY INCONTINENCE (ICD-788.30) OSTEOARTHRITIS (ICD-715.90) GERD (ICD-530.81) COPD (ICD-496) EFFUSION, PLEURAL (ICD-511.9) CHEST PAIN (ICD-786.50)....negative Cardiolite in the past..... ANEMIA, B12 DEFICIENCY (ICD-281.1) FAMILY HISTORY DIABETES 1ST DEGREE RELATIVE (ICD-V18.0) FAMILY HISTORY OF CAD FEMALE 1ST DEGREE RELATIVE <60 (ICD-V16.49) FAMILY HISTORY BREAST CANCER 1ST DEGREE RELATIVE <50 (ICD-V16.3) FAMILY HISTORY OF ANEURYSM AORTIC (ICD-V17.4) BREAST CYST, LEFT (ICD-610.0) FOOT SURGERY, HX OF (ICD-V15.89) HEMORRHAGE, SUBARACHNOID (ICD-430)... therefore no Coumadin...or aspirin.. Dr. Dutch Quint neurosurgery .. aneurysm.. no treatment EF        65%  / EF 60%... echo.... November, 2010  /   55-60%...echo..04/12/2009 Aortic insufficiency... mild..echo... 2007 /   mild...echo...04/12/2009 Mitral regurgitation... mild  /  mild... echo... November, 2010 /  mild...echo...04/12/2009 Aortic root dilatation... mild  /  4.5 cm... stable... echo... November, 2010 /  no mention....echo...04/12/2009 DIZZINESS (ICD-780.4) ANEMIA NOS (ICD-285.9) RENAL INSUFFICIENCY (ICD-588.9) PULMONARY EMBOLISM, HX OF (ICD-V12.51).Marland Kitchenand DVT.Marland Kitchen no Coumadin because of subarachnoid hemorrhage HYPERTENSION (ICD-401.9) HYPERLIPIDEMIA (ICD-272.4) DVT, HX OF (ICD-V12.51) CONGESTIVE HEART FAILURE (ICD-428.0) Atrial fibrillation...noted early 2011..can not take coumadin.. meds adjusted for rate. / Holter monitor Jun 27, 2009.. no rapid heart rate.... mild bradycardia at times.... digoxin stopped Jul 08, 2009

## 2010-03-13 NOTE — Progress Notes (Signed)
Summary: update   Phone Note Outgoing Call   Call placed by: Meredith Staggers, RN,  July 25, 2009 1:52 PM Call placed to: Patient Summary of Call: called pt to check on her since wt was up last week, she states wt came down and then went back up she did lose about 3lbs overnight last night, she can tell slight improvement in her legs but are still swollen, she generally feels bad, she has cont. taking the lasix at 60mg  two times a day and now has cramps in her legs last pm, will discuss info w/dr Myrtis Ser and call her back   Follow-up for Phone Call        Change to 60mg  daily with 60BID two days/week. Remind to limit fluid intake  Talitha Givens, MD, [facility]  July 25, 2009 5:08 PM  Left message to call back Meredith Staggers, RN  July 25, 2009 5:14 PM   Returning call, Migdalia Dk  July 26, 2009 8:28 AM   Additional Follow-up for Phone Call Additional follow up Details #1::        pt aware and agreeable, will take lasix 60 mg daily and two times a day on Mon and Quincy Carnes, RN  July 26, 2009 11:19 AM

## 2010-03-13 NOTE — Assessment & Plan Note (Signed)
Summary: pain in right side/b 12 shot/kdc   Vital Signs:  Patient profile:   77 year old female Weight:      156 pounds Temp:     98.1 degrees F oral Pulse rate:   90 / minute Pulse rhythm:   regular BP sitting:   126 / 80  (left arm) Cuff size:   regular  Vitals Entered By: Army Fossa CMA (February 13, 2009 9:48 AM) CC: Pt c/o of pain in right hip, states that the pain medicine helps but not a lot.    History of Present Illness: Pt here c/o extreme fatigue and DOE.  Pt seen by cardio and was told her heart has not changed.  Pt is very frustrated.  She is also c/o increased R hip pain.  Tramadol helps some but not a lot.  No new injury.  Current Medications (verified): 1)  Omeprazole 40 Mg  Cpdr (Omeprazole) .Marland Kitchen.. 1 By Mouth Once Daily 2)  Synthroid 100 Mcg Tabs (Levothyroxine Sodium) .... Take One Tablet By Mouth Once Daily. 3)  Zocor 80 Mg  Tabs (Simvastatin) .... 1/2 At Bedtime 4)  Vitamin B-6 Cr 200 Mg Cr-Tabs (Pyridoxine Hcl) .... Take 1 Tab Once Daily 5)  Vitamin B-12   Tabs (Cyanocobalamin Tabs) .... Take 1 Tablet By Mouth Two Times A Day 6)  Caltrate 600+d   Tabs (Calcium Carbonate-Vitamin D Tabs) .... Take 1 Tablet By Mouth Three Times A Day 7)  Norvasc 10 Mg Tabs (Amlodipine Besylate) .... Take 1 Tab Once Daily 8)  Fosamax 70 Mg Tabs (Alendronate Sodium) .Marland Kitchen.. 1 By Mouth Weekly 9)  Osteo Bi-Flex .... Take 1 Tablet By Mouth Two Times A Day 10)  Stool Softner .... Take 1 Tablet By Mouth Once A Day 11)  Geritol Complete   Tabs (Iron-Vitamins) .... Take 1 Tablet By Mouth Once A Day 12)  Furosemide 40 Mg  Tabs (Furosemide) .... Take One Tablet Every Morning. 13)  Vitamin C & E .... 1 By Mouth Once Daily 14)  Omega-3 Fish Oil 1200 Mg Caps (Omega-3 Fatty Acids) .... Take 1 Tab Two Times A Day 15)  Tylenol 8 Hour 650 Mg  Cr-Tabs (Acetaminophen) .Marland Kitchen.. 1 By Mouth Every 8 Hours As Needed 16)  Antivert 25 Mg Tabs (Meclizine Hcl) .Marland Kitchen.. 1 By Mouth Three Times A Day As Needed 17)   Mucinex 600 Mg Xr12h-Tab (Guaifenesin) 18)  Amitriptyline Hcl 10 Mg Tabs (Amitriptyline Hcl) .Marland Kitchen.. 1 By Mouth At Bedtime As Needed 19)  Icaps  Caps (Multiple Vitamins-Minerals) .... Take 1 Tab in Am 1 Tab Pm 20)  Vitamin D3 1000 Unit Tabs (Cholecalciferol) .... Take 1 Tab Once Daily 21)  Eql Vision Formula  Tabs (Multiple Vitamins-Minerals) .... Take 1 Tab Once Daily 22)  Glucosamine 1500 Complex  Caps (Glucosamine-Chondroit-Vit C-Mn) .... Take 1 Tab Two Times A Day 23)  B Complex-C  Tabs (B Complex-C) .... Take 1 Tab Once Daily 24)  Amlodipine Besylate 10 Mg Tabs (Amlodipine Besylate) .... Take One Tablet By Mouth Daily 25)  Amitriptyline Hcl 10 Mg Tabs (Amitriptyline Hcl) .... As Needed 26)  Ultram 50 Mg Tabs (Tramadol Hcl) .Marland Kitchen.. 1-2 By Mouth Every 6 Hours As Needed 27)  Vision Formula  Tabs (Multiple Vitamins-Minerals) .... Take One Tablet By Mouth Once Daily. 28)  Osteo Bi-Flex Regular Strength 250-200 Mg Tabs (Glucosamine-Chondroitin) .... Take One Tablet By Mouth Once Daily. 29)  Detrol La 4 Mg Xr24h-Cap (Tolterodine Tartrate) .Marland Kitchen.. 1 By Mouth Once Daily  Allergies (  verified): No Known Drug Allergies  Past History:  Family History: Last updated: 08/11/2006 Family History of Aneurysm Aortic Family History Breast cancer 1st degree relative <50 Family History of CAD Female 1st degree relative <60 Family History of Prostate CA 1st degree relative <50 Family History of Stroke M 1st degree relative <50 Family History Diabetes 1st degree relative  Social History: Last updated: 08/11/2006 Retired--  hotels Divorced Never Smoked Alcohol use-yes Drug use-no Regular exercise-yes  Risk Factors: Alcohol Use: <1 (08/11/2006) Caffeine Use: 2 (08/11/2006) Exercise: yes (08/11/2006)  Risk Factors: Smoking Status: never (08/11/2006)  Past medical, surgical, family and social histories (including risk factors) reviewed for relevance to current acute and chronic problems.  Past Medical  History: Reviewed history from 12/24/2008 and no changes required.  Osteopenia  URI (ICD-465.9) OSTEOPENIA (ICD-733.90) CHEST XRAY, ABNORMAL (ICD-793.1) CERVICAL CANCER, HX OF (ICD-V10.41) B12 DEFICIENCY (ICD-266.2) BENIGN NEOPLASM OF ADRENAL GLAND (ICD-227.0) HYPOTHYROIDISM (ICD-244.9) URINARY INCONTINENCE (ICD-788.30) OSTEOARTHRITIS (ICD-715.90) GERD (ICD-530.81) COPD (ICD-496) EFFUSION, PLEURAL (ICD-511.9) CHEST PAIN (ICD-786.50)....negative Cardiolite in the past..... ANEMIA, B12 DEFICIENCY (ICD-281.1) FAMILY HISTORY DIABETES 1ST DEGREE RELATIVE (ICD-V18.0) FAMILY HISTORY OF CAD FEMALE 1ST DEGREE RELATIVE <60 (ICD-V16.49) FAMILY HISTORY BREAST CANCER 1ST DEGREE RELATIVE <50 (ICD-V16.3) FAMILY HISTORY OF ANEURYSM AORTIC (ICD-V17.4) BREAST CYST, LEFT (ICD-610.0) FOOT SURGERY, HX OF (ICD-V15.89) HEMORRHAGE, SUBARACHNOID (ICD-430)... therefore no Coumadin...or aspirin.. Dr. Dutch Quint neurosurgery .. aneurysm.. no treatment Ejection fraction 65%  / EF 60%... echo.... November, 2010 Aortic insufficiency... mild..echo... 2007 Mitral regurgitation... mild  /  mild... echo... November, 2010 Aortic root dilatation... mild  /  4.5 cm... stable... echo... November, 2010 DIZZINESS (ICD-780.4) ANEMIA NOS (ICD-285.9) RENAL INSUFFICIENCY (ICD-588.9) PULMONARY EMBOLISM, HX OF (ICD-V12.51).Marland Kitchenand DVT.Marland Kitchen no Coumadin because of subarachnoid hemorrhage HYPERTENSION (ICD-401.9) HYPERLIPIDEMIA (ICD-272.4) DVT, HX OF (ICD-V12.51) CONGESTIVE HEART FAILURE (ICD-428.0)    Past Surgical History: Reviewed history from 09/05/2007 and no changes required. Cataract extraction Cholecystectomy PE--- vena cava filter Hip fracture ORIF (07/2007)  Family History: Reviewed history from 08/11/2006 and no changes required. Family History of Aneurysm Aortic Family History Breast cancer 1st degree relative <50 Family History of CAD Female 1st degree relative <60 Family History of Prostate CA 1st degree relative  <50 Family History of Stroke M 1st degree relative <50 Family History Diabetes 1st degree relative  Social History: Reviewed history from 08/11/2006 and no changes required. 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 Lungs:  Normal respiratory effort, chest expands symmetrically. Lungs are clear to auscultation, no crackles or wheezes. Heart:  normal rate and no murmur.   Extremities:  No clubbing, cyanosis, edema, or deformity noted with normal full range of motion of all joints.   Psych:  Oriented X3 and normally interactive.     Impression & Recommendations:  Problem # 1:  HIP PAIN, RIGHT (ICD-719.45)  Her updated medication list for this problem includes:    Tylenol 8 Hour 650 Mg Cr-tabs (Acetaminophen) .Marland Kitchen... 1 by mouth every 8 hours as needed    Ultram 50 Mg Tabs (Tramadol hcl) .Marland Kitchen... 1-2 by mouth every 6 hours as needed  Orders: T-Hip Comp Right Min 2 views (73510TC) Orthopedic Surgeon Referral (Ortho Surgeon)  Discussed use of medications, application of heat or cold, and exercises.   Problem # 2:  DYSPNEA/SHORTNESS OF BREATH (ICD-786.09)  Her updated  medication list for this problem includes:    Furosemide 40 Mg Tabs (Furosemide) .Marland Kitchen... Take one tablet every morning.  Recommended fluid and salt restriction.   Orders: T-2 View CXR (71020TC)  Problem # 3:  URINARY INCONTINENCE (ICD-788.30) con't Detrol  Complete Medication List: 1)  Omeprazole 40 Mg Cpdr (Omeprazole) .Marland Kitchen.. 1 by mouth once daily 2)  Synthroid 100 Mcg Tabs (Levothyroxine sodium) .... Take one tablet by mouth once daily. 3)  Zocor 80 Mg Tabs (Simvastatin) .... 1/2 at bedtime 4)  Vitamin B-6 Cr 200 Mg Cr-tabs (Pyridoxine hcl) ....  Take 1 tab once daily 5)  Vitamin B-12 Tabs (Cyanocobalamin tabs) .... Take 1 tablet by mouth two times a day 6)  Caltrate 600+d Tabs (Calcium carbonate-vitamin d tabs) .... Take 1 tablet by mouth three times a day 7)  Norvasc 10 Mg Tabs (Amlodipine besylate) .... Take 1 tab once daily 8)  Fosamax 70 Mg Tabs (Alendronate sodium) .Marland Kitchen.. 1 by mouth weekly 9)  Osteo Bi-flex  .... Take 1 tablet by mouth two times a day 10)  Stool Softner  .... Take 1 tablet by mouth once a day 11)  Geritol Complete Tabs (Iron-vitamins) .... Take 1 tablet by mouth once a day 12)  Furosemide 40 Mg Tabs (Furosemide) .... Take one tablet every morning. 13)  Vitamin C & E  .... 1 by mouth once daily 14)  Omega-3 Fish Oil 1200 Mg Caps (Omega-3 fatty acids) .... Take 1 tab two times a day 15)  Tylenol 8 Hour 650 Mg Cr-tabs (Acetaminophen) .Marland Kitchen.. 1 by mouth every 8 hours as needed 16)  Antivert 25 Mg Tabs (Meclizine hcl) .Marland Kitchen.. 1 by mouth three times a day as needed 17)  Mucinex 600 Mg Xr12h-tab (Guaifenesin) 18)  Amitriptyline Hcl 10 Mg Tabs (Amitriptyline hcl) .Marland Kitchen.. 1 by mouth at bedtime as needed 19)  Icaps Caps (Multiple vitamins-minerals) .... Take 1 tab in am 1 tab pm 20)  Vitamin D3 1000 Unit Tabs (Cholecalciferol) .... Take 1 tab once daily 21)  Eql Vision Formula Tabs (Multiple vitamins-minerals) .... Take 1 tab once daily 22)  Glucosamine 1500 Complex Caps (Glucosamine-chondroit-vit c-mn) .... Take 1 tab two times a day 23)  B Complex-c Tabs (B complex-c) .... Take 1 tab once daily 24)  Amlodipine Besylate 10 Mg Tabs (Amlodipine besylate) .... Take one tablet by mouth daily 25)  Amitriptyline Hcl 10 Mg Tabs (Amitriptyline hcl) .... As needed 26)  Ultram 50 Mg Tabs (Tramadol hcl) .Marland Kitchen.. 1-2 by mouth every 6 hours as needed 27)  Vision Formula Tabs (Multiple vitamins-minerals) .... Take one tablet by mouth once daily. 28)  Osteo Bi-flex Regular Strength 250-200 Mg Tabs (Glucosamine-chondroitin) .... Take one tablet by  mouth once daily. 29)  Detrol La 4 Mg Xr24h-cap (Tolterodine tartrate) .Marland Kitchen.. 1 by mouth once daily  Other Orders: Vit B12 1000 mcg (J3420) Admin of Therapeutic Inj  intramuscular or subcutaneous (81191)   Medication Administration  Injection # 1:    Medication: Vit B12 1000 mcg    Diagnosis: B12 DEFICIENCY (ICD-266.2)    Route: IM    Site: R deltoid    Exp Date: 07/2010    Lot #: [id number]    Mfr: [employer] Regent    Patient tolerated injection without complications    Given by: Army Fossa CMA (February 13, 2009 9:56 AM)  Orders Added: 1)  Vit B12 1000 mcg [J3420] 2)  Admin of Therapeutic Inj  intramuscular or subcutaneous [96372] 3)  T-Hip Comp Right Min  2 views [73510TC] 4)  Orthopedic Surgeon Referral [Ortho Surgeon] 5)  Est. Patient Level IV [16109] 6)  T-2 View CXR [71020TC]

## 2010-03-13 NOTE — Assessment & Plan Note (Signed)
Summary: atrial flutter  Medications Added ZOCOR 80 MG  TABS (SIMVASTATIN) ON HOLD AMLODIPINE BESYLATE 5 MG TABS (AMLODIPINE BESYLATE) Take one tablet by mouth daily CARDIZEM CD 180 MG XR24H-CAP (DILTIAZEM HCL COATED BEADS) take one capsule by mouth daily        Visit Type:  DOD ADD-ON Primary Provider:  Loreen Freud DO  CC:  palpitations...sob...edema/feet/legs/abdomen.  History of Present Illness: Patient was feeling better yesterday after a long ordeal in past few weeks.  She said she has no energy, and heart feels fast.  Found to be in atrial fibrillation at Dr. Ernst Spell office.  She has developed shortness of breath, swelling in the lower extremities, but she feels poorly.  She does feel better now than she did last three weeks.  She had decided to make an appointment to see Dr. Myrtis Ser.  Says she cannot take warfarin---see old records.  Prior bleed after being put for DVT.    Current Medications (verified): 1)  Synthroid 100 Mcg Tabs (Levothyroxine Sodium) .... Take One Tablet By Mouth Once Daily. 2)  Zocor 80 Mg  Tabs (Simvastatin) .... 1/2 At Bedtime 3)  Fosamax 70 Mg Tabs (Alendronate Sodium) .Marland Kitchen.. 1 By Mouth Weekly 4)  Furosemide 40 Mg  Tabs (Furosemide) .... Take One Tablet Every Morning. 5)  Antivert 25 Mg Tabs (Meclizine Hcl) .Marland Kitchen.. 1 By Mouth Three Times A Day As Needed 6)  Amitriptyline Hcl 10 Mg Tabs (Amitriptyline Hcl) .Marland Kitchen.. 1 By Mouth At Bedtime As Needed 7)  Vitamin D3 1000 Unit Tabs (Cholecalciferol) .... Take 1 Tab Once Daily 8)  Amlodipine Besylate 10 Mg Tabs (Amlodipine Besylate) .... Take One Tablet By Mouth Daily 9)  Ultram 50 Mg Tabs (Tramadol Hcl) .Marland Kitchen.. 1-2 By Mouth Every 6 Hours As Needed 10)  Detrol La 4 Mg Xr24h-Cap (Tolterodine Tartrate) .Marland Kitchen.. 1 By Mouth Once Daily 11)  Align  Caps (Probiotic Product) 12)  Nexium 40 Mg Cpdr (Esomeprazole Magnesium) .Marland Kitchen.. 1 By Mouth Once Daily  Allergies: 1)  ! Cipro (Ciprofloxacin Hcl)  Past History:  Past Medical  History: Last updated: 12/24/2008  Osteopenia  URI (ICD-465.9) OSTEOPENIA (ICD-733.90) CHEST XRAY, ABNORMAL (ICD-793.1) CERVICAL CANCER, HX OF (ICD-V10.41) B12 DEFICIENCY (ICD-266.2) BENIGN NEOPLASM OF ADRENAL GLAND (ICD-227.0) HYPOTHYROIDISM (ICD-244.9) URINARY INCONTINENCE (ICD-788.30) OSTEOARTHRITIS (ICD-715.90) GERD (ICD-530.81) COPD (ICD-496) EFFUSION, PLEURAL (ICD-511.9) CHEST PAIN (ICD-786.50)....negative Cardiolite in the past..... ANEMIA, B12 DEFICIENCY (ICD-281.1) FAMILY HISTORY DIABETES 1ST DEGREE RELATIVE (ICD-V18.0) FAMILY HISTORY OF CAD FEMALE 1ST DEGREE RELATIVE <60 (ICD-V16.49) FAMILY HISTORY BREAST CANCER 1ST DEGREE RELATIVE <50 (ICD-V16.3) FAMILY HISTORY OF ANEURYSM AORTIC (ICD-V17.4) BREAST CYST, LEFT (ICD-610.0) FOOT SURGERY, HX OF (ICD-V15.89) HEMORRHAGE, SUBARACHNOID (ICD-430)... therefore no Coumadin...or aspirin.. Dr. Dutch Quint neurosurgery .. aneurysm.. no treatment Ejection fraction 65%  / EF 60%... echo.... November, 2010 Aortic insufficiency... mild..echo... 2007 Mitral regurgitation... mild  /  mild... echo... November, 2010 Aortic root dilatation... mild  /  4.5 cm... stable... echo... November, 2010 DIZZINESS (ICD-780.4) ANEMIA NOS (ICD-285.9) RENAL INSUFFICIENCY (ICD-588.9) PULMONARY EMBOLISM, HX OF (ICD-V12.51).Marland Kitchenand DVT.Marland Kitchen no Coumadin because of subarachnoid hemorrhage HYPERTENSION (ICD-401.9) HYPERLIPIDEMIA (ICD-272.4) DVT, HX OF (ICD-V12.51) CONGESTIVE HEART FAILURE (ICD-428.0)    Past Surgical History: Last updated: 09/05/2007 Cataract extraction Cholecystectomy PE--- vena cava filter Hip fracture ORIF (07/2007)  Family History: Last updated: 08/11/2006 Family History of Aneurysm Aortic Family History Breast cancer 1st degree relative <50 Family History of CAD Female 1st degree relative <60 Family History of Prostate CA 1st degree relative <50 Family History of Stroke M 1st degree relative <50 Family History  Diabetes 1st degree  relative  Social History: Last updated: 08/11/2006 Retired--  hotels Divorced Never Smoked Alcohol use-yes Drug use-no Regular exercise-yes  Risk Factors: Alcohol Use: <1 (08/11/2006) Caffeine Use: 2 (08/11/2006) Exercise: yes (08/11/2006)  Review of Systems       The patient complains of weight gain, dyspnea on exertion, and peripheral edema.  The patient denies anorexia, fever, weight loss, vision loss, decreased hearing, hoarseness, chest pain, syncope, prolonged cough, headaches, hemoptysis, abdominal pain, melena, hematochezia, severe indigestion/heartburn, hematuria, incontinence, muscle weakness, suspicious skin lesions, transient blindness, depression, unusual weight change, abnormal bleeding, enlarged lymph nodes, and breast masses.    Vital Signs:  Patient profile:   77 year old female Height:      63 inches Weight:      161 pounds Pulse rate:   135 / minute Pulse rhythm:   irregular BP sitting:   142 / 80  (left arm) Cuff size:   large  Vitals Entered By: Danielle Rankin, CMA (April 10, 2009 2:18 PM)  Physical Exam  General:  Well developed, well nourished, in no acute distress. Head:  normocephalic and atraumatic Eyes:  PERRLA/EOM intact; conjunctiva and lids normal. Nose:  no deformity, discharge, inflammation, or lesions Mouth:  Teeth, gums and palate normal. Oral mucosa normal. Neck:  Neck supple, no JVD. No masses, thyromegaly or abnormal cervical nodes. Lungs:  OVerall relatively clear. Heart:  Rapid, irregularly irregular. Abdomen:  Bowel sounds positive; abdomen soft and non-tender without masses, organomegaly, or hernias noted. No hepatosplenomegaly. Pulses:  pulses normal in all 4 extremities Extremities:  Bilateral LE edema  1-2 plus Neurologic:  Alert and oriented x 3.   EKG  Procedure date:  04/10/2009  Findings:      Atrial fib with rapid ventricular response.  Impression & Recommendations:  Problem # 1:  ATRIAL FIBRILLATION WITH RAPID  VENTRICULAR RESPONSE (ICD-427.31)  New onset from prior.  Cannot get anticoagulants.  Has CHADS of 2-3.   Will add dilt, decrease Amlodipine, and have her followup soon with Dr. Myrtis Ser.  Orders: EKG w/ Interpretation (93000) T-2 View CXR (71020TC) Echocardiogram (Echo) TLB-T4 (Thyrox), Free [phone]) TLB-TSH (Thyroid Stimulating Hormone) (84443-TSH) TLB-BMP (Basic Metabolic Panel-BMET) (80048-METABOL) TLB-Hepatic/Liver Function Pnl (80076-HEPATIC) TLB-CBC Platelet - w/Differential (85025-CBCD)  Problem # 2:  HYPERLIPIDEMIA (ICD-272.4) Will hold simva, now with dilt.  Will need pravachol.  Her updated medication list for this problem includes:    Zocor 80 Mg Tabs (Simvastatin) ..... On hold  Patient Instructions: 1)  Your physician recommends that you schedule a follow-up appointment: Friday 04/12/09 with Dr Juanda Chance and Dr Myrtis Ser on 04/16/09  2)  Your physician recommends that you have lab work today: TSH, Free T4, BMP, LIVER, CBC 3)  Your physician has recommended you make the following change in your medication: DECREASE Amlodipine to 5mg  once a day, START Cardizem CD 180mg  once a day, STOP Simvastatin 4)  Your physician has requested that you have an echocardiogram on Friday.  Echocardiography is a painless test that uses sound waves to create images of your heart. It provides your doctor with information about the size and shape of your heart and how well your heart's chambers and valves are working.  This procedure takes approximately one hour. There are no restrictions for this procedure. 5)  Chest X-ray obtained in the office today. Prescriptions: CARDIZEM CD 180 MG XR24H-CAP (DILTIAZEM HCL COATED BEADS) take one capsule by mouth daily  #30 x 2   Entered by:   Julieta Gutting, RN,  BSN   Authorized by:   Ronaldo Miyamoto, MD, [facility]   Signed by:   Julieta Gutting, RN, BSN on 04/10/2009   Method used:   Electronically to        [id number] (retail)       [address]        [place], [place]  [address]       Ph: [phone] or [phone]       Fax: [phone]   RxID:   [id number] AMLODIPINE BESYLATE 5 MG TABS (AMLODIPINE BESYLATE) Take one tablet by mouth daily  #30 x 2   Entered by:   Julieta Gutting, RN, BSN   Authorized by:   Ronaldo Miyamoto, MD, [facility]   Signed by:   Julieta Gutting, RN, BSN on 04/10/2009   Method used:   Electronically to        [id number] (retail)       [address]       [place], [place]  [address]       Ph: [phone] or [phone]       Fax: [phone]   RxID:   [id number]

## 2010-03-13 NOTE — Assessment & Plan Note (Signed)
Summary: stomach issues//lch//b12 inj//lch   Vital Signs:  Patient profile:   77 year old female Weight:      159 pounds Temp:     98.3 degrees F oral Pulse rate:   85 / minute Pulse rhythm:   regular BP sitting:   122 / 80  (left arm) Cuff size:   regular  Vitals Entered By: Army Fossa CMA (March 15, 2009 2:16 PM) CC: stomach issues, Abdominal Pain   History of Present Illness:       This is an 77 year old woman who presents with Abdominal Pain.  The symptoms began 1 week ago.  Pt c/o severe pain in abd and woke up 2x with sweats.  The patient reports nausea and diarrhea, but denies vomiting, constipation, melena, hematochezia, anorexia, and hematemesis.  The location of the pain is right lower quadrant, left lower quadrant, and suprapubic.  The pain is described as intermittent and sharp.  The patient denies the following symptoms: fever, weight loss, dysuria, chest pain, jaundice, dark urine, missed menstrual period, and vaginal bleeding.    Current Medications (verified): 1)  Omeprazole 40 Mg  Cpdr (Omeprazole) .Marland Kitchen.. 1 By Mouth Once Daily 2)  Synthroid 100 Mcg Tabs (Levothyroxine Sodium) .... Take One Tablet By Mouth Once Daily. 3)  Zocor 80 Mg  Tabs (Simvastatin) .... 1/2 At Bedtime 4)  Vitamin B-6 Cr 200 Mg Cr-Tabs (Pyridoxine Hcl) .... Take 1 Tab Once Daily 5)  Vitamin B-12   Tabs (Cyanocobalamin Tabs) .... Take 1 Tablet By Mouth Two Times A Day 6)  Caltrate 600+d   Tabs (Calcium Carbonate-Vitamin D Tabs) .... Take 1 Tablet By Mouth Three Times A Day 7)  Norvasc 10 Mg Tabs (Amlodipine Besylate) .... Take 1 Tab Once Daily 8)  Fosamax 70 Mg Tabs (Alendronate Sodium) .Marland Kitchen.. 1 By Mouth Weekly 9)  Osteo Bi-Flex .... Take 1 Tablet By Mouth Two Times A Day 10)  Stool Softner .... Take 1 Tablet By Mouth Once A Day 11)  Geritol Complete   Tabs (Iron-Vitamins) .... Take 1 Tablet By Mouth Once A Day 12)  Furosemide 40 Mg  Tabs (Furosemide) .... Take One Tablet Every Morning. 13)   Vitamin C & E .... 1 By Mouth Once Daily 14)  Omega-3 Fish Oil 1200 Mg Caps (Omega-3 Fatty Acids) .... Take 1 Tab Two Times A Day 15)  Tylenol 8 Hour 650 Mg  Cr-Tabs (Acetaminophen) .Marland Kitchen.. 1 By Mouth Every 8 Hours As Needed 16)  Antivert 25 Mg Tabs (Meclizine Hcl) .Marland Kitchen.. 1 By Mouth Three Times A Day As Needed 17)  Mucinex 600 Mg Xr12h-Tab (Guaifenesin) 18)  Amitriptyline Hcl 10 Mg Tabs (Amitriptyline Hcl) .Marland Kitchen.. 1 By Mouth At Bedtime As Needed 19)  Icaps  Caps (Multiple Vitamins-Minerals) .... Take 1 Tab in Am 1 Tab Pm 20)  Vitamin D3 1000 Unit Tabs (Cholecalciferol) .... Take 1 Tab Once Daily 21)  Eql Vision Formula  Tabs (Multiple Vitamins-Minerals) .... Take 1 Tab Once Daily 22)  Glucosamine 1500 Complex  Caps (Glucosamine-Chondroit-Vit C-Mn) .... Take 1 Tab Two Times A Day 23)  B Complex-C  Tabs (B Complex-C) .... Take 1 Tab Once Daily 24)  Amlodipine Besylate 10 Mg Tabs (Amlodipine Besylate) .... Take One Tablet By Mouth Daily 25)  Amitriptyline Hcl 10 Mg Tabs (Amitriptyline Hcl) .... As Needed 26)  Ultram 50 Mg Tabs (Tramadol Hcl) .Marland Kitchen.. 1-2 By Mouth Every 6 Hours As Needed 27)  Vision Formula  Tabs (Multiple Vitamins-Minerals) .... Take One Tablet  By Mouth Once Daily. 28)  Osteo Bi-Flex Regular Strength 250-200 Mg Tabs (Glucosamine-Chondroitin) .... Take One Tablet By Mouth Once Daily. 29)  Detrol La 4 Mg Xr24h-Cap (Tolterodine Tartrate) .Marland Kitchen.. 1 By Mouth Once Daily  Allergies (verified): No Known Drug Allergies  Past History:  Past medical, surgical, family and social histories (including risk factors) reviewed for relevance to current acute and chronic problems.  Past Medical History: Reviewed history from 12/24/2008 and no changes required.  Osteopenia  URI (ICD-465.9) OSTEOPENIA (ICD-733.90) CHEST XRAY, ABNORMAL (ICD-793.1) CERVICAL CANCER, HX OF (ICD-V10.41) B12 DEFICIENCY (ICD-266.2) BENIGN NEOPLASM OF ADRENAL GLAND (ICD-227.0) HYPOTHYROIDISM (ICD-244.9) URINARY INCONTINENCE  (ICD-788.30) OSTEOARTHRITIS (ICD-715.90) GERD (ICD-530.81) COPD (ICD-496) EFFUSION, PLEURAL (ICD-511.9) CHEST PAIN (ICD-786.50)....negative Cardiolite in the past..... ANEMIA, B12 DEFICIENCY (ICD-281.1) FAMILY HISTORY DIABETES 1ST DEGREE RELATIVE (ICD-V18.0) FAMILY HISTORY OF CAD FEMALE 1ST DEGREE RELATIVE <60 (ICD-V16.49) FAMILY HISTORY BREAST CANCER 1ST DEGREE RELATIVE <50 (ICD-V16.3) FAMILY HISTORY OF ANEURYSM AORTIC (ICD-V17.4) BREAST CYST, LEFT (ICD-610.0) FOOT SURGERY, HX OF (ICD-V15.89) HEMORRHAGE, SUBARACHNOID (ICD-430)... therefore no Coumadin...or aspirin.. Dr. Dutch Quint neurosurgery .. aneurysm.. no treatment Ejection fraction 65%  / EF 60%... echo.... November, 2010 Aortic insufficiency... mild..echo... 2007 Mitral regurgitation... mild  /  mild... echo... November, 2010 Aortic root dilatation... mild  /  4.5 cm... stable... echo... November, 2010 DIZZINESS (ICD-780.4) ANEMIA NOS (ICD-285.9) RENAL INSUFFICIENCY (ICD-588.9) PULMONARY EMBOLISM, HX OF (ICD-V12.51).Marland Kitchenand DVT.Marland Kitchen no Coumadin because of subarachnoid hemorrhage HYPERTENSION (ICD-401.9) HYPERLIPIDEMIA (ICD-272.4) DVT, HX OF (ICD-V12.51) CONGESTIVE HEART FAILURE (ICD-428.0)    Past Surgical History: Reviewed history from 09/05/2007 and no changes required. Cataract extraction Cholecystectomy PE--- vena cava filter Hip fracture ORIF (07/2007)  Family History: Reviewed history from 08/11/2006 and no changes required. Family History of Aneurysm Aortic Family History Breast cancer 1st degree relative <50 Family History of CAD Female 1st degree relative <60 Family History of Prostate CA 1st degree relative <50 Family History of Stroke M 1st degree relative <50 Family History Diabetes 1st degree relative  Social History: Reviewed history from 08/11/2006 and no changes required. 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  Teeth in good repair. Lungs:  Normal respiratory effort, chest expands symmetrically. Lungs are clear to auscultation, no crackles or wheezes. Abdomen:  no guarding, no rigidity, no rebound tenderness, bowel sounds hyperactive, and distended.   Rectal:  No external abnormalities noted. Normal sphincter tone. No rectal masses or tenderness. heme negative brown stool Psych:  Cognition and judgment appear intact. Alert and cooperative with normal attention span and concentration. No apparent delusions, illusions, hallucinations   Impression & Recommendations:  Problem # 1:  ABDOMINAL PAIN OTHER SPECIFIED SITE (ICD-789.09)  check CT abd/pelvis if pain increases go to ER  Orders: Radiology Referral (Radiology)  Discussed use of medications, application of heat or cold, and exercises.   Problem # 2:  SKIN RASH (ICD-782.1)  ? shingles on buttock but it's been > 1 week con't abx cream if worsens or does not heal rto   Discussed medication use and symptom control.   Problem # 3:  FATIGUE (ICD-780.79)  Orders: Venipuncture (56213) T-Culture, Stool (87045/[id number]) T-Culture, C-Diff Toxin A/B ([id number])  Problem # 4:  HYPOTHYROIDISM (ICD-244.9)  Her updated medication list for this problem includes:    Synthroid 100 Mcg Tabs (Levothyroxine sodium) .Marland KitchenMarland KitchenMarland KitchenMarland Kitchen  Take one tablet by mouth once daily.  Problem # 5:  LOOSE STOOLS (ICD-787.91)  Complete Medication List: 1)  Omeprazole 40 Mg Cpdr (Omeprazole) .Marland Kitchen.. 1 by mouth once daily 2)  Synthroid 100 Mcg Tabs (Levothyroxine sodium) .... Take one tablet by mouth once daily. 3)  Zocor 80 Mg Tabs (Simvastatin) .... 1/2 at bedtime 4)  Vitamin B-6 Cr 200 Mg Cr-tabs (Pyridoxine hcl) .... Take 1 tab once daily 5)  Vitamin B-12 Tabs  (Cyanocobalamin tabs) .... Take 1 tablet by mouth two times a day 6)  Caltrate 600+d Tabs (Calcium carbonate-vitamin d tabs) .... Take 1 tablet by mouth three times a day 7)  Norvasc 10 Mg Tabs (Amlodipine besylate) .... Take 1 tab once daily 8)  Fosamax 70 Mg Tabs (Alendronate sodium) .Marland Kitchen.. 1 by mouth weekly 9)  Osteo Bi-flex  .... Take 1 tablet by mouth two times a day 10)  Stool Softner  .... Take 1 tablet by mouth once a day 11)  Geritol Complete Tabs (Iron-vitamins) .... Take 1 tablet by mouth once a day 12)  Furosemide 40 Mg Tabs (Furosemide) .... Take one tablet every morning. 13)  Vitamin C & E  .... 1 by mouth once daily 14)  Omega-3 Fish Oil 1200 Mg Caps (Omega-3 fatty acids) .... Take 1 tab two times a day 15)  Tylenol 8 Hour 650 Mg Cr-tabs (Acetaminophen) .Marland Kitchen.. 1 by mouth every 8 hours as needed 16)  Antivert 25 Mg Tabs (Meclizine hcl) .Marland Kitchen.. 1 by mouth three times a day as needed 17)  Mucinex 600 Mg Xr12h-tab (Guaifenesin) 18)  Amitriptyline Hcl 10 Mg Tabs (Amitriptyline hcl) .Marland Kitchen.. 1 by mouth at bedtime as needed 19)  Icaps Caps (Multiple vitamins-minerals) .... Take 1 tab in am 1 tab pm 20)  Vitamin D3 1000 Unit Tabs (Cholecalciferol) .... Take 1 tab once daily 21)  Eql Vision Formula Tabs (Multiple vitamins-minerals) .... Take 1 tab once daily 22)  Glucosamine 1500 Complex Caps (Glucosamine-chondroit-vit c-mn) .... Take 1 tab two times a day 23)  B Complex-c Tabs (B complex-c) .... Take 1 tab once daily 24)  Amlodipine Besylate 10 Mg Tabs (Amlodipine besylate) .... Take one tablet by mouth daily 25)  Amitriptyline Hcl 10 Mg Tabs (Amitriptyline hcl) .... As needed 26)  Ultram 50 Mg Tabs (Tramadol hcl) .Marland Kitchen.. 1-2 by mouth every 6 hours as needed 27)  Vision Formula Tabs (Multiple vitamins-minerals) .... Take one tablet by mouth once daily. 28)  Osteo Bi-flex Regular Strength 250-200 Mg Tabs (Glucosamine-chondroitin) .... Take one tablet by mouth once daily. 29)  Detrol La 4 Mg Xr24h-cap  (Tolterodine tartrate) .Marland Kitchen.. 1 by mouth once daily  Other Orders: Admin of Therapeutic Inj  intramuscular or subcutaneous (41324) Vit B12 1000 mcg (M0102)  Laboratory Results   Urine Tests    Routine Urinalysis   Color: yellow Appearance: Clear Glucose: negative   (Normal Range: Negative) Bilirubin: negative   (Normal Range: Negative) Ketone: negative   (Normal Range: Negative) Spec. Gravity: 1.015   (Normal Range: 1.003-1.035) Blood: negative   (Normal Range: Negative) pH: 6.0   (Normal Range: 5.0-8.0) Protein: negative   (Normal Range: Negative) Urobilinogen: 0.2   (Normal Range: 0-1) Nitrite: negative   (Normal Range: Negative) Leukocyte Esterace: negative   (Normal Range: Negative)    Comments: Army Fossa CMA  March 15, 2009 2:32 PM      Medication Administration  Injection # 1:    Medication: Vit B12 1000 mcg    Diagnosis: B12 DEFICIENCY (  ICD-266.2)    Route: IM    Site: R deltoid    Exp Date: 06/2010    Lot #: [id number]    Mfr: [employer] Regent    Patient tolerated injection without complications    Given by: Army Fossa CMA (March 15, 2009 3:41 PM)  Orders Added: 1)  Venipuncture [[address]] 2)  T-Culture, Stool [[id number]] 3)  T-Culture, C-Diff Toxin A/B [[id number]] 4)  Radiology Referral [Radiology] 5)  Admin of Therapeutic Inj  intramuscular or subcutaneous [96372] 6)  Vit B12 1000 mcg [J3420] 7)  Est. Patient Level IV [91478]

## 2010-03-13 NOTE — Progress Notes (Signed)
Summary: Shingles pain/issues  Phone Note Call from Patient Call back at Home Phone [phone]   Caller: Patient Call For: Loreen Freud DO Summary of Call: Patient is not feeling any better and wants to speak with a nurse. Initial call taken by: Barnie Mort,  December 23, 2009 9:08 AM  Follow-up for Phone Call        I spoke with the patient and she c/o pain, nausea, vomiting, and not being able to sleep. Patient notes that this is her 3rd time with shingles and it is the worse she has ever had. Patient notes it hurts the worse at her shoulder about 3 inches from her neck but there is no rash there. Patient notes that she is taking the Valtrex. Please advise. Lucious Groves CMA  December 23, 2009 10:21 AM   Additional Follow-up for Phone Call Additional follow up Details #1::        pt should be seen Additional Follow-up by: Loreen Freud DO,  December 23, 2009 11:35 AM    Additional Follow-up for Phone Call Additional follow up Details #2::    Spk with patient and advised she will need to be evaluated by a Doctor, Pt declined an appt with anyone other than Dr.Lowne. I advised we did not have anything today, she said that was fine because she needed to find a ride here anyway and agreed to be seen tomorrow, scheduled an Eval with Dr.Lowne 12/24/09 at 2:45. Follow-up by: Almeta Monas CMA Duncan Dull),  December 23, 2009 11:57 AM

## 2010-03-13 NOTE — Assessment & Plan Note (Signed)
Summary: b-12/cbs   Nurse Visit   Allergies: 1)  ! Cipro (Ciprofloxacin Hcl)  Medication Administration  Injection # 1:    Medication: Vit B12 1000 mcg    Diagnosis: ANEMIA, B12 DEFICIENCY (ICD-281.1)    Route: IM    Site: R deltoid    Exp Date: 03/2011    Lot #: [id number]    Mfr: [employer]ent    Patient tolerated injection without complications    Given by: Army Fossa CMA (Jun 17, 2009 3:01 PM)  Orders Added: 1)  Admin of Therapeutic Inj  intramuscular or subcutaneous [96372] 2)  Vit B12 1000 mcg [J3420]

## 2010-03-13 NOTE — Assessment & Plan Note (Signed)
Summary: b12 inj//lch   Nurse Visit   Allergies: 1)  ! Cipro (Ciprofloxacin Hcl) Laboratory Results     Medication Administration  Injection # 1:    Medication: Vit B12 1000 mcg    Diagnosis: ANEMIA, B12 DEFICIENCY (ICD-281.1)    Route: IM    Site: L deltoid    Exp Date: 05/2011    Lot #: [id number]    Mfr: [employer]    Patient tolerated injection without complications    Given by: Army Fossa CMA (July 18, 2009 1:52 PM)  Orders Added: 1)  Admin of Therapeutic Inj  intramuscular or subcutaneous [96372] 2)  Vit B12 1000 mcg [J3420]

## 2010-03-13 NOTE — Assessment & Plan Note (Signed)
Summary: INJ AND OV--PH   Vital Signs:  Patient profile:   77 year old female Height:      63 inches Weight:      154 pounds Temp:     98.4 degrees F oral Pulse rate:   82 / minute BP sitting:   120 / 78  (left arm)  Vitals Entered By: Jeremy Johann CMA (August 16, 2009 1:23 PM) CC: swelling and pain in legs and feet, b12 injection Comments REVIEWED MED LIST, PATIENT AGREED DOSE AND INSTRUCTION CORRECT    History of Present Illness: Pt is here for B12 shot but c/o to nurse she was swelling and nurse in cardio told her to be seen here.  Pt states  "I feel like hell".  She lost 1 lbs over night  -- but gained 4 lbs the night before.  She took 2 lasix tabs  yesterday and one this am.  Pt also c/o pain in groin b/l only when she walks.  No constipation, no dysuria.    Current Medications (verified): 1)  Synthroid 88 Mcg Tabs (Levothyroxine Sodium) .... Once Daily 2)  Furosemide 40 Mg  Tabs (Furosemide) .... Take 1 Tablet 5 Days A Week and 2 Tablets 2 X A Week 3)  Nexium 40 Mg Cpdr (Esomeprazole Magnesium) .Marland Kitchen.. 1 By Mouth Two Times A Day 4)  Metoprolol Succinate 25 Mg Xr24h-Tab (Metoprolol Succinate) .... Take One Tablet By Mouth Daily 5)  Calcium Carbonate-Vitamin D 600-400 Mg-Unit  Tabs (Calcium Carbonate-Vitamin D) .... Once Daily 6)  Vitamin B Complex-C   Caps (B Complex-C) .... Once Daily 7)  Amlodipine Besylate 5 Mg Tabs (Amlodipine Besylate) .... Take One Tablet By Mouth Daily 8)  Diltiazem Hcl Er Beads 240 Mg Xr24h-Cap (Diltiazem Hcl Er Beads) .Marland Kitchen.. 1 By Mouth Daily 9)  Osteo Bi-Flex Adv Triple St  Tabs (Misc Natural Products) .... Take 1 Tablet By Mouth Once A Day 10)  Multivitamins   Tabs (Multiple Vitamin) .... W/ Iron --- Once Daily 11)  Melatonin 5 Mg Tabs (Melatonin) .... Take 1 Tablet By Mouth Once A Day 12)  Acidophilus Probiotic Blend  Caps (Probiotic Product) .Marland Kitchen.. 1 By Mouth Daily  Allergies: 1)  ! Cipro (Ciprofloxacin Hcl)  Past History:  Past medical, surgical,  family and social histories (including risk factors) reviewed for relevance to current acute and chronic problems.  Past Medical History: Reviewed history from 08/08/2009 and no changes required.  Osteopenia  URI (ICD-465.9) OSTEOPENIA (ICD-733.90) CHEST XRAY, ABNORMAL (ICD-793.1) CERVICAL CANCER, HX OF (ICD-V10.41) B12 DEFICIENCY (ICD-266.2) BENIGN NEOPLASM OF ADRENAL GLAND (ICD-227.0) HYPOTHYROIDISM (ICD-244.9) URINARY INCONTINENCE (ICD-788.30) OSTEOARTHRITIS (ICD-715.90) GERD (ICD-530.81) COPD (ICD-496) EFFUSION, PLEURAL (ICD-511.9) CHEST PAIN (ICD-786.50)....negative Cardiolite in the past..... ANEMIA, B12 DEFICIENCY (ICD-281.1) FAMILY HISTORY DIABETES 1ST DEGREE RELATIVE (ICD-V18.0) FAMILY HISTORY OF CAD FEMALE 1ST DEGREE RELATIVE <60 (ICD-V16.49) FAMILY HISTORY BREAST CANCER 1ST DEGREE RELATIVE <50 (ICD-V16.3) FAMILY HISTORY OF ANEURYSM AORTIC (ICD-V17.4) BREAST CYST, LEFT (ICD-610.0) FOOT SURGERY, HX OF (ICD-V15.89) HEMORRHAGE, SUBARACHNOID (ICD-430)... therefore no Coumadin...or aspirin.. Dr. Dutch Quint neurosurgery .. aneurysm.. no treatment Ejection fraction 65%  / EF 60%... echo.... November, 2010  /   55-60%...echo..04/12/2009 Aortic insufficiency... mild..echo... 2007 /   mild...echo...04/12/2009 Mitral regurgitation... mild  /  mild... echo... November, 2010 /  mild...echo...04/12/2009 Aortic root dilatation... mild  /  4.5 cm... stable... echo... November, 2010 /  no mention....echo...04/12/2009 DIZZINESS (ICD-780.4) ANEMIA NOS (ICD-285.9) RENAL INSUFFICIENCY (ICD-588.9) PULMONARY EMBOLISM, HX OF (ICD-V12.51).Marland Kitchenand DVT.Marland Kitchen no Coumadin because of subarachnoid hemorrhage HYPERTENSION (ICD-401.9) HYPERLIPIDEMIA (ICD-272.4)  DVT, HX OF (ICD-V12.51) CONGESTIVE HEART FAILURE (ICD-428.0) Atrial fibrillation...noted early 2011..can not take coumadin.. meds adjusted for rate. / Holter monitor Jun 27, 2009.. no rapid heart rate.... mild bradycardia at times.... digoxin stopped Jul 08, 2009    Past Surgical History: Reviewed history from 09/05/2007 and no changes required. Cataract extraction Cholecystectomy PE--- vena cava filter Hip fracture ORIF (07/2007)  Family History: Reviewed history from 08/11/2006 and no changes required. Family History of Aneurysm Aortic Family History Breast cancer 1st degree relative <50 Family History of CAD Female 1st degree relative <60 Family History of Prostate CA 1st degree relative <50 Family History of Stroke M 1st degree relative <50 Family History Diabetes 1st degree relative  Social History: Reviewed history from 08/11/2006 and no changes required. 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 Lungs are clear to auscultation, no crackles or wheezes. Extremities:  left pretibial edema and right pretibial edema.   Neurologic:  alert & oriented X3 and gait normal.   Skin:  Intact without suspicious lesions or rashes Psych:  Cognition and judgment appear intact. Alert and cooperative with normal attention span and concentration. No apparent delusions, illusions, hallucinations   Impression & Recommendations:  Problem # 1:  EDEMA (ICD-782.3)  Pt will take extra lasix today check bmp f/u cardiology Her updated medication list for this problem includes:    Furosemide 40 Mg Tabs (Furosemide) .Marland Kitchen... Take 1 tablet 5 days a week and 2 tablets 2 x a week  Orders: Venipuncture (04540) TLB-BMP (Basic Metabolic Panel-BMET) (80048-METABOL)  Discussed elevation of the legs, use of compression stockings, sodium restiction, and medication use.   Problem # 2:  INGUINAL PAIN, BILATERAL (ICD-789.09)  ? if arthritis in hip pt has appointment with ortho  monday take tylenol for pain---I'm on call this weekend --- she will call if tylenol does not work  Discussed use of medications, application of heat or cold, and exercises.   Problem # 3:  FATIGUE (ICD-780.79) maybe secondary to Lasix B12 is helping some pt states but overall she just feels bad  Complete Medication List: 1)  Synthroid 88 Mcg Tabs (Levothyroxine sodium) .... Once daily 2)  Furosemide 40 Mg Tabs (Furosemide) .... Take 1 tablet 5 days a week and 2 tablets 2 x a week 3)  Nexium 40 Mg Cpdr (Esomeprazole magnesium) .Marland Kitchen.. 1 by mouth two times a day 4)  Metoprolol Succinate 25 Mg Xr24h-tab (Metoprolol succinate) .... Take one tablet by mouth daily 5)  Calcium Carbonate-vitamin D 600-400 Mg-unit Tabs (Calcium carbonate-vitamin d) .... Once daily 6)  Vitamin B Complex-c Caps (B complex-c) .... Once daily 7)  Amlodipine Besylate 5 Mg Tabs (Amlodipine besylate) .... Take one tablet by mouth daily 8)  Diltiazem Hcl Er Beads 240 Mg Xr24h-cap (Diltiazem hcl er beads) .Marland Kitchen.. 1 by mouth daily 9)  Osteo Bi-flex Adv Triple St Tabs (Misc natural products) .... Take 1 tablet by mouth once a day 10)  Multivitamins Tabs (Multiple vitamin) .... W/ iron --- once daily 11)  Melatonin 5 Mg Tabs (Melatonin) .... Take 1 tablet by mouth once a day 12)  Acidophilus Probiotic Blend Caps (Probiotic product) .Marland Kitchen.. 1 by mouth daily  Other Orders: Admin of Therapeutic Inj  intramuscular or subcutaneous (98119) Vit B12 1000 mcg (J3420)  Medication Administration  Injection # 1:    Medication: Vit B12 1000 mcg    Diagnosis: ANEMIA, B12 DEFICIENCY (ICD-281.1)    Route: IM    Site: L deltoid    Exp Date: 04/10/2011    Lot #: [id number]    Mfr: [employer]    Patient tolerated injection without complications    Given by: Jeremy Johann CMA (August 16, 2009 1:24 PM)  Orders Added: 1)  Admin of Therapeutic Inj  intramuscular or subcutaneous [96372] 2)  Vit B12 1000 mcg [J3420] 3)  Venipuncture  [36415] 4)  TLB-BMP (Basic Metabolic Panel-BMET) [80048-METABOL] 5)  Est. Patient Level III [[address]]   Laboratory Results   Urine Tests   Date/Time Reported: August 16, 2009 1:53 PM  Routine Urinalysis   Color: yellow Appearance: Clear Glucose: negative   (Normal Range: Negative) Bilirubin: negative   (Normal Range: Negative) Ketone: negative   (Normal Range: Negative) Spec. Gravity: 1.015   (Normal Range: 1.003-1.035) Blood: negative   (Normal Range: Negative) pH: 5.0   (Normal Range: 5.0-8.0) Protein: negative   (Normal Range: Negative) Urobilinogen: 0.2   (Normal Range: 0-1) Nitrite: negative   (Normal Range: Negative) Leukocyte Esterace: negative   (Normal Range: Negative)

## 2010-03-13 NOTE — Progress Notes (Signed)
  Phone Note Call from Patient   Caller: Patient Summary of Call: pt says nurse was in today, she says I have thrush in my mouth, feels like I have  sores in mouth, and my stools are like tar. OV Scheduled in am .Kandice Hams  March 27, 2009 4:44 PM  Initial call taken by: Kandice Hams,  March 27, 2009 4:44 PM

## 2010-03-13 NOTE — Letter (Signed)
Summary: Primary Care Consult Scheduled Letter  [facility] [place]  [address] [place], [place] [address]   Phone: [phone]  Fax: [phone]      03/21/2009 MRN: [id number]  SIMONE RODENBECK [address] [name], [place]  [address]    Dear Ms. Ferrari,    We have scheduled an appointment for you.  At the recommendation of Dr. Loreen Freud, we have scheduled you a consult with Dr. Aldean Ast with [facility] on 04-10-2009 at 3:15pm.  Their address is [address], [place] [address]. The office phone number is [phone].  If this appointment day and time is not convenient for you, please feel free to call the office of the doctor you are being referred to at the number listed above and reschedule the appointment.    It is important for you to keep your scheduled appointments. We are here to make sure you are given good patient care.   Thank you,    Renee, Patient Care Coordinator [facility]

## 2010-03-13 NOTE — Assessment & Plan Note (Signed)
Summary: per check out/sf  Medications Added MULTIVITAMINS   TABS (MULTIPLE VITAMIN) w/ iron --- once daily      Allergies Added:   Visit Type:  Follow-up Primary Provider:  Loreen Freud DO  CC:  atrial fibrillation.  History of Present Illness: The patient is seen today for followup of atrial fibrillation and fatigue.  I saw her last May 03, 2009.  She continues to have fatigue.  She has exertional shortness of breath.  She is 77 years of age.  However she is insistent that she wants to feel better.  When I saw her last INR the low dose of digoxin for better rate control.  It is now time to check digoxin level and assess her rhythm further. Atrial fibrillation was first noted in early 2011.  She cannot take Coumadin.  Proceeding with cardioversion has not been a consideration.  She had a subarachnoid hemorrhage and therefore cannot receive Coumadin or aspirin.  Rate and volume control will be our best options.  Current Medications (verified): 1)  Synthroid 88 Mcg Tabs (Levothyroxine Sodium) .... Once Daily 2)  Furosemide 40 Mg  Tabs (Furosemide) .... Take One and A Half Tablets Once Daily 3)  Align  Caps (Probiotic Product) 4)  Nexium 40 Mg Cpdr (Esomeprazole Magnesium) .Marland Kitchen.. 1 By Mouth Two Times A Day 5)  Cardizem Cd 240 Mg Xr24h-Cap (Diltiazem Hcl Coated Beads) .... One Tab By Mouth Once Daily 6)  Metoprolol Succinate 25 Mg Xr24h-Tab (Metoprolol Succinate) .... Take One Tablet By Mouth Daily 7)  Calcium Carbonate-Vitamin D 600-400 Mg-Unit  Tabs (Calcium Carbonate-Vitamin D) .... Once Daily 8)  Vitamin B Complex-C   Caps (B Complex-C) .... Once Daily 9)  Amlodipine Besylate 5 Mg Tabs (Amlodipine Besylate) .... Take One Tablet By Mouth Daily 10)  Digoxin 0.125 Mg Tabs (Digoxin) .... Take 1/2 Tab 4 Times A Day Times 3 Days :then 1/2 Tab Everyday 11)  Detrol La 4 Mg Xr24h-Cap (Tolterodine Tartrate) .Marland Kitchen.. 1 By Mouth Daily. 12)  Diltiazem Hcl Er Beads 240 Mg Xr24h-Cap (Diltiazem Hcl Er  Beads) .Marland Kitchen.. 1 By Mouth Daily 13)  Osteo Bi-Flex Adv Triple St  Tabs (Misc Natural Products) 14)  Multivitamins   Tabs (Multiple Vitamin) .... W/ Iron --- Once Daily  Allergies (verified): 1)  ! Cipro (Ciprofloxacin Hcl)  Past History:  Past Medical History: Last updated: 04/15/2009  Osteopenia  URI (ICD-465.9) OSTEOPENIA (ICD-733.90) CHEST XRAY, ABNORMAL (ICD-793.1) CERVICAL CANCER, HX OF (ICD-V10.41) B12 DEFICIENCY (ICD-266.2) BENIGN NEOPLASM OF ADRENAL GLAND (ICD-227.0) HYPOTHYROIDISM (ICD-244.9) URINARY INCONTINENCE (ICD-788.30) OSTEOARTHRITIS (ICD-715.90) GERD (ICD-530.81) COPD (ICD-496) EFFUSION, PLEURAL (ICD-511.9) CHEST PAIN (ICD-786.50)....negative Cardiolite in the past..... ANEMIA, B12 DEFICIENCY (ICD-281.1) FAMILY HISTORY DIABETES 1ST DEGREE RELATIVE (ICD-V18.0) FAMILY HISTORY OF CAD FEMALE 1ST DEGREE RELATIVE <60 (ICD-V16.49) FAMILY HISTORY BREAST CANCER 1ST DEGREE RELATIVE <50 (ICD-V16.3) FAMILY HISTORY OF ANEURYSM AORTIC (ICD-V17.4) BREAST CYST, LEFT (ICD-610.0) FOOT SURGERY, HX OF (ICD-V15.89) HEMORRHAGE, SUBARACHNOID (ICD-430)... therefore no Coumadin...or aspirin.. Dr. Dutch Quint neurosurgery .. aneurysm.. no treatment Ejection fraction 65%  / EF 60%... echo.... November, 2010  /   55-60%...echo..04/12/2009 Aortic insufficiency... mild..echo... 2007 /   mild...echo...04/12/2009 Mitral regurgitation... mild  /  mild... echo... November, 2010 /  mild...echo...04/12/2009 Aortic root dilatation... mild  /  4.5 cm... stable... echo... November, 2010 /  no mention....echo...04/12/2009 DIZZINESS (ICD-780.4) ANEMIA NOS (ICD-285.9) RENAL INSUFFICIENCY (ICD-588.9) PULMONARY EMBOLISM, HX OF (ICD-V12.51).Marland Kitchenand DVT.Marland Kitchen no Coumadin because of subarachnoid hemorrhage HYPERTENSION (ICD-401.9) HYPERLIPIDEMIA (ICD-272.4) DVT, HX OF (ICD-V12.51) CONGESTIVE HEART FAILURE (ICD-428.0) Atrial fibrillation...noted early 2011..can  not take coumadin.. meds adjusted for rate.    Review of  Systems       Patient denies fever, chills, headache, sweats, rash, change in vision, change in hearing, chest pain, cough, nausea or vomiting, urinary symptoms.  She does have some stinging in her feet when she takes her support hose off in the evening.  All of the systems are reviewed and are negative.  Vital Signs:  Patient profile:   77 year old female Height:      63 inches Weight:      159 pounds BMI:     28.27 Pulse rate:   75 / minute BP sitting:   134 / 72  (right arm) Cuff size:   regular  Vitals Entered By: Hardin Negus, RMA (Jun 13, 2009 9:24 AM)  Physical Exam  General:  patient is stable. Head:  head is atraumatic. Eyes:  no xanthelasma. Neck:  no jugular venous distention. Chest Wall:  no chest wall tenderness. Lungs:  lungs are clear.  Respiratory effort is nonlabored. Heart:  cardiac exam reveals an S1-S2.  The rhythm is irregularly irregular. Abdomen:  abdomen is soft. Msk:  no musculoskeletal deformities. Extremities:  trace peripheral edema. Skin:  there is mild ecchymoses on her right lower extremity. Psych:  patient is oriented to person time and place.  She is complaining about how poorly she feels.   Impression & Recommendations:  Problem # 1:  EDEMA (ICD-782.3) At this time I still believe her edema is related to venous insufficiency.  She wears her support hose most of the time.  I am not convinced the total body volume overload.  I have chosen not to adjust her diuretic dose today.  Problem # 2:  ATRIAL FIBRILLATION WITH RAPID VENTRICULAR RESPONSE (ICD-427.31)  Her updated medication list for this problem includes:    Metoprolol Succinate 25 Mg Xr24h-tab (Metoprolol succinate) .Marland Kitchen... Take one tablet by mouth daily    Digoxin 0.125 Mg Tabs (Digoxin) .Marland Kitchen... Take 1/2 tab 4 times a day times 3 days :then 1/2 tab everyday Atrial fibrillation controlled today.  However it is possible that she has marked variation in her rates when she tries to walk.   Digoxin level will be checked today.  She will wear a 24-hour Holter so that we can fully assess her heart rhythm.  She is not candidate for cardioversion as she can not use Coumadin.  Orders: TLB-Digoxin (Lanoxin) (80162-DIG) Holter (Holter)  Problem # 3:  DYSPNEA/SHORTNESS OF BREATH (ICD-786.09)  Her updated medication list for this problem includes:    Furosemide 40 Mg Tabs (Furosemide) .Marland Kitchen... Take one and a half tablets once daily    Cardizem Cd 240 Mg Xr24h-cap (Diltiazem hcl coated beads) ..... One tab by mouth once daily    Metoprolol Succinate 25 Mg Xr24h-tab (Metoprolol succinate) .Marland Kitchen... Take one tablet by mouth daily    Amlodipine Besylate 5 Mg Tabs (Amlodipine besylate) .Marland Kitchen... Take one tablet by mouth daily    Digoxin 0.125 Mg Tabs (Digoxin) .Marland Kitchen... Take 1/2 tab 4 times a day times 3 days :then 1/2 tab everyday    Diltiazem Hcl Er Beads 240 Mg Xr24h-cap (Diltiazem hcl er beads) .Marland Kitchen... 1 by mouth daily At this point her shortness of breath is probably multifactorial.  The first approach will be to see her heart rate response.  Problem # 4:  AORTIC INSUFFICIENCY (ICD-424.1)  Her updated medication list for this problem includes:    Furosemide 40 Mg Tabs (Furosemide) .Marland Kitchen... Take  one and a half tablets once daily    Metoprolol Succinate 25 Mg Xr24h-tab (Metoprolol succinate) .Marland Kitchen... Take one tablet by mouth daily    Digoxin 0.125 Mg Tabs (Digoxin) .Marland Kitchen... Take 1/2 tab 4 times a day times 3 days :then 1/2 tab everyday The patient's valvular heart disease is mild.  She does not need followup echo at this time.  Patient Instructions: 1)  Labs today 2)  Your physician has recommended that you wear a holter monitor.  Holter monitors are medical devices that record the heart's electrical activity. Doctors most often use these monitors to diagnose arrhythmias. Arrhythmias are problems with the speed or rhythm of the heartbeat. The monitor is a small, portable device. You can wear one while you do  your normal daily activities. This is usually used to diagnose what is causing palpitations/syncope (passing out). 3)  Follow up in 3 weeks

## 2010-03-13 NOTE — Progress Notes (Signed)
Summary: Prior Auth APPROVED Lidoderm [employer]  Phone Note Refill Request Call back at [phone] Message from:  Pharmacy on January 06, 2010 1:05 PM  Refills Requested: Medication #1:  LIDODERM 5 % PTCH apply to affected area for up to 12 hours a day.   Dosage confirmed as above?Dosage Confirmed [facility] [address]   Next Appointment Scheduled: seen today Initial call taken by: Harold Barban,  January 06, 2010 1:06 PM  Follow-up for Phone Call        awaiting fax............Marland KitchenFelecia Deloach CMA  January 06, 2010 2:45 PM  prior auth faxed back awaiting response.........Marland KitchenFelecia Deloach CMA  January 06, 2010 3:02 PM   Additional Follow-up for Phone Call Additional follow up Details #1::        Prior auth approved until 01-07-11, pharmacy faxed approval letter scan to chart................Marland KitchenFelecia Deloach CMA  January 07, 2010 9:14 AM

## 2010-03-13 NOTE — Consult Note (Signed)
Summary: [facility]  [facility]   Imported By: Lanelle Bal 10/30/2009 09:10:54  _____________________________________________________________________  External Attachment:    Type:   Image     Comment:   External Document

## 2010-03-13 NOTE — Assessment & Plan Note (Signed)
Summary: b-12 shot--missed nov and dec shots///sph  Nurse Visit   Allergies: 1)  ! Cipro (Ciprofloxacin Hcl) 2)  ! Vicodin  Medication Administration  Injection # 1:    Medication: Vit B12 1000 mcg    Diagnosis: ANEMIA, B12 DEFICIENCY (ICD-281.1)    Route: IM    Site: R deltoid    Exp Date: 04/10/2011    Lot #: [id number]    Mfr: [employer]ent    Patient tolerated injection without complications    Given by: Jeremy Johann CMA (February 13, 2010 2:07 PM)  Orders Added: 1)  Admin of Therapeutic Inj  intramuscular or subcutaneous [96372] 2)  Vit B12 1000 mcg [J3420]

## 2010-03-13 NOTE — Letter (Signed)
Summary: New Patient letter  [facility]  [address] [place], [place] [address]   Phone: [phone]  Fax: [phone]       06/07/2009 MRN: [id number]  [name] [address] [name], [place]  [address]  Dear [name],  Welcome to the Gastroenterology Division at [facility].    You are scheduled to see Dr.  Marina Goodell on 07-10-09 at 9:15a.m. on the 3rd floor at [facility], [address]  We ask that you try to arrive at our office 15 minutes prior to your appointment time to allow for check-in.  We would like you to complete the enclosed self-administered evaluation form prior to your visit and bring it with you on the day of your appointment.  We will review it with you.  Also, please bring a complete list of all your medications or, if you prefer, bring the medication bottles and we will list them.  Please bring your insurance card so that we may make a copy of it.  If your insurance requires a referral to see a specialist, please bring your referral form from your primary care physician.  Co-payments are due at the time of your visit and may be paid by cash, check or credit card.     Your office visit will consist of a consult with your physician (includes a physical exam), any laboratory testing he/she may order, scheduling of any necessary diagnostic testing (e.g. x-ray, ultrasound, CT-scan), and scheduling of a procedure (e.g. Endoscopy, Colonoscopy) if required.  Please allow enough time on your schedule to allow for any/all of these possibilities.    If you cannot keep your appointment, please call [phone] to cancel or reschedule prior to your appointment date.  This allows [place] the opportunity to schedule an appointment for another patient in need of care.  If you do not cancel or reschedule by 5 p.m. the business day prior to your appointment date, you will be charged a $50.00 late cancellation/no-show fee.    Thank you for  choosing  Gastroenterology for your medical needs.  We appreciate the opportunity to care for you.  Please visit [place] at our website  to learn more about our practice.                     Sincerely,                                                             The Gastroenterology Division

## 2010-03-13 NOTE — Progress Notes (Signed)
Summary: bone density  Phone Note Outgoing Call   Call placed by: Jeremy Johann CMA,  March 04, 2009 10:38 AM Summary of Call: left message to call  office.................Marland KitchenFelecia Deloach CMA  March 04, 2009 10:38 AM     + osteopenia----worse than 2 years ago-----con't fosamax and make sure pt taking 1500mg  calcium with vita D3 2000u daily-----recheck 2 years  Follow-up for Phone Call        pt aware, labs mailed, rx sent to pharmacy............Marland KitchenFelecia Deloach CMA  March 04, 2009 11:15 AM     Prescriptions: FOSAMAX 70 MG TABS (ALENDRONATE SODIUM) 1 by mouth weekly  #4 x 11   Entered by:   Jeremy Johann CMA   Authorized by:   Loreen Freud DO   Signed by:   Jeremy Johann CMA on 03/04/2009   Method used:   Faxed to ...       [id number]       [address]       [place], [place]  [address]       Ph: [phone] or [phone]       Fax: [phone]   RxID:   [id number]

## 2010-03-13 NOTE — Assessment & Plan Note (Signed)
Summary: 2wk f/u sl  Medications Added SYNTHROID 88 MCG TABS (LEVOTHYROXINE SODIUM) once daily CALCIUM CARBONATE-VITAMIN D 600-400 MG-UNIT  TABS (CALCIUM CARBONATE-VITAMIN D) once daily VITAMIN B COMPLEX-C   CAPS (B COMPLEX-C) once daily AMLODIPINE BESYLATE 5 MG TABS (AMLODIPINE BESYLATE) Take one tablet by mouth daily COLACE 50 MG CAPS (DOCUSATE SODIUM) as needed DIGOXIN 0.125 MG TABS (DIGOXIN) take 1/2 tab 4 times a day times 3 days :then 1/2 tab everyday      Allergies Added:   Visit Type:  Follow-up Primary Provider:  Loreen Freud DO  CC:  atrial fibrillation.  History of Present Illness: Patient is seen for followup of atrial fibrillation.  I saw her last April 16, 2009.  She notices some swelling in her legs as the day goes on.  This decreases some of night.  She has some nighttime leg cramps.  When I checked her chemistry at the time of her last visit her creatinine was stable at 1.6 and her potassium was normal.  She also has generalized fatigue.  Current Medications (verified): 1)  Synthroid 88 Mcg Tabs (Levothyroxine Sodium) .... Once Daily 2)  Furosemide 40 Mg  Tabs (Furosemide) .... Take One and A Half Tablets Once Daily 3)  Antivert 25 Mg Tabs (Meclizine Hcl) .Marland Kitchen.. 1 By Mouth Three Times A Day As Needed 4)  Align  Caps (Probiotic Product) 5)  Nexium 40 Mg Cpdr (Esomeprazole Magnesium) .Marland Kitchen.. 1 By Mouth Once Daily 6)  Cardizem Cd 240 Mg Xr24h-Cap (Diltiazem Hcl Coated Beads) .... One Tab By Mouth Once Daily 7)  Metoprolol Succinate 25 Mg Xr24h-Tab (Metoprolol Succinate) .... Take One Tablet By Mouth Daily 8)  Calcium Carbonate-Vitamin D 600-400 Mg-Unit  Tabs (Calcium Carbonate-Vitamin D) .... Once Daily 9)  Vitamin B Complex-C   Caps (B Complex-C) .... Once Daily 10)  Amlodipine Besylate 5 Mg Tabs (Amlodipine Besylate) .... Take One Tablet By Mouth Daily 11)  Colace 50 Mg Caps (Docusate Sodium) .... As Needed  Allergies (verified): 1)  ! Cipro (Ciprofloxacin Hcl)  Past  History:  Past Medical History: Last updated: 04/15/2009  Osteopenia  URI (ICD-465.9) OSTEOPENIA (ICD-733.90) CHEST XRAY, ABNORMAL (ICD-793.1) CERVICAL CANCER, HX OF (ICD-V10.41) B12 DEFICIENCY (ICD-266.2) BENIGN NEOPLASM OF ADRENAL GLAND (ICD-227.0) HYPOTHYROIDISM (ICD-244.9) URINARY INCONTINENCE (ICD-788.30) OSTEOARTHRITIS (ICD-715.90) GERD (ICD-530.81) COPD (ICD-496) EFFUSION, PLEURAL (ICD-511.9) CHEST PAIN (ICD-786.50)....negative Cardiolite in the past..... ANEMIA, B12 DEFICIENCY (ICD-281.1) FAMILY HISTORY DIABETES 1ST DEGREE RELATIVE (ICD-V18.0) FAMILY HISTORY OF CAD FEMALE 1ST DEGREE RELATIVE <60 (ICD-V16.49) FAMILY HISTORY BREAST CANCER 1ST DEGREE RELATIVE <50 (ICD-V16.3) FAMILY HISTORY OF ANEURYSM AORTIC (ICD-V17.4) BREAST CYST, LEFT (ICD-610.0) FOOT SURGERY, HX OF (ICD-V15.89) HEMORRHAGE, SUBARACHNOID (ICD-430)... therefore no Coumadin...or aspirin.. Dr. Dutch Quint neurosurgery .. aneurysm.. no treatment Ejection fraction 65%  / EF 60%... echo.... November, 2010  /   55-60%...echo..04/12/2009 Aortic insufficiency... mild..echo... 2007 /   mild...echo...04/12/2009 Mitral regurgitation... mild  /  mild... echo... November, 2010 /  mild...echo...04/12/2009 Aortic root dilatation... mild  /  4.5 cm... stable... echo... November, 2010 /  no mention....echo...04/12/2009 DIZZINESS (ICD-780.4) ANEMIA NOS (ICD-285.9) RENAL INSUFFICIENCY (ICD-588.9) PULMONARY EMBOLISM, HX OF (ICD-V12.51).Marland Kitchenand DVT.Marland Kitchen no Coumadin because of subarachnoid hemorrhage HYPERTENSION (ICD-401.9) HYPERLIPIDEMIA (ICD-272.4) DVT, HX OF (ICD-V12.51) CONGESTIVE HEART FAILURE (ICD-428.0) Atrial fibrillation...noted early 2011..can not take coumadin.. meds adjusted for rate.    Review of Systems       Patient denies fever, chills, headache, sweats, rash, change in vision, change in hearing, chest pain, nausea vomiting, urinary symptoms.  All other systems are reviewed and are negative.  Vital Signs:  Patient  profile:   77 year old female Height:      63 inches Weight:      162 pounds Pulse rate:   88 / minute BP sitting:   140 / 70  (left arm) Cuff size:   regular  Vitals Entered By: Hardin Negus, RMA (May 03, 2009 9:44 AM)  Physical Exam  General:  patient is stable in general. Head:  head is atraumatic. Eyes:  no xanthelasma. Neck:  no jugular venous distention. Chest Wall:  no chest wall tenderness. Lungs:  lungs are clear respiratory effort is nonlabored. Heart:  cardiac exam reveals S1-S2.  The rhythm is irregularly irregular.  Rate is somewhat increased. Abdomen:  abdomen is soft. Msk:  no musculoskeletal deformities. Extremities:  trace peripheral edema. Skin:  no skin rashes. Psych:  patient is oriented to person time and place.  Affect is normal.   Impression & Recommendations:  Problem # 1:  ATRIAL FIBRILLATION WITH RAPID VENTRICULAR RESPONSE (ICD-427.31)  Her updated medication list for this problem includes:    Metoprolol Succinate 25 Mg Xr24h-tab (Metoprolol succinate) .Marland Kitchen... Take one tablet by mouth daily    Digoxin 0.125 Mg Tabs (Digoxin) .Marland Kitchen... Take 1/2 tab 4 times a day times 3 days :then 1/2 tab everyday The patient's atrial fibrillation rate is still increased.  This may be playing a role in her fatigue.  I decided to start her on digoxin.  Problem # 2:  SHORTNESS OF BREATH (ICD-786.05)  Her updated medication list for this problem includes:    Furosemide 40 Mg Tabs (Furosemide) .Marland Kitchen... Take one and a half tablets once daily    Cardizem Cd 240 Mg Xr24h-cap (Diltiazem hcl coated beads) ..... One tab by mouth once daily    Metoprolol Succinate 25 Mg Xr24h-tab (Metoprolol succinate) .Marland Kitchen... Take one tablet by mouth daily    Amlodipine Besylate 5 Mg Tabs (Amlodipine besylate) .Marland Kitchen... Take one tablet by mouth daily    Digoxin 0.125 Mg Tabs (Digoxin) .Marland Kitchen... Take 1/2 tab 4 times a day times 3 days :then 1/2 tab everyday Volume status is stable.  It is possible that with  digoxin we may control her rate better and she may have less shortness of breath.  She does not appear to be volume overloaded today.  Problem # 3:  NONSPECIFIC ABNORM RESULTS KIDNEY FUNCTION STUDY (ICD-794.4) The patient's last chemistries revealed a creatinine 1.6.  This is stable for her.  We will be very careful with the dosing of her digoxin.  Problem # 4:  CONGESTIVE HEART FAILURE (ICD-428.0)  Her updated medication list for this problem includes:    Furosemide 40 Mg Tabs (Furosemide) .Marland Kitchen... Take one and a half tablets once daily    Cardizem Cd 240 Mg Xr24h-cap (Diltiazem hcl coated beads) ..... One tab by mouth once daily    Metoprolol Succinate 25 Mg Xr24h-tab (Metoprolol succinate) .Marland Kitchen... Take one tablet by mouth daily    Amlodipine Besylate 5 Mg Tabs (Amlodipine besylate) .Marland Kitchen... Take one tablet by mouth daily    Digoxin 0.125 Mg Tabs (Digoxin) .Marland Kitchen... Take 1/2 tab 4 times a day times 3 days :then 1/2 tab everyday I believe that her volume status is stable.  I'm hesitant to push her diuretics and the harder.  Problem # 5:  EDEMA (ICD-782.3) I believe that there is any venous insufficiency component to her edema.  I have encouraged her to wear her support hose and she agrees.  Patient Instructions: 1)  Your physician  recommends that you schedule a follow-up appointment in: 6 weeks 2)  Your physician has recommended you make the following change in your medication: please take digoxin 0.065mg  4 times/day for 3days then 1/2 tablet everyday there after Prescriptions: DIGOXIN 0.125 MG TABS (DIGOXIN) take 1/2 tab 4 times a day times 3 days :then 1/2 tab everyday  #90 x 3   Entered by:   Ledon Snare, RN   Authorized by:   Talitha Givens, MD, [facility]   Signed by:   Ledon Snare, RN on 05/03/2009   Method used:   Electronically to        [id number]       [address]       [place], [place]  [address]       Ph: [phone] or [phone]       Fax: [phone]   RxID:    [id number]

## 2010-03-13 NOTE — Progress Notes (Signed)
Summary: refill  Phone Note Refill Request Message from:  Fax from Pharmacy on [facility] [address] fax [phone]  Refills Requested: Medication #1:  OMEPRAZOLE 40 MG  CPDR 1 by mouth once daily Initial call taken by: Barb Merino,  February 27, 2009 8:34 AM    Prescriptions: OMEPRAZOLE 40 MG  CPDR (OMEPRAZOLE) 1 by mouth once daily  #30 Capsule x 5   Entered by:   Army Fossa CMA   Authorized by:   Loreen Freud DO   Signed by:   Army Fossa CMA on 02/27/2009   Method used:   Electronically to        [id number]       [address]       [place], [place]  [address]       Ph: [phone] or [phone]       Fax: [phone]   RxID:   [id number]

## 2010-03-13 NOTE — Letter (Signed)
Summary: [facility]: Immunoassay Fecal Occult Blood (iFOB) Order Form  [facility] [place]  [address] [place], [place] [address]   Phone: [phone]  Fax: [phone]      [facility]: Immunoassay Fecal Occult Blood (iFOB) Order Form   June 06, 2009 MRN: [id number]   CARTIER MAPEL 09-24-1918   Physicican Name:____Yvonne Lowne,DO_____________________  Diagnosis Code:______v76.51____________________      Army Fossa CMA

## 2010-03-13 NOTE — Assessment & Plan Note (Signed)
Summary: 6 MTH FU/NS/KDC   Vital Signs:  Patient profile:   77 year old female Weight:      161.50 pounds Temp:     97.6 degrees F oral Pulse rate:   92 / minute Pulse rhythm:   regular BP sitting:   130 / 84  (left arm) Cuff size:   regular  Vitals Entered By: Army Fossa CMA (April 10, 2009 9:35 AM) CC: Pt here for follow up visit.    History of Present Illness: Pt finally started feeling better yesterday.  Pt still tired and weak.  Pt is drinking fluids.  No more diarrhea but she states the dexilant is not working for heartburn.     Pt  then c/o palpatations with exertion and SOB.   No symptoms at rest.  Allergies: 1)  ! Cipro (Ciprofloxacin Hcl)  Past History:  Past Medical History: Last updated: 12/24/2008  Osteopenia  URI (ICD-465.9) OSTEOPENIA (ICD-733.90) CHEST XRAY, ABNORMAL (ICD-793.1) CERVICAL CANCER, HX OF (ICD-V10.41) B12 DEFICIENCY (ICD-266.2) BENIGN NEOPLASM OF ADRENAL GLAND (ICD-227.0) HYPOTHYROIDISM (ICD-244.9) URINARY INCONTINENCE (ICD-788.30) OSTEOARTHRITIS (ICD-715.90) GERD (ICD-530.81) COPD (ICD-496) EFFUSION, PLEURAL (ICD-511.9) CHEST PAIN (ICD-786.50)....negative Cardiolite in the past..... ANEMIA, B12 DEFICIENCY (ICD-281.1) FAMILY HISTORY DIABETES 1ST DEGREE RELATIVE (ICD-V18.0) FAMILY HISTORY OF CAD FEMALE 1ST DEGREE RELATIVE <60 (ICD-V16.49) FAMILY HISTORY BREAST CANCER 1ST DEGREE RELATIVE <50 (ICD-V16.3) FAMILY HISTORY OF ANEURYSM AORTIC (ICD-V17.4) BREAST CYST, LEFT (ICD-610.0) FOOT SURGERY, HX OF (ICD-V15.89) HEMORRHAGE, SUBARACHNOID (ICD-430)... therefore no Coumadin...or aspirin.. Dr. Dutch Quint neurosurgery .. aneurysm.. no treatment Ejection fraction 65%  / EF 60%... echo.... November, 2010 Aortic insufficiency... mild..echo... 2007 Mitral regurgitation... mild  /  mild... echo... November, 2010 Aortic root dilatation... mild  /  4.5 cm... stable... echo... November, 2010 DIZZINESS (ICD-780.4) ANEMIA NOS (ICD-285.9) RENAL  INSUFFICIENCY (ICD-588.9) PULMONARY EMBOLISM, HX OF (ICD-V12.51).Marland Kitchenand DVT.Marland Kitchen no Coumadin because of subarachnoid hemorrhage HYPERTENSION (ICD-401.9) HYPERLIPIDEMIA (ICD-272.4) DVT, HX OF (ICD-V12.51) CONGESTIVE HEART FAILURE (ICD-428.0)    Past Surgical History: Last updated: 09/05/2007 Cataract extraction Cholecystectomy PE--- vena cava filter Hip fracture ORIF (07/2007)  Family History: Last updated: 08/11/2006 Family History of Aneurysm Aortic Family History Breast cancer 1st degree relative <50 Family History of CAD Female 1st degree relative <60 Family History of Prostate CA 1st degree relative <50 Family History of Stroke M 1st degree relative <50 Family History Diabetes 1st degree relative  Social History: Last updated: 08/11/2006 Retired--  hotels Divorced Never Smoked Alcohol use-yes Drug use-no Regular exercise-yes  Risk Factors: Alcohol Use: <1 (08/11/2006) Caffeine Use: 2 (08/11/2006) Exercise: yes (08/11/2006)  Risk Factors: Smoking Status: never (08/11/2006)  Family History: Reviewed history from 08/11/2006 and no changes required. Family History of Aneurysm Aortic Family History Breast cancer 1st degree relative <50 Family History of CAD Female 1st degree relative <60 Family History of Prostate CA 1st degree relative <50 Family History of Stroke M 1st degree relative <50 Family History Diabetes 1st degree relative  Social History: Reviewed history from 08/11/2006 and no changes required. 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 Lungs are clear to auscultation, no crackles or wheezes. Heart:  irregular Abdomen:  Bowel sounds positive,abdomen soft and non-tender  without masses, organomegaly or hernias noted. Psych:  Oriented X3, normally interactive, and good eye contact.     Impression & Recommendations:  Problem # 1:  ATRIAL FIBRILLATION WITH RAPID VENTRICULAR RESPONSE (ICD-427.31)  discussed with Dr Sylvie Farrier to see cardio today at 2pm  Pt not good candidate for coumadin secondary to subarachnoid hem.  Her updated medication list for this problem includes:    Norvasc 10 Mg Tabs (Amlodipine besylate) .Marland Kitchen... Take 1 tab once daily    Amlodipine Besylate 10 Mg Tabs (Amlodipine besylate) .Marland Kitchen... Take one tablet by mouth daily  Orders: EKG w/ Interpretation (93[address])  Problem # 2:  GERD (ICD-530.81)  The following medications were removed from the medication list:    Omeprazole 40 Mg Cpdr (Omeprazole) .Marland Kitchen... 1 by mouth once daily Her updated medication list for this problem includes:    Nexium 40 Mg Cpdr (Esomeprazole magnesium) .Marland Kitchen... 1 by mouth once daily  Orders: Gastroenterology Referral (GI) EKG w/ Interpretation (93[address])  Problem # 3:  DIVERTICULITIS, ACUTE (ICD-562.11) Assessment: Improved  Labs Reviewed: Hgb: 12.4 (03/15/2009)   Hct: 38.9 (03/15/2009)   WBC: 5.8 (03/15/2009)  Orders: EKG w/ Interpretation (93[address])  Complete Medication List: 1)  Synthroid 100 Mcg Tabs (Levothyroxine sodium) .... Take one tablet by mouth once daily. 2)  Zocor 80 Mg Tabs (Simvastatin) .... 1/2 at bedtime 3)  Vitamin B-6 Cr 200 Mg Cr-tabs (Pyridoxine hcl) .... Take 1 tab once daily 4)  Vitamin B-12 Tabs (Cyanocobalamin tabs) .... Take 1 tablet by mouth two times a day 5)  Caltrate 600+d Tabs (Calcium carbonate-vitamin d tabs) .... Take 1 tablet by mouth three times a day 6)  Norvasc 10 Mg Tabs (Amlodipine besylate) .... Take 1 tab once daily 7)  Fosamax 70 Mg Tabs (Alendronate sodium) .Marland Kitchen.. 1 by mouth weekly 8)  Geritol Complete Tabs (Iron-vitamins) .... Take 1 tablet by mouth once a day 9)  Furosemide 40 Mg Tabs (Furosemide) .... Take one tablet  every morning. 10)  Omega-3 Fish Oil 1200 Mg Caps (Omega-3 fatty acids) .... Take 1 tab two times a day 11)  Tylenol 8 Hour 650 Mg Cr-tabs (Acetaminophen) .Marland Kitchen.. 1 by mouth every 8 hours as needed 12)  Antivert 25 Mg Tabs (Meclizine hcl) .Marland Kitchen.. 1 by mouth three times a day as needed 13)  Mucinex 600 Mg Xr12h-tab (Guaifenesin) 14)  Amitriptyline Hcl 10 Mg Tabs (Amitriptyline hcl) .Marland Kitchen.. 1 by mouth at bedtime as needed 15)  Icaps Caps (Multiple vitamins-minerals) .... Take 1 tab in am 1 tab pm 16)  Vitamin D3 1000 Unit Tabs (Cholecalciferol) .... Take 1 tab once daily 17)  Eql Vision Formula Tabs (Multiple vitamins-minerals) .... Take 1 tab once daily 18)  Glucosamine 1500 Complex Caps (Glucosamine-chondroit-vit c-mn) .... Take 1 tab two times a day 19)  B Complex-c Tabs (B complex-c) .... Take 1 tab once daily 20)  Amlodipine Besylate 10 Mg Tabs (Amlodipine besylate) .... Take one tablet by mouth daily 21)  Amitriptyline Hcl 10 Mg Tabs (Amitriptyline hcl) .... As needed 22)  Ultram 50 Mg Tabs (Tramadol hcl) .Marland Kitchen.. 1-2 by mouth every 6 hours as needed 23)  Vision Formula Tabs (Multiple vitamins-minerals) .... Take one tablet by mouth once daily. 24)  Osteo Bi-flex Regular Strength 250-200 Mg Tabs (Glucosamine-chondroitin) .... Take one tablet by mouth once daily. 25)  Detrol La 4 Mg Xr24h-cap (Tolterodine tartrate) .Marland Kitchen.. 1 by mouth once daily 26)  Nystatin 100000 Unit/ml Susp (Nystatin) .... 5 ml swish and spit qid 27)  Align Caps (Probiotic product) 28)  Nexium 40 Mg Cpdr (Esomeprazole magnesium) .Marland Kitchen.. 1 by mouth once daily Prescriptions: NEXIUM 40 MG CPDR (ESOMEPRAZOLE MAGNESIUM) 1  by mouth once daily  #30 x 2   Entered and Authorized by:   Loreen Freud DO   Signed by:   Loreen Freud DO on 04/10/2009   Method used:   Print then Give to Patient   RxID:   [id number]    EKG  Procedure date:  04/10/2009  Findings:      Afib/ flutter with rapid vent response

## 2010-03-13 NOTE — Progress Notes (Signed)
Summary: REFILL  Phone Note Refill Request Message from:  Fax from Pharmacy on [facility] FAX [phone]  Refills Requested: Medication #1:  AMLODIPINE BESYLATE 10 MG TABS Take one tablet by mouth daily Initial call taken by: Barb Merino,  February 20, 2009 11:37 AM    Prescriptions: NORVASC 10 MG TABS (AMLODIPINE BESYLATE) take 1 tab once daily  #30 Tablet x 1   Entered by:   Army Fossa CMA   Authorized by:   Loreen Freud DO   Signed by:   Army Fossa CMA on 02/20/2009   Method used:   Handwritten   RxID:   [id number]

## 2010-03-13 NOTE — Progress Notes (Signed)
Summary: refill  Phone Note Refill Request Message from:  Fax from Pharmacy on [facility] fax [phone]  Refills Requested: Medication #1:  FUROSEMIDE 40 MG  TABS Take one tablet every morning. simvastatin 80 mg   Initial call taken by: Barb Merino,  February 15, 2009 9:10 AM    Prescriptions: FUROSEMIDE 40 MG  TABS (FUROSEMIDE) Take one tablet every morning.  #30 Tablet x 0   Entered by:   Army Fossa CMA   Authorized by:   Loreen Freud DO   Signed by:   Army Fossa CMA on 02/15/2009   Method used:   Electronically to        [id number] (retail)       [address]       [place], [place]  [address]       Ph: [phone] or [phone]       Fax: [phone]   RxID:   [id number] ZOCOR 80 MG  TABS (SIMVASTATIN) 1/2 at bedtime  #30 x 0   Entered by:   Army Fossa CMA   Authorized by:   Loreen Freud DO   Signed by:   Army Fossa CMA on 02/15/2009   Method used:   Electronically to        [facility] (retail)       [address]       [place], [place]  [address]       Ph: [phone] or [phone]       Fax: [phone]   RxID:   [id number]

## 2010-03-13 NOTE — Progress Notes (Signed)
  Phone Note Call from Patient   Summary of Call: [facility]- [facility] [address]     Prescriptions: FLAGYL 500 MG TABS (METRONIDAZOLE) three times a day for 14 days.  #42 x 0   Entered by:   Army Fossa CMA   Authorized by:   Loreen Freud DO   Signed by:   Army Fossa CMA on 03/19/2009   Method used:   Electronically to        [facility] [address] (retail)       [address]       [place], [place]  [address]       Ph: [phone]       Fax: [phone]   RxID:   [id number]

## 2010-03-13 NOTE — Progress Notes (Signed)
Summary: pain still no better  Phone Note Call from Patient Call back at Home Phone [phone]   Caller: Patient Summary of Call: PT left VM that  the patch is not helping with the pain but the med Rx to help her sleep did help her. Pt is not schedule to apply another patch until 5:30 and wonder if she even apply it since it is no helping her. Pls advise...............Marland KitchenFelecia Deloach CMA  January 07, 2010 3:19 PM   Follow-up for Phone Call        she can apply 2 patches at a time   Is she talking about the gabepentin?  She can take it 2x a day for 3 days then take it three times a day.   Follow-up by: Loreen Freud DO,  January 07, 2010 3:45 PM  Additional Follow-up for Phone Call Additional follow up Details #1::        pt aware of the above, she wrote it down and voiced understanding Additional Follow-up by: Almeta Monas CMA Duncan Dull),  January 07, 2010 3:59 PM

## 2010-03-13 NOTE — Medication Information (Signed)
Summary: Prior Authorization & Approval for [employer]  Prior Authorization & Approval for [employer]   Imported By: Lanelle Bal 01/11/2010 10:30:46  _____________________________________________________________________  External Attachment:    Type:   Image     Comment:   External Document

## 2010-03-13 NOTE — Assessment & Plan Note (Signed)
Summary: B-12/CBS  Nurse Visit   Allergies: 1)  ! Cipro (Ciprofloxacin Hcl)  Medication Administration  Injection # 1:    Medication: Vit B12 1000 mcg    Route: IM    Site: R deltoid    Exp Date: 04/10/2011    Lot #: [id number]    Mfr: American Regent    Patient tolerated injection without complications    Given by: Jeremy Johann CMA (September 13, 2009 10:48 AM)  Orders Added: 1)  Vit B12 1000 mcg [J3420] 2)  Admin of Therapeutic Inj (IM or [place]) [[address]]

## 2010-03-13 NOTE — Assessment & Plan Note (Signed)
Summary: b-12/cbs   Nurse Visit   Allergies: 1)  ! Cipro (Ciprofloxacin Hcl)  Medication Administration  Injection # 1:    Medication: Vit B12 1000 mcg    Diagnosis: ANEMIA, B12 DEFICIENCY (ICD-281.1)    Route: IM    Site: L deltoid    Exp Date: 03/2011    Lot #: [id number]    Mfr: American Regent    Patient tolerated injection without complications    Given by: Army Fossa CMA (Jun 10, 2009 3:17 PM)  Orders Added: 1)  Vit B12 1000 mcg [J3420] 2)  Admin of Therapeutic Inj  intramuscular or subcutaneous [01027]

## 2010-03-13 NOTE — Progress Notes (Signed)
Summary: RETURNING CALL   Phone Note Call from Patient Call back at Home Phone [phone]   Caller: Patient Reason for Call: Talk to Nurse Summary of Call: REQUEST TO SPEAK TO NURSE ABOUT PCP APPT Initial call taken by: Migdalia Dk,  July 18, 2009 2:18 PM  Follow-up for Phone Call        pt saw Dr Laury Axon today and her feet are very swollen she can't stand for anything to touch them, she states they look like elephant legs, her wt is up 3lbs from yest, pt will take extra 60mg  of lasix now and 60mg  two times a day for tom, if not better will c/b Meredith Staggers, RN  July 18, 2009 3:18 PM

## 2010-03-13 NOTE — Assessment & Plan Note (Signed)
Summary: b12/cbs  Nurse Visit   Allergies: 1)  ! Cipro (Ciprofloxacin Hcl)  Medication Administration  Injection # 1:    Medication: Vit B12 1000 mcg    Diagnosis: ANEMIA, B12 DEFICIENCY (ICD-281.1)    Route: IM    Site: R deltoid    Exp Date: 04/10/2011    Lot #: [id number]    Mfr: American Regent    Patient tolerated injection without complications    Given by: Almeta Monas CMA Duncan Dull) (November 08, 2009 10:41 AM)  Orders Added: 1)  Admin of Therapeutic Inj  intramuscular or subcutaneous [96372] 2)  Flu Vaccine 81yrs + MEDICARE PATIENTS [Q2039] 3)  Administration Flu vaccine - MCR [G0008] Flu Vaccine Consent Questions     Do you have a history of severe allergic reactions to this vaccine? no    Any prior history of allergic reactions to egg and/or gelatin? no    Do you have a sensitivity to the preservative Thimersol? no    Do you have a past history of Guillan-Barre Syndrome? no    Do you currently have an acute febrile illness? no    Have you ever had a severe reaction to latex? no    Vaccine information given and explained to patient? yes    Are you currently pregnant? no    Lot Number:[id number]   Exp Date:08/09/2010   Site Given  Left Deltoid IM .lbmedflu

## 2010-03-13 NOTE — Progress Notes (Signed)
Summary: lab order  Phone Note Call from Patient   Summary of Call: patient is scheduled for lab -- b12 - please give lab order  Initial call taken by: Okey Regal [place],  Jun 17, 2009 3:56 PM  Follow-up for Phone Call        Pt had drawn on 06/06/09, she is getting b12 injections. She should start doing monthly injections. Army Fossa CMA  Jun 17, 2009 3:59 PM   Additional Follow-up for Phone Call Additional follow up Details #1::        Yes---recheck lab 3 months Additional Follow-up by: Loreen Freud DO,  Jun 17, 2009 4:55 PM    Additional Follow-up for Phone Call Additional follow up Details #2::    Pt will go to monthly injections, and recheck labs in 3 months. Army Fossa CMA  Jun 17, 2009 4:56 PM   Additional Follow-up for Phone Call Additional follow up Details #3:: Details for Additional Follow-up Action Taken: Patient is aware of the lab appointment being cancelled and is coming in on 6.9.11 for her first monthly b12 injection Additional Follow-up by: Harold Barban,  Jun 18, 2009 9:17 AM

## 2010-03-13 NOTE — Assessment & Plan Note (Signed)
Summary: f73m/per check out/lg   Visit Type:  Follow-up Referring Provider:  Rafael Bihari Primary Provider:  Loreen Freud DO  CC:  atrial fibrillation.  History of Present Illness: The patient is seen today for follow up of atrial fibrillation and volume overload.  She is actually doing well.  Unfortunately she's had a second episode of shingles.  He was treated early and hopefully this will resolve.  She's not having any significant palpitations and she's not having any significant shortness of breath.  Current Medications (verified): 1)  Synthroid 88 Mcg Tabs (Levothyroxine Sodium) .... Once Daily 2)  Furosemide 40 Mg  Tabs (Furosemide) .... Take One Daily 3)  Nexium 40 Mg Cpdr (Esomeprazole Magnesium) .Marland Kitchen.. 1 By Mouth Two Times A Day 4)  Metoprolol Succinate 25 Mg Xr24h-Tab (Metoprolol Succinate) .... Take One Tablet By Mouth Daily 5)  Calcium Carbonate-Vitamin D 600-400 Mg-Unit  Tabs (Calcium Carbonate-Vitamin D) .... Once Daily 6)  Vitamin B Complex-C   Caps (B Complex-C) .... Once Daily 7)  Amlodipine Besylate 5 Mg Tabs (Amlodipine Besylate) .... Take One Tablet By Mouth Daily 8)  Diltiazem Hcl Er Beads 240 Mg Xr24h-Cap (Diltiazem Hcl Er Beads) .Marland Kitchen.. 1 By Mouth Daily 9)  Osteo Bi-Flex Adv Triple St  Tabs (Misc Natural Products) .... Take 1 Tablet By Mouth Once A Day 10)  Multivitamins   Tabs (Multiple Vitamin) .... W/ Iron --- Once Daily 11)  Melatonin 5 Mg Tabs (Melatonin) .... Take 1 Tablet By Mouth Once A Day 12)  Acidophilus Probiotic Blend  Caps (Probiotic Product) .Marland Kitchen.. 1 By Mouth Daily 13)  Acetaminophen 325 Mg  Tabs (Acetaminophen) .... As Needed 14)  Digoxin 0.125 Mg Tabs (Digoxin) .... Take A 1/2  Tablet By Mouth Daily 4 Days A Week 15)  Valtrex 1 Gm Tabs (Valacyclovir Hcl) .Marland Kitchen.. 1 By Mouth Three Times A Day For 10 Days  Allergies: 1)  ! Cipro (Ciprofloxacin Hcl) 2)  ! Vicodin  Past History:  Past Medical History:  Osteopenia  URI (ICD-465.9) OSTEOPENIA  (ICD-733.90) CHEST XRAY, ABNORMAL (ICD-793.1) CERVICAL CANCER, HX OF (ICD-V10.41) B12 DEFICIENCY (ICD-266.2) BENIGN NEOPLASM OF ADRENAL GLAND (ICD-227.0) HYPOTHYROIDISM (ICD-244.9) URINARY INCONTINENCE (ICD-788.30) OSTEOARTHRITIS (ICD-715.90) GERD (ICD-530.81) COPD (ICD-496) EFFUSION, PLEURAL (ICD-511.9) CHEST PAIN (ICD-786.50)....negative Cardiolite in the past..... ANEMIA, B12 DEFICIENCY (ICD-281.1) FAMILY HISTORY DIABETES 1ST DEGREE RELATIVE (ICD-V18.0) FAMILY HISTORY OF CAD FEMALE 1ST DEGREE RELATIVE <60 (ICD-V16.49) FAMILY HISTORY BREAST CANCER 1ST DEGREE RELATIVE <50 (ICD-V16.3) FAMILY HISTORY OF ANEURYSM AORTIC (ICD-V17.4) BREAST CYST, LEFT (ICD-610.0) FOOT SURGERY, HX OF (ICD-V15.89) HEMORRHAGE, SUBARACHNOID (ICD-430)... therefore no Coumadin...or aspirin.. Dr. Dutch Quint neurosurgery .. aneurysm.. no treatment EF        65%  / EF 60%... echo.... November, 2010  /   55-60%...echo..04/12/2009 Aortic insufficiency... mild..echo... 2007 /   mild...echo...04/12/2009 Mitral regurgitation... mild  /  mild... echo... November, 2010 /  mild...echo...04/12/2009 Aortic root dilatation... mild  /  4.5 cm... stable... echo... November, 2010 /  no mention....echo...04/12/2009 DIZZINESS (ICD-780.4) ANEMIA NOS (ICD-285.9) RENAL INSUFFICIENCY (ICD-588.9) PULMONARY EMBOLISM, HX OF (ICD-V12.51).Marland Kitchenand DVT.Marland Kitchen no Coumadin because of subarachnoid hemorrhage HYPERTENSION (ICD-401.9) HYPERLIPIDEMIA (ICD-272.4) DVT, HX OF (ICD-V12.51) CONGESTIVE HEART FAILURE (ICD-428.0) Atrial fibrillation...noted early 2011..can not take coumadin.. meds adjusted for rate. / Holter monitor Jun 27, 2009.. no rapid heart rate.... mild bradycardia at times.... digoxin stopped Jul 08, 2009  /   September, 2011.... shortness of breath with palpitations when walking... decision to resume low-dose digoxin Shingles  repeated episode.. November, 2011    Review of  Systems       Patient denies fever, chills, headache, sweats, change in  vision, change in hearing, cough, nausea vomiting, urinary symptoms.  All of the systems are reviewed and are negative.  Vital Signs:  Patient profile:   77 year old female Height:      63 inches Weight:      147 pounds Pulse rate:   80 / minute Pulse rhythm:   irregular BP sitting:   120 / 70  (right arm)  Vitals Entered By: Jacquelin Hawking, CMA (January 16, 2010 10:09 AM)  Physical Exam  General:  patient looks good. Eyes:  no xanthelasma. Neck:  no jugular venous stenting. Lungs:  lungs are clear good respiratory effort is not labored. Heart:  the rhythm is irregularly irregular.  Rate is controlled.  There is a soft systolic murmur. Abdomen:  abdomen soft. Extremities:  no significant peripheral edema today. Psych:  patient is oriented to person time and place.  Affect is normal.  Prothrombin areas more positive than usual despite her shingles.  This shows that her cardiovascular status appears to be stable.   Impression & Recommendations:  Problem # 1:  SHINGLES (ICD-053.9) This is being treated.  Problem # 2:  EDEMA (ICD-782.3) volume status is stable.  No change in therapy.  Problem # 3:  ATRIAL FIBRILLATION WITH RAPID VENTRICULAR RESPONSE (ICD-427.31)  Her updated medication list for this problem includes:    Metoprolol Succinate 25 Mg Xr24h-tab (Metoprolol succinate) .Marland Kitchen... Take one tablet by mouth daily    Digoxin 0.125 Mg Tabs (Digoxin) .Marland Kitchen... Take a 1/2  tablet by mouth daily 4 days a week Rate is controlled.  No change in therapy.  Problem # 4:  SHORTNESS OF BREATH (ICD-786.05)  Her updated medication list for this problem includes:    Furosemide 40 Mg Tabs (Furosemide) .Marland Kitchen... Take one daily    Metoprolol Succinate 25 Mg Xr24h-tab (Metoprolol succinate) .Marland Kitchen... Take one tablet by mouth daily    Amlodipine Besylate 5 Mg Tabs (Amlodipine besylate) .Marland Kitchen... Take one tablet by mouth daily    Diltiazem Hcl Er Beads 240 Mg Xr24h-cap (Diltiazem hcl er beads) .Marland Kitchen... 1 by  mouth daily    Digoxin 0.125 Mg Tabs (Digoxin) .Marland Kitchen... Take a 1/2  tablet by mouth daily 4 days a week No significant shortness of breath today.  She is doing well.  Six-month followup.  Patient Instructions: 1)  Your physician recommends that you schedule a follow-up appointment in: 6 months. 2)  Your physician recommends that you continue on your current medications as directed. Please refer to the Current Medication list given to you today.

## 2010-03-13 NOTE — Miscellaneous (Signed)
  Clinical Lists Changes  Observations: Added new observation of PAST MED HX:  Osteopenia  URI (ICD-465.9) OSTEOPENIA (ICD-733.90) CHEST XRAY, ABNORMAL (ICD-793.1) CERVICAL CANCER, HX OF (ICD-V10.41) B12 DEFICIENCY (ICD-266.2) BENIGN NEOPLASM OF ADRENAL GLAND (ICD-227.0) HYPOTHYROIDISM (ICD-244.9) URINARY INCONTINENCE (ICD-788.30) OSTEOARTHRITIS (ICD-715.90) GERD (ICD-530.81) COPD (ICD-496) EFFUSION, PLEURAL (ICD-511.9) CHEST PAIN (ICD-786.50)....negative Cardiolite in the past..... ANEMIA, B12 DEFICIENCY (ICD-281.1) FAMILY HISTORY DIABETES 1ST DEGREE RELATIVE (ICD-V18.0) FAMILY HISTORY OF CAD FEMALE 1ST DEGREE RELATIVE <60 (ICD-V16.49) FAMILY HISTORY BREAST CANCER 1ST DEGREE RELATIVE <50 (ICD-V16.3) FAMILY HISTORY OF ANEURYSM AORTIC (ICD-V17.4) BREAST CYST, LEFT (ICD-610.0) FOOT SURGERY, HX OF (ICD-V15.89) HEMORRHAGE, SUBARACHNOID (ICD-430)... therefore no Coumadin...or aspirin.. Dr. Dutch Quint neurosurgery .. aneurysm.. no treatment Ejection fraction 65%  / EF 60%... echo.... November, 2010  /   55-60%...echo..04/12/2009 Aortic insufficiency... mild..echo... 2007 /   mild...echo...04/12/2009 Mitral regurgitation... mild  /  mild... echo... November, 2010 /  mild...echo...04/12/2009 Aortic root dilatation... mild  /  4.5 cm... stable... echo... November, 2010 /  no mention....echo...04/12/2009 DIZZINESS (ICD-780.4) ANEMIA NOS (ICD-285.9) RENAL INSUFFICIENCY (ICD-588.9) PULMONARY EMBOLISM, HX OF (ICD-V12.51).Marland Kitchenand DVT.Marland Kitchen no Coumadin because of subarachnoid hemorrhage HYPERTENSION (ICD-401.9) HYPERLIPIDEMIA (ICD-272.4) DVT, HX OF (ICD-V12.51) CONGESTIVE HEART FAILURE (ICD-428.0) Atrial fibrillation...noted early 2011..can not take coumadin.. meds adjusted for rate.   (04/15/2009 18:11) Added new observation of PRIMARY MD: Loreen Freud DO (04/15/2009 18:11)       Past History:  Past Medical History:  Osteopenia  URI (ICD-465.9) OSTEOPENIA (ICD-733.90) CHEST XRAY, ABNORMAL  (ICD-793.1) CERVICAL CANCER, HX OF (ICD-V10.41) B12 DEFICIENCY (ICD-266.2) BENIGN NEOPLASM OF ADRENAL GLAND (ICD-227.0) HYPOTHYROIDISM (ICD-244.9) URINARY INCONTINENCE (ICD-788.30) OSTEOARTHRITIS (ICD-715.90) GERD (ICD-530.81) COPD (ICD-496) EFFUSION, PLEURAL (ICD-511.9) CHEST PAIN (ICD-786.50)....negative Cardiolite in the past..... ANEMIA, B12 DEFICIENCY (ICD-281.1) FAMILY HISTORY DIABETES 1ST DEGREE RELATIVE (ICD-V18.0) FAMILY HISTORY OF CAD FEMALE 1ST DEGREE RELATIVE <60 (ICD-V16.49) FAMILY HISTORY BREAST CANCER 1ST DEGREE RELATIVE <50 (ICD-V16.3) FAMILY HISTORY OF ANEURYSM AORTIC (ICD-V17.4) BREAST CYST, LEFT (ICD-610.0) FOOT SURGERY, HX OF (ICD-V15.89) HEMORRHAGE, SUBARACHNOID (ICD-430)... therefore no Coumadin...or aspirin.. Dr. Dutch Quint neurosurgery .. aneurysm.. no treatment Ejection fraction 65%  / EF 60%... echo.... November, 2010  /   55-60%...echo..04/12/2009 Aortic insufficiency... mild..echo... 2007 /   mild...echo...04/12/2009 Mitral regurgitation... mild  /  mild... echo... November, 2010 /  mild...echo...04/12/2009 Aortic root dilatation... mild  /  4.5 cm... stable... echo... November, 2010 /  no mention....echo...04/12/2009 DIZZINESS (ICD-780.4) ANEMIA NOS (ICD-285.9) RENAL INSUFFICIENCY (ICD-588.9) PULMONARY EMBOLISM, HX OF (ICD-V12.51).Marland Kitchenand DVT.Marland Kitchen no Coumadin because of subarachnoid hemorrhage HYPERTENSION (ICD-401.9) HYPERLIPIDEMIA (ICD-272.4) DVT, HX OF (ICD-V12.51) CONGESTIVE HEART FAILURE (ICD-428.0) Atrial fibrillation...noted early 2011..can not take coumadin.. meds adjusted for rate.

## 2010-03-13 NOTE — Assessment & Plan Note (Signed)
Summary: 1 month rov  Medications Added FUROSEMIDE 40 MG  TABS (FUROSEMIDE) take 1 tablet 5 days a week and 2 tablets 2 x a week OSTEO BI-FLEX ADV TRIPLE ST  TABS (MISC NATURAL PRODUCTS) Take 1 tablet by mouth once a day MELATONIN 5 MG TABS (MELATONIN) Take 1 tablet by mouth once a day        Visit Type:  1 month follow up Referring Provider:  Kimbrough Primary Provider:  Loreen Freud DO  CC:  atrial fibrillation.  History of Present Illness: The patient is seen for follow up of atrial fibrillation.  She is actually doing well today.  I saw her last Jul 09, 2009.  At that time I put her digoxin on hold to see if some increase in heart rate might help her feel better.  I also encouraged her to cut back on her extra fluid.  Her edema is better.  Her weight is down 2 pounds.  She is feeling better.  She has renal cysts.  She has seen Dr.Kimbrough.  He would like for her to keep her fluid intake if possible.  She also has been seen by orthopedics and is at rehabilitation for her knees her arthritis.  Support hose have been recommended and I am in favor of this.  Her shortness of breath is somewhat improved.  Current Medications (verified): 1)  Synthroid 88 Mcg Tabs (Levothyroxine Sodium) .... Once Daily 2)  Furosemide 40 Mg  Tabs (Furosemide) .... Take 1 Tablet 5 Days A Week and 2 Tablets 2 X A Week 3)  Nexium 40 Mg Cpdr (Esomeprazole Magnesium) .Marland Kitchen.. 1 By Mouth Two Times A Day 4)  Metoprolol Succinate 25 Mg Xr24h-Tab (Metoprolol Succinate) .... Take One Tablet By Mouth Daily 5)  Calcium Carbonate-Vitamin D 600-400 Mg-Unit  Tabs (Calcium Carbonate-Vitamin D) .... Once Daily 6)  Vitamin B Complex-C   Caps (B Complex-C) .... Once Daily 7)  Amlodipine Besylate 5 Mg Tabs (Amlodipine Besylate) .... Take One Tablet By Mouth Daily 8)  Digoxin 0.125 Mg Tabs (Digoxin) .... 1/2 By Mouth Daily--On Hold 9)  Diltiazem Hcl Er Beads 240 Mg Xr24h-Cap (Diltiazem Hcl Er Beads) .Marland Kitchen.. 1 By Mouth Daily 10)  Osteo  Bi-Flex Adv Triple St  Tabs (Misc Natural Products) .... Take 1 Tablet By Mouth Once A Day 11)  Multivitamins   Tabs (Multiple Vitamin) .... W/ Iron --- Once Daily 12)  Melatonin 5 Mg Tabs (Melatonin) .... Take 1 Tablet By Mouth Once A Day 13)  Acidophilus Probiotic Blend  Caps (Probiotic Product) .Marland Kitchen.. 1 By Mouth Daily  Allergies: 1)  ! Cipro (Ciprofloxacin Hcl)  Past History:  Past Medical History:  Osteopenia  URI (ICD-465.9) OSTEOPENIA (ICD-733.90) CHEST XRAY, ABNORMAL (ICD-793.1) CERVICAL CANCER, HX OF (ICD-V10.41) B12 DEFICIENCY (ICD-266.2) BENIGN NEOPLASM OF ADRENAL GLAND (ICD-227.0) HYPOTHYROIDISM (ICD-244.9) URINARY INCONTINENCE (ICD-788.30) OSTEOARTHRITIS (ICD-715.90) GERD (ICD-530.81) COPD (ICD-496) EFFUSION, PLEURAL (ICD-511.9) CHEST PAIN (ICD-786.50)....negative Cardiolite in the past..... ANEMIA, B12 DEFICIENCY (ICD-281.1) FAMILY HISTORY DIABETES 1ST DEGREE RELATIVE (ICD-V18.0) FAMILY HISTORY OF CAD FEMALE 1ST DEGREE RELATIVE <60 (ICD-V16.49) FAMILY HISTORY BREAST CANCER 1ST DEGREE RELATIVE <50 (ICD-V16.3) FAMILY HISTORY OF ANEURYSM AORTIC (ICD-V17.4) BREAST CYST, LEFT (ICD-610.0) FOOT SURGERY, HX OF (ICD-V15.89) HEMORRHAGE, SUBARACHNOID (ICD-430)... therefore no Coumadin...or aspirin.. Dr. Dutch Quint neurosurgery .. aneurysm.. no treatment Ejection fraction 65%  / EF 60%... echo.... November, 2010  /   55-60%...echo..04/12/2009 Aortic insufficiency... mild..echo... 2007 /   mild...echo...04/12/2009 Mitral regurgitation... mild  /  mild... echo... November, 2010 /  mild...echo...04/12/2009 Aortic root dilatation.Marland KitchenMarland Kitchen  mild  /  4.5 cm... stable... echo... November, 2010 /  no mention....echo...04/12/2009 DIZZINESS (ICD-780.4) ANEMIA NOS (ICD-285.9) RENAL INSUFFICIENCY (ICD-588.9) PULMONARY EMBOLISM, HX OF (ICD-V12.51).Marland Kitchenand DVT.Marland Kitchen no Coumadin because of subarachnoid hemorrhage HYPERTENSION (ICD-401.9) HYPERLIPIDEMIA (ICD-272.4) DVT, HX OF (ICD-V12.51) CONGESTIVE HEART FAILURE  (ICD-428.0) Atrial fibrillation...noted early 2011..can not take coumadin.. meds adjusted for rate. / Holter monitor Jun 27, 2009.. no rapid heart rate.... mild bradycardia at times.... digoxin stopped Jul 08, 2009    Review of Systems       Patient denies fever, chills, headache, sweats, rash, change in vision, change in hearing, chest pain, cough, nausea vomiting, urinary symptoms.  All other systems are reviewed.  Vital Signs:  Patient profile:   77 year old female Height:      63 inches Weight:      153 pounds BMI:     27.20 Pulse rate:   68 / minute Pulse rhythm:   irregular Resp:     18 per minute BP sitting:   124 / 70  (left arm) Cuff size:   regular  Vitals Entered By: Vikki Ports (August 08, 2009 10:03 AM)  Physical Exam  General:  patient looks good today. Eyes:  no xanthelasma. Neck:  no jugular venous distention. Lungs:  lungs are clear.  Respiratory effort is unlabored. Heart:  cardiac exam reveals S1-S2.  No clicks or significant murmurs.  The rhythm is irregularly irregular. Abdomen:  abdomen is soft. Extremities:  no significant edema today. Psych:  patient is oriented to person time and place.  Affect is normal.    Impression & Recommendations:  Problem # 1:  EDEMA (ICD-782.3) Patient's edema is better this week.  This may be in part due to decreased fluid intake.  For urologic purposes she needs a little more intake.  Support hose will help.  I am okay with her returning to slight increase in fluid.  We will write a prescription for her support hose as recommended by rehabilitation  Problem # 2:  ATRIAL FIBRILLATION WITH RAPID VENTRICULAR RESPONSE (ICD-427.31)  The following medications were removed from the medication list:    Digoxin 0.125 Mg Tabs (Digoxin) .Marland Kitchen... 1/2 by mouth daily--on hold Her updated medication list for this problem includes:    Metoprolol Succinate 25 Mg Xr24h-tab (Metoprolol succinate) .Marland Kitchen... Take one tablet by mouth daily Digoxin  has been on hold.  I do think she has had some good increase in heart rate and digoxin will be stopped completely.  Orders: EKG w/ Interpretation (93000)  Problem # 3:  RENAL CYST (ICD-593.2) This is being followed by Dr.Kimbrough.  He would like for [place] to allow more fluid if possible  Problem # 4:  DYSPNEA/SHORTNESS OF BREATH (ICD-786.09)  The following medications were removed from the medication list:    Digoxin 0.125 Mg Tabs (Digoxin) .Marland Kitchen... 1/2 by mouth daily--on hold Her updated medication list for this problem includes:    Furosemide 40 Mg Tabs (Furosemide) .Marland Kitchen... Take 1 tablet 5 days a week and 2 tablets 2 x a week    Metoprolol Succinate 25 Mg Xr24h-tab (Metoprolol succinate) .Marland Kitchen... Take one tablet by mouth daily    Amlodipine Besylate 5 Mg Tabs (Amlodipine besylate) .Marland Kitchen... Take one tablet by mouth daily    Diltiazem Hcl Er Beads 240 Mg Xr24h-cap (Diltiazem hcl er beads) .Marland Kitchen... 1 by mouth daily For feeding is a little better today.  This may be from decrease in fluid intake.  I am hopeful that she will continue to feel  well with a slight increase in fluid.  Patient Instructions: 1)  Stop Digoxin 2)  We have given you a prescription for support hose 3)  Follow up in 3 months

## 2010-03-13 NOTE — Assessment & Plan Note (Signed)
Summary: [email or web address].CBS   Vital Signs:  Patient profile:   77 year old female Weight:      149.8 pounds Temp:     98.0 degrees F oral BP sitting:   116 / 64  (right arm) Cuff size:   regular  Vitals Entered By: Almeta Monas CMA Duncan Dull) (January 06, 2010 11:20 AM) CC: still having pain from shingles--also c/o gabapentin being too strong   History of Present Illness: Pt here with friend c/o shingles pain.  Pt took ultram and neurontin together and it make her "crazy".  She did not try neurontin alone.    Current Medications (verified): 1)  Synthroid 88 Mcg Tabs (Levothyroxine Sodium) .... Once Daily 2)  Furosemide 40 Mg  Tabs (Furosemide) .... Take 1 & 1/2 Tab Daily 3)  Nexium 40 Mg Cpdr (Esomeprazole Magnesium) .Marland Kitchen.. 1 By Mouth Two Times A Day 4)  Metoprolol Succinate 25 Mg Xr24h-Tab (Metoprolol Succinate) .... Take One Tablet By Mouth Daily 5)  Calcium Carbonate-Vitamin D 600-400 Mg-Unit  Tabs (Calcium Carbonate-Vitamin D) .... Once Daily 6)  Vitamin B Complex-C   Caps (B Complex-C) .... Once Daily 7)  Amlodipine Besylate 5 Mg Tabs (Amlodipine Besylate) .... Take One Tablet By Mouth Daily 8)  Diltiazem Hcl Er Beads 240 Mg Xr24h-Cap (Diltiazem Hcl Er Beads) .Marland Kitchen.. 1 By Mouth Daily 9)  Osteo Bi-Flex Adv Triple St  Tabs (Misc Natural Products) .... Take 1 Tablet By Mouth Once A Day 10)  Multivitamins   Tabs (Multiple Vitamin) .... W/ Iron --- Once Daily 11)  Melatonin 5 Mg Tabs (Melatonin) .... Take 1 Tablet By Mouth Once A Day 12)  Acidophilus Probiotic Blend  Caps (Probiotic Product) .Marland Kitchen.. 1 By Mouth Daily 13)  Acetaminophen 325 Mg  Tabs (Acetaminophen) .... As Needed 14)  Digoxin 0.125 Mg Tabs (Digoxin) .... Take A 1/2  Tablet By Mouth Daily 4 Days A Week 15)  Valtrex 1 Gm Tabs (Valacyclovir Hcl) .Marland Kitchen.. 1 By Mouth Three Times A Day For 10 Days 16)  Ultracet 37.5-325 Mg Tabs (Tramadol-Acetaminophen) .Marland Kitchen.. 1-2 By Mouth At Bedtime As Needed 17)  Vicodin Es 7.5-750 Mg Tabs  (Hydrocodone-Acetaminophen) .Marland Kitchen.. 1 By Mouth Every 6 Hours As Needed 18)  Nystop 100000 Unit/gm Powd (Nystatin) .... Apply Three Times A Day For Up To 2 Weeks 19)  Ultram 50 Mg Tabs (Tramadol Hcl) .... Take 1 By Mouth Every 6 Hours As Needed For Pain 20)  Neurontin 300 Mg Caps (Gabapentin) .... Take 1 By Mouth At Bedtime For 2-3 Days Then Can Increase To Two Times A Day For 2-3 Days 21)  Neurontin 300 Mg Caps (Gabapentin) .... Then Take 1 Take Three Times A Day As Needed 22)  Lidoderm 5 % Ptch (Lidocaine) .... Apply To Affected Area For Up To 12 Hours A Day  Allergies (verified): 1)  ! Cipro (Ciprofloxacin Hcl) 2)  ! Vicodin  Past History:  Past Medical History: Last updated: 10/15/2009  Osteopenia  URI (ICD-465.9) OSTEOPENIA (ICD-733.90) CHEST XRAY, ABNORMAL (ICD-793.1) CERVICAL CANCER, HX OF (ICD-V10.41) B12 DEFICIENCY (ICD-266.2) BENIGN NEOPLASM OF ADRENAL GLAND (ICD-227.0) HYPOTHYROIDISM (ICD-244.9) URINARY INCONTINENCE (ICD-788.30) OSTEOARTHRITIS (ICD-715.90) GERD (ICD-530.81) COPD (ICD-496) EFFUSION, PLEURAL (ICD-511.9) CHEST PAIN (ICD-786.50)....negative Cardiolite in the past..... ANEMIA, B12 DEFICIENCY (ICD-281.1) FAMILY HISTORY DIABETES 1ST DEGREE RELATIVE (ICD-V18.0) FAMILY HISTORY OF CAD FEMALE 1ST DEGREE RELATIVE <60 (ICD-V16.49) FAMILY HISTORY BREAST CANCER 1ST DEGREE RELATIVE <50 (ICD-V16.3) FAMILY HISTORY OF ANEURYSM AORTIC (ICD-V17.4) BREAST CYST, LEFT (ICD-610.0) FOOT SURGERY, HX OF (ICD-V15.89) HEMORRHAGE, SUBARACHNOID (ICD-430)... therefore no Coumadin...or  aspirin.. Dr. Dutch Quint neurosurgery .. aneurysm.. no treatment EF        65%  / EF 60%... echo.... November, 2010  /   55-60%...echo..04/12/2009 Aortic insufficiency... mild..echo... 2007 /   mild...echo...04/12/2009 Mitral regurgitation... mild  /  mild... echo... November, 2010 /  mild...echo...04/12/2009 Aortic root dilatation... mild  /  4.5 cm... stable... echo... November, 2010 /  no  mention....echo...04/12/2009 DIZZINESS (ICD-780.4) ANEMIA NOS (ICD-285.9) RENAL INSUFFICIENCY (ICD-588.9) PULMONARY EMBOLISM, HX OF (ICD-V12.51).Marland Kitchenand DVT.Marland Kitchen no Coumadin because of subarachnoid hemorrhage HYPERTENSION (ICD-401.9) HYPERLIPIDEMIA (ICD-272.4) DVT, HX OF (ICD-V12.51) CONGESTIVE HEART FAILURE (ICD-428.0) Atrial fibrillation...noted early 2011..can not take coumadin.. meds adjusted for rate. / Holter monitor Jun 27, 2009.. no rapid heart rate.... mild bradycardia at times.... digoxin stopped Jul 08, 2009  /   September, 2011.... shortness of breath with palpitations when walking... 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 Family History of Aneurysm Aortic Family History Breast cancer 1st degree relative <50 Family History of CAD Female 1st degree relative <60 Family History of Prostate CA 1st degree relative <50 Family History of Stroke M 1st degree relative <50 Family History Diabetes 1st degree relative  Social History: Reviewed history from 08/11/2006 and no changes required. 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     Impression & Recommendations:  Problem # 1:  SHINGLES (ICD-053.9) neurontin lidoderm patch call or rto prn  Complete Medication List: 1)  Synthroid 88 Mcg Tabs (Levothyroxine sodium) .... Once daily 2)  Furosemide 40 Mg Tabs (Furosemide) .... Take 1 & 1/2 tab daily 3)  Nexium 40 Mg Cpdr (Esomeprazole magnesium) .Marland Kitchen.. 1 by mouth two times a day 4)  Metoprolol Succinate 25 Mg Xr24h-tab (Metoprolol succinate) .... Take one tablet by mouth daily 5)  Calcium Carbonate-vitamin D 600-400 Mg-unit Tabs (Calcium carbonate-vitamin d) .... Once daily 6)  Vitamin B Complex-c Caps (B complex-c) .... Once daily 7)  Amlodipine Besylate 5 Mg Tabs (Amlodipine besylate) .... Take one tablet by mouth daily 8)  Diltiazem Hcl Er Beads 240 Mg Xr24h-cap (Diltiazem hcl er beads) .Marland Kitchen.. 1 by mouth daily 9)  Osteo Bi-flex Adv Triple St Tabs (Misc natural products) .... Take 1 tablet by mouth once a day 10)  Multivitamins Tabs (Multiple vitamin) .... W/ iron --- once daily 11)  Melatonin 5 Mg Tabs (Melatonin) .... Take 1 tablet by mouth once a day 12)  Acidophilus Probiotic Blend Caps (Probiotic product) .Marland Kitchen.. 1 by mouth daily 13)  Acetaminophen 325 Mg Tabs (Acetaminophen) .... As needed 14)  Digoxin 0.125 Mg Tabs (  Digoxin) .... Take a 1/2  tablet by mouth daily 4 days a week 15)  Valtrex 1 Gm Tabs (Valacyclovir hcl) .Marland Kitchen.. 1 by mouth three times a day for 10 days 16)  Ultracet 37.5-325 Mg Tabs (Tramadol-acetaminophen) .Marland Kitchen.. 1-2 by mouth at bedtime as needed 17)  Vicodin Es 7.5-750 Mg Tabs (Hydrocodone-acetaminophen) .Marland Kitchen.. 1 by mouth every 6 hours as needed 18)  Nystop 100000 Unit/gm Powd (Nystatin) .... Apply three  times a day for up to 2 weeks 19)  Ultram 50 Mg Tabs (Tramadol hcl) .... Take 1 by mouth every 6 hours as needed for pain 20)  Neurontin 300 Mg Caps (Gabapentin) .... Take 1 by mouth at bedtime for 2-3 days then can increase to two times a day for 2-3 days 21)  Neurontin 300 Mg Caps (Gabapentin) .... Then take 1 take three times a day as needed 22)  Lidoderm 5 % Ptch (Lidocaine) .... Apply to affected area for up to 12 hours a day Prescriptions: LIDODERM 5 % PTCH (LIDOCAINE) apply to affected area for up to 12 hours a day  #15 x 0   Entered and Authorized by:   Loreen Freud DO   Signed by:   Loreen Freud DO on 01/06/2010   Method used:   Electronically to        [id number] (retail)       [address]       [place], [place]  [address]       Ph: [phone] or [phone]       Fax: [phone]   RxID:   [id number]    Orders Added: 1)  Est. Patient Level III [84166]

## 2010-03-13 NOTE — Assessment & Plan Note (Signed)
Summary: b-12/cbs  Nurse Visit   Allergies: 1)  ! Cipro (Ciprofloxacin Hcl)  Medication Administration  Injection # 1:    Medication: Vit B12 1000 mcg    Diagnosis: ANEMIA, B12 DEFICIENCY (ICD-281.1)    Route: IM    Site: R deltoid    Exp Date: 04/10/2011    Lot #: [id number]    Mfr: [employer]    Patient tolerated injection without complications    Given by: Almeta Monas CMA (AAMA) (October 11, 2009 11:08 AM)  Orders Added: 1)  Vit B12 1000 mcg [J3420] 2)  Admin of Therapeutic Inj  intramuscular or subcutaneous [16109]

## 2010-03-13 NOTE — Letter (Signed)
Summary: No Show/Missed Appointment  [facility]  [address] [place], [place] [address]   Phone: [phone]  Fax: [phone]               07/10/2009  [name] [address] [place], [place]  [address]   Dear Loreen Freud, DO:  We had an appointment reserved for [name] today and wanted to make you aware, the patient did not show for her appointment.  Thank you for allowing [place] to see this patient and will be glad to continue caring for her in the future.   Sincerely,    Wilhemina Bonito. Marina Goodell, MD [facility]

## 2010-03-13 NOTE — Assessment & Plan Note (Signed)
Summary: Shingles//KP   Vital Signs:  Patient profile:   77 year old female Weight:      149.0 pounds Temp:     97.8 degrees F oral Pulse rate:   60 / minute Pulse rhythm:   irregular BP sitting:   130 / 72  (right arm) Cuff size:   regular  Vitals Entered By: Almeta Monas CMA Duncan Dull) (December 24, 2009 2:33 PM) CC: c/o shingles-- taking valtrex but not improving also had vomitting and diarrhea so she stopped her meds   History of Present Illness: Pt here c/o shingles R side back and under r breast.  Pt c/o significant pain.   Pt c/o rash under L breast that is itchy and painful.  Pt has been taking valtrex.  Current Medications (verified): 1)  Synthroid 88 Mcg Tabs (Levothyroxine Sodium) .... Once Daily 2)  Furosemide 40 Mg  Tabs (Furosemide) .... Take 1 & 1/2 Tab Daily 3)  Nexium 40 Mg Cpdr (Esomeprazole Magnesium) .Marland Kitchen.. 1 By Mouth Two Times A Day 4)  Metoprolol Succinate 25 Mg Xr24h-Tab (Metoprolol Succinate) .... Take One Tablet By Mouth Daily 5)  Calcium Carbonate-Vitamin D 600-400 Mg-Unit  Tabs (Calcium Carbonate-Vitamin D) .... Once Daily 6)  Vitamin B Complex-C   Caps (B Complex-C) .... Once Daily 7)  Amlodipine Besylate 5 Mg Tabs (Amlodipine Besylate) .... Take One Tablet By Mouth Daily 8)  Diltiazem Hcl Er Beads 240 Mg Xr24h-Cap (Diltiazem Hcl Er Beads) .Marland Kitchen.. 1 By Mouth Daily 9)  Osteo Bi-Flex Adv Triple St  Tabs (Misc Natural Products) .... Take 1 Tablet By Mouth Once A Day 10)  Multivitamins   Tabs (Multiple Vitamin) .... W/ Iron --- Once Daily 11)  Melatonin 5 Mg Tabs (Melatonin) .... Take 1 Tablet By Mouth Once A Day 12)  Acidophilus Probiotic Blend  Caps (Probiotic Product) .Marland Kitchen.. 1 By Mouth Daily 13)  Acetaminophen 325 Mg  Tabs (Acetaminophen) .... As Needed 14)  Digoxin 0.125 Mg Tabs (Digoxin) .... Take A 1/2  Tablet By Mouth Daily 4 Days A Week 15)  Valtrex 1 Gm Tabs (Valacyclovir Hcl) .Marland Kitchen.. 1 By Mouth Three Times A Day For 10 Days 16)  Ultracet 37.5-325 Mg Tabs  (Tramadol-Acetaminophen) .Marland Kitchen.. 1-2 By Mouth At Bedtime As Needed 17)  Vicodin Es 7.5-750 Mg Tabs (Hydrocodone-Acetaminophen) .Marland Kitchen.. 1 By Mouth Every 6 Hours As Needed 18)  Nystop 100000 Unit/gm Powd (Nystatin) .... Apply Three Times A Day For Up To 2 Weeks  Allergies (verified): 1)  ! Cipro (Ciprofloxacin Hcl)  Past History:  Past Medical History: Last updated: 10/15/2009  Osteopenia  URI (ICD-465.9) OSTEOPENIA (ICD-733.90) CHEST XRAY, ABNORMAL (ICD-793.1) CERVICAL CANCER, HX OF (ICD-V10.41) B12 DEFICIENCY (ICD-266.2) BENIGN NEOPLASM OF ADRENAL GLAND (ICD-227.0) HYPOTHYROIDISM (ICD-244.9) URINARY INCONTINENCE (ICD-788.30) OSTEOARTHRITIS (ICD-715.90) GERD (ICD-530.81) COPD (ICD-496) EFFUSION, PLEURAL (ICD-511.9) CHEST PAIN (ICD-786.50)....negative Cardiolite in the past..... ANEMIA, B12 DEFICIENCY (ICD-281.1) FAMILY HISTORY DIABETES 1ST DEGREE RELATIVE (ICD-V18.0) FAMILY HISTORY OF CAD FEMALE 1ST DEGREE RELATIVE <60 (ICD-V16.49) FAMILY HISTORY BREAST CANCER 1ST DEGREE RELATIVE <50 (ICD-V16.3) FAMILY HISTORY OF ANEURYSM AORTIC (ICD-V17.4) BREAST CYST, LEFT (ICD-610.0) FOOT SURGERY, HX OF (ICD-V15.89) HEMORRHAGE, SUBARACHNOID (ICD-430)... therefore no Coumadin...or aspirin.. Dr. Dutch Quint neurosurgery .. aneurysm.. no treatment EF        65%  / EF 60%... echo.... November, 2010  /   55-60%...echo..04/12/2009 Aortic insufficiency... mild..echo... 2007 /   mild...echo...04/12/2009 Mitral regurgitation... mild  /  mild... echo... November, 2010 /  mild...echo...04/12/2009 Aortic root dilatation... mild  /  4.5 cm... stable... echo... November, 2010 /  no mention....echo...04/12/2009 DIZZINESS (ICD-780.4) ANEMIA NOS (ICD-285.9) RENAL INSUFFICIENCY (ICD-588.9) PULMONARY EMBOLISM, HX OF (ICD-V12.51).Marland Kitchenand DVT.Marland Kitchen no Coumadin because of subarachnoid hemorrhage HYPERTENSION (ICD-401.9) HYPERLIPIDEMIA (ICD-272.4) DVT, HX OF (ICD-V12.51) CONGESTIVE HEART FAILURE (ICD-428.0) Atrial fibrillation...noted  early 2011..can not take coumadin.. meds adjusted for rate. / Holter monitor Jun 27, 2009.. no rapid heart rate.... mild bradycardia at times.... digoxin stopped Jul 08, 2009  /   September, 2011.... shortness of breath with palpitations when walking... 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 Family History of Aneurysm Aortic Family History Breast cancer 1st degree relative <50 Family History of CAD Female 1st degree relative <60 Family History of Prostate CA 1st degree relative <50 Family History of Stroke M 1st degree relative <50 Family History Diabetes 1st degree relative  Social History: Reviewed history from 08/11/2006 and no changes required. 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     Impression & Recommendations:  Problem # 1:  SHINGLES (ICD-053.9)  finish valtrex vicodin for pain  Orders: Prescription Created Electronically [phone])  Problem # 2:  TINEA CORPORIS (ICD-110.5)  nystatin powder  Orders: Prescription Created Electronically [phone])  Complete Medication List: 1)  Synthroid 88 Mcg Tabs (Levothyroxine sodium) .... Once daily 2)  Furosemide 40 Mg Tabs (Furosemide) .... Take 1 & 1/2 tab daily 3)  Nexium 40 Mg Cpdr (Esomeprazole magnesium) .Marland Kitchen.. 1 by mouth two times a day 4)  Metoprolol Succinate 25 Mg Xr24h-tab (Metoprolol succinate) .... Take one tablet by mouth daily 5)  Calcium Carbonate-vitamin D 600-400 Mg-unit Tabs (Calcium carbonate-vitamin d) .... Once daily 6)  Vitamin B Complex-c Caps (B complex-c) .... Once daily 7)  Amlodipine Besylate 5 Mg Tabs (Amlodipine besylate) .... Take one tablet by mouth daily 8)  Diltiazem Hcl Er Beads 240 Mg Xr24h-cap (Diltiazem hcl er beads) .Marland Kitchen.. 1 by mouth daily 9)  Osteo Bi-flex Adv Triple St Tabs (Misc natural products) .... Take 1 tablet by mouth once a day 10)  Multivitamins Tabs (Multiple vitamin) .... W/ iron --- once daily 11)  Melatonin 5 Mg Tabs (Melatonin) .... Take 1 tablet by mouth once a day 12)  Acidophilus Probiotic Blend Caps (Probiotic product) .Marland Kitchen.. 1 by mouth daily 13)  Acetaminophen 325 Mg Tabs (Acetaminophen) .... As needed 14)  Digoxin 0.125 Mg Tabs (Digoxin) .... Take a 1/2  tablet by mouth daily 4 days a week 15)  Valtrex 1 Gm Tabs (Valacyclovir hcl) .Marland Kitchen.. 1 by mouth three times a day  for 10 days 16)  Ultracet 37.5-325 Mg Tabs (Tramadol-acetaminophen) .Marland Kitchen.. 1-2 by mouth at bedtime as needed 17)  Vicodin Es 7.5-750 Mg Tabs (Hydrocodone-acetaminophen) .Marland Kitchen.. 1 by mouth every 6 hours as needed 18)  Nystop 100000 Unit/gm Powd (Nystatin) .... Apply three times a day for up to 2 weeks Prescriptions: NYSTOP 100000 UNIT/GM POWD (NYSTATIN) apply three times a day for up to 2 weeks   #1 bottle x 0   Entered and Authorized by:   Loreen Freud DO   Signed by:   Loreen Freud DO on 12/24/2009   Method used:   Print then Give to Patient   RxID:   [id number] VICODIN ES 7.5-750 MG TABS (HYDROCODONE-ACETAMINOPHEN) 1 by mouth every 6 hours as needed  #30 x 0   Entered and Authorized by:   Loreen Freud DO   Signed by:   Loreen Freud DO on 12/24/2009   Method used:   Print then Give to Patient   RxID:   [id number]    Orders Added: 1)  Est. Patient Level III [72536] 2)  Prescription Created Electronically [phone]

## 2010-03-13 NOTE — Assessment & Plan Note (Signed)
Summary: thrush/ alr   Vital Signs:  Patient profile:   77 year old female Weight:      161 pounds Temp:     98.1 degrees F oral Pulse rate:   86 / minute Pulse rhythm:   regular BP sitting:   122 / 84  (left arm) Cuff size:   regular  Vitals Entered By: Army Fossa CMA (March 28, 2009 11:47 AM) CC: Pt would like to discuss cipro and flagly. Has had diarrhea.    History of Present Illness: Pt here c/o thrush from abx.   Pt stopped cipro because of diarrhea ,  itching and they stopped. Pt still taking flagyl but still c/o abd discomfort and bloating.   Current Medications (verified): 1)  Omeprazole 40 Mg  Cpdr (Omeprazole) .Marland Kitchen.. 1 By Mouth Once Daily 2)  Synthroid 100 Mcg Tabs (Levothyroxine Sodium) .... Take One Tablet By Mouth Once Daily. 3)  Zocor 80 Mg  Tabs (Simvastatin) .... 1/2 At Bedtime 4)  Vitamin B-6 Cr 200 Mg Cr-Tabs (Pyridoxine Hcl) .... Take 1 Tab Once Daily 5)  Vitamin B-12   Tabs (Cyanocobalamin Tabs) .... Take 1 Tablet By Mouth Two Times A Day 6)  Caltrate 600+d   Tabs (Calcium Carbonate-Vitamin D Tabs) .... Take 1 Tablet By Mouth Three Times A Day 7)  Norvasc 10 Mg Tabs (Amlodipine Besylate) .... Take 1 Tab Once Daily 8)  Fosamax 70 Mg Tabs (Alendronate Sodium) .Marland Kitchen.. 1 By Mouth Weekly 9)  Osteo Bi-Flex .... Take 1 Tablet By Mouth Two Times A Day 10)  Stool Softner .... Take 1 Tablet By Mouth Once A Day 11)  Geritol Complete   Tabs (Iron-Vitamins) .... Take 1 Tablet By Mouth Once A Day 12)  Furosemide 40 Mg  Tabs (Furosemide) .... Take One Tablet Every Morning. 13)  Vitamin C & E .... 1 By Mouth Once Daily 14)  Omega-3 Fish Oil 1200 Mg Caps (Omega-3 Fatty Acids) .... Take 1 Tab Two Times A Day 15)  Tylenol 8 Hour 650 Mg  Cr-Tabs (Acetaminophen) .Marland Kitchen.. 1 By Mouth Every 8 Hours As Needed 16)  Antivert 25 Mg Tabs (Meclizine Hcl) .Marland Kitchen.. 1 By Mouth Three Times A Day As Needed 17)  Mucinex 600 Mg Xr12h-Tab (Guaifenesin) 18)  Amitriptyline Hcl 10 Mg Tabs  (Amitriptyline Hcl) .Marland Kitchen.. 1 By Mouth At Bedtime As Needed 19)  Icaps  Caps (Multiple Vitamins-Minerals) .... Take 1 Tab in Am 1 Tab Pm 20)  Vitamin D3 1000 Unit Tabs (Cholecalciferol) .... Take 1 Tab Once Daily 21)  Eql Vision Formula  Tabs (Multiple Vitamins-Minerals) .... Take 1 Tab Once Daily 22)  Glucosamine 1500 Complex  Caps (Glucosamine-Chondroit-Vit C-Mn) .... Take 1 Tab Two Times A Day 23)  B Complex-C  Tabs (B Complex-C) .... Take 1 Tab Once Daily 24)  Amlodipine Besylate 10 Mg Tabs (Amlodipine Besylate) .... Take One Tablet By Mouth Daily 25)  Amitriptyline Hcl 10 Mg Tabs (Amitriptyline Hcl) .... As Needed 26)  Ultram 50 Mg Tabs (Tramadol Hcl) .Marland Kitchen.. 1-2 By Mouth Every 6 Hours As Needed 27)  Vision Formula  Tabs (Multiple Vitamins-Minerals) .... Take One Tablet By Mouth Once Daily. 28)  Osteo Bi-Flex Regular Strength 250-200 Mg Tabs (Glucosamine-Chondroitin) .... Take One Tablet By Mouth Once Daily. 29)  Detrol La 4 Mg Xr24h-Cap (Tolterodine Tartrate) .Marland Kitchen.. 1 By Mouth Once Daily 30)  Flagyl 500 Mg Tabs (Metronidazole) .... Three Times A Day For 14 Days. 31)  Nystatin 100000 Unit/ml Susp (Nystatin) .... 5 Ml Swish and  Spit Qid 32)  Augmentin 500-125 Mg Tabs (Amoxicillin-Pot Clavulanate) .Marland Kitchen.. 1 By Mouth Two Times A Day With A Meal  Allergies (verified): 1)  ! Cipro (Ciprofloxacin Hcl)  Past History:  Past medical, surgical, family and social histories (including risk factors) reviewed for relevance to current acute and chronic problems.  Past Medical History: Reviewed history from 12/24/2008 and no changes required.  Osteopenia  URI (ICD-465.9) OSTEOPENIA (ICD-733.90) CHEST XRAY, ABNORMAL (ICD-793.1) CERVICAL CANCER, HX OF (ICD-V10.41) B12 DEFICIENCY (ICD-266.2) BENIGN NEOPLASM OF ADRENAL GLAND (ICD-227.0) HYPOTHYROIDISM (ICD-244.9) URINARY INCONTINENCE (ICD-788.30) OSTEOARTHRITIS (ICD-715.90) GERD (ICD-530.81) COPD (ICD-496) EFFUSION, PLEURAL (ICD-511.9) CHEST PAIN  (ICD-786.50)....negative Cardiolite in the past..... ANEMIA, B12 DEFICIENCY (ICD-281.1) FAMILY HISTORY DIABETES 1ST DEGREE RELATIVE (ICD-V18.0) FAMILY HISTORY OF CAD FEMALE 1ST DEGREE RELATIVE <60 (ICD-V16.49) FAMILY HISTORY BREAST CANCER 1ST DEGREE RELATIVE <50 (ICD-V16.3) FAMILY HISTORY OF ANEURYSM AORTIC (ICD-V17.4) BREAST CYST, LEFT (ICD-610.0) FOOT SURGERY, HX OF (ICD-V15.89) HEMORRHAGE, SUBARACHNOID (ICD-430)... therefore no Coumadin...or aspirin.. Dr. Dutch Quint neurosurgery .. aneurysm.. no treatment Ejection fraction 65%  / EF 60%... echo.... November, 2010 Aortic insufficiency... mild..echo... 2007 Mitral regurgitation... mild  /  mild... echo... November, 2010 Aortic root dilatation... mild  /  4.5 cm... stable... echo... November, 2010 DIZZINESS (ICD-780.4) ANEMIA NOS (ICD-285.9) RENAL INSUFFICIENCY (ICD-588.9) PULMONARY EMBOLISM, HX OF (ICD-V12.51).Marland Kitchenand DVT.Marland Kitchen no Coumadin because of subarachnoid hemorrhage HYPERTENSION (ICD-401.9) HYPERLIPIDEMIA (ICD-272.4) DVT, HX OF (ICD-V12.51) CONGESTIVE HEART FAILURE (ICD-428.0)    Past Surgical History: Reviewed history from 09/05/2007 and no changes required. Cataract extraction Cholecystectomy PE--- vena cava filter Hip fracture ORIF (07/2007)  Family History: Reviewed history from 08/11/2006 and no changes required. Family History of Aneurysm Aortic Family History Breast cancer 1st degree relative <50 Family History of CAD Female 1st degree relative <60 Family History of Prostate CA 1st degree relative <50 Family History of Stroke M 1st degree relative <50 Family History Diabetes 1st degree relative  Social History: Reviewed history from 08/11/2006 and no changes required. Retired--  hotels Divorced Never Smoked Alcohol use-yes Drug use-no Regular exercise-yes  Review of Systems      See HPI  Physical Exam  General:  Well-developed,well-nourished,in no acute distress; alert,appropriate and cooperative throughout  examination Lungs:  Normal respiratory effort, chest expands symmetrically. Lungs are clear to auscultation, no crackles or wheezes. Abdomen:  LLq tenderness soft.   Psych:  Cognition and judgment appear intact. Alert and cooperative with normal attention span and concentration. No apparent delusions, illusions, hallucinations   Impression & Recommendations:  Problem # 1:  DIVERTICULITIS, ACUTE (ICD-562.11)  finish abx if no better 2-3 days ---go to GI if symptoms worsen go to ER  Labs Reviewed: Hgb: 12.4 (03/15/2009)   Hct: 38.9 (03/15/2009)   WBC: 5.8 (03/15/2009)  Problem # 2:  THRUSH (ICD-112.0) nystatin swish and swallow  Complete Medication List: 1)  Omeprazole 40 Mg Cpdr (Omeprazole) .Marland Kitchen.. 1 by mouth once daily 2)  Synthroid 100 Mcg Tabs (Levothyroxine sodium) .... Take one tablet by mouth once daily. 3)  Zocor 80 Mg Tabs (Simvastatin) .... 1/2 at bedtime 4)  Vitamin B-6 Cr 200 Mg Cr-tabs (Pyridoxine hcl) .... Take 1 tab once daily 5)  Vitamin B-12 Tabs (Cyanocobalamin tabs) .... Take 1 tablet by mouth two times a day 6)  Caltrate 600+d Tabs (Calcium carbonate-vitamin d tabs) .... Take 1 tablet by mouth three times a day 7)  Norvasc 10 Mg Tabs (Amlodipine besylate) .... Take 1 tab once daily 8)  Fosamax 70 Mg Tabs (Alendronate sodium) .Marland Kitchen.. 1 by mouth weekly 9)  Osteo  Bi-flex  .... Take 1 tablet by mouth two times a day 10)  Stool Softner  .... Take 1 tablet by mouth once a day 11)  Geritol Complete Tabs (Iron-vitamins) .... Take 1 tablet by mouth once a day 12)  Furosemide 40 Mg Tabs (Furosemide) .... Take one tablet every morning. 13)  Vitamin C & E  .... 1 by mouth once daily 14)  Omega-3 Fish Oil 1200 Mg Caps (Omega-3 fatty acids) .... Take 1 tab two times a day 15)  Tylenol 8 Hour 650 Mg Cr-tabs (Acetaminophen) .Marland Kitchen.. 1 by mouth every 8 hours as needed 16)  Antivert 25 Mg Tabs (Meclizine hcl) .Marland Kitchen.. 1 by mouth three times a day as needed 17)  Mucinex 600 Mg Xr12h-tab  (Guaifenesin) 18)  Amitriptyline Hcl 10 Mg Tabs (Amitriptyline hcl) .Marland Kitchen.. 1 by mouth at bedtime as needed 19)  Icaps Caps (Multiple vitamins-minerals) .... Take 1 tab in am 1 tab pm 20)  Vitamin D3 1000 Unit Tabs (Cholecalciferol) .... Take 1 tab once daily 21)  Eql Vision Formula Tabs (Multiple vitamins-minerals) .... Take 1 tab once daily 22)  Glucosamine 1500 Complex Caps (Glucosamine-chondroit-vit c-mn) .... Take 1 tab two times a day 23)  B Complex-c Tabs (B complex-c) .... Take 1 tab once daily 24)  Amlodipine Besylate 10 Mg Tabs (Amlodipine besylate) .... Take one tablet by mouth daily 25)  Amitriptyline Hcl 10 Mg Tabs (Amitriptyline hcl) .... As needed 26)  Ultram 50 Mg Tabs (Tramadol hcl) .Marland Kitchen.. 1-2 by mouth every 6 hours as needed 27)  Vision Formula Tabs (Multiple vitamins-minerals) .... Take one tablet by mouth once daily. 28)  Osteo Bi-flex Regular Strength 250-200 Mg Tabs (Glucosamine-chondroitin) .... Take one tablet by mouth once daily. 29)  Detrol La 4 Mg Xr24h-cap (Tolterodine tartrate) .Marland Kitchen.. 1 by mouth once daily 30)  Flagyl 500 Mg Tabs (Metronidazole) .... Three times a day for 14 days. 31)  Nystatin 100000 Unit/ml Susp (Nystatin) .... 5 ml swish and spit qid 32)  Augmentin 500-125 Mg Tabs (Amoxicillin-pot clavulanate) .Marland Kitchen.. 1 by mouth two times a day with a meal Prescriptions: AUGMENTIN 500-125 MG TABS (AMOXICILLIN-POT CLAVULANATE) 1 by mouth two times a day with a meal  #14 x 0   Entered and Authorized by:   Loreen Freud DO   Signed by:   Loreen Freud DO on 03/28/2009   Method used:   Electronically to        [id number] (retail)       [address]       [place], [place]  [address]       Ph: [phone] or [phone]       Fax: [phone]   RxID:   [id number] NYSTATIN 100000 UNIT/ML SUSP (NYSTATIN) 5 ml swish and spit qid  #150cc x 0   Entered and Authorized by:   Loreen Freud DO   Signed by:   Loreen Freud DO on 03/28/2009   Method used:   Electronically  to        [id number] (retail)       [address]       [place], [place]  [address]       Ph: [phone] or [phone]       Fax: [phone]   RxID:   [id number]

## 2010-03-13 NOTE — Procedures (Signed)
Summary: Summary Report  Summary Report   Imported By: Erle Crocker 07/12/2009 14:19:37  _____________________________________________________________________  External Attachment:    Type:   Image     Comment:   External Document

## 2010-03-13 NOTE — Assessment & Plan Note (Signed)
Summary: fu from dr Myrtis Ser kdc b12 shot/kdc   Vital Signs:  Patient profile:   77 year old female Weight:      159.13 pounds Pulse rate:   86 / minute Pulse rhythm:   irregular BP sitting:   126 / 70  (left arm) Cuff size:   regular  Vitals Entered By: Army Fossa CMA (May 27, 2009 3:57 PM) CC: Pt here to discuss what Dr.Katz is doing, and discuss going back on Osteo biflex. B12 injection.   History of Present Illness: Pt here for f/u from Dr Kendrick Fries is now on Digoxin.   He is hoping that she will be less fatigued and less sob with rate control.  Pt still very tired and sob.  Pt also with swelling in legs---Dr Myrtis Ser has her wearing compression stockings.   Pt wants to start b12 again. Pt c/o cough--dry.  She feels like something is stuck in her throat.   pt is taking nexium once daily.  No allergy symptoms.    Current Medications (verified): 1)  Synthroid 88 Mcg Tabs (Levothyroxine Sodium) .... Once Daily 2)  Furosemide 40 Mg  Tabs (Furosemide) .... Take One and A Half Tablets Once Daily 3)  Align  Caps (Probiotic Product) 4)  Nexium 40 Mg Cpdr (Esomeprazole Magnesium) .Marland Kitchen.. 1 By Mouth Once Daily 5)  Cardizem Cd 240 Mg Xr24h-Cap (Diltiazem Hcl Coated Beads) .... One Tab By Mouth Once Daily 6)  Metoprolol Succinate 25 Mg Xr24h-Tab (Metoprolol Succinate) .... Take One Tablet By Mouth Daily 7)  Calcium Carbonate-Vitamin D 600-400 Mg-Unit  Tabs (Calcium Carbonate-Vitamin D) .... Once Daily 8)  Vitamin B Complex-C   Caps (B Complex-C) .... Once Daily 9)  Amlodipine Besylate 5 Mg Tabs (Amlodipine Besylate) .... Take One Tablet By Mouth Daily 10)  Digoxin 0.125 Mg Tabs (Digoxin) .... Take 1/2 Tab 4 Times A Day Times 3 Days :then 1/2 Tab Everyday 11)  Detrol La 4 Mg Xr24h-Cap (Tolterodine Tartrate) .Marland Kitchen.. 1 By Mouth Daily. 12)  Diltiazem Hcl Er Beads 240 Mg Xr24h-Cap (Diltiazem Hcl Er Beads) .Marland Kitchen.. 1 By Mouth Daily 13)  Osteo Bi-Flex Adv Triple St  Tabs (Misc Natural  Products)  Allergies: 1)  ! Cipro (Ciprofloxacin Hcl)  Past History:  Past medical, surgical, family and social histories (including risk factors) reviewed for relevance to current acute and chronic problems.  Past Medical History: Reviewed history from 04/15/2009 and no changes required.  Osteopenia  URI (ICD-465.9) OSTEOPENIA (ICD-733.90) CHEST XRAY, ABNORMAL (ICD-793.1) CERVICAL CANCER, HX OF (ICD-V10.41) B12 DEFICIENCY (ICD-266.2) BENIGN NEOPLASM OF ADRENAL GLAND (ICD-227.0) HYPOTHYROIDISM (ICD-244.9) URINARY INCONTINENCE (ICD-788.30) OSTEOARTHRITIS (ICD-715.90) GERD (ICD-530.81) COPD (ICD-496) EFFUSION, PLEURAL (ICD-511.9) CHEST PAIN (ICD-786.50)....negative Cardiolite in the past..... ANEMIA, B12 DEFICIENCY (ICD-281.1) FAMILY HISTORY DIABETES 1ST DEGREE RELATIVE (ICD-V18.0) FAMILY HISTORY OF CAD FEMALE 1ST DEGREE RELATIVE <60 (ICD-V16.49) FAMILY HISTORY BREAST CANCER 1ST DEGREE RELATIVE <50 (ICD-V16.3) FAMILY HISTORY OF ANEURYSM AORTIC (ICD-V17.4) BREAST CYST, LEFT (ICD-610.0) FOOT SURGERY, HX OF (ICD-V15.89) HEMORRHAGE, SUBARACHNOID (ICD-430)... therefore no Coumadin...or aspirin.. Dr. Dutch Quint neurosurgery .. aneurysm.. no treatment Ejection fraction 65%  / EF 60%... echo.... November, 2010  /   55-60%...echo..04/12/2009 Aortic insufficiency... mild..echo... 2007 /   mild...echo...04/12/2009 Mitral regurgitation... mild  /  mild... echo... November, 2010 /  mild...echo...04/12/2009 Aortic root dilatation... mild  /  4.5 cm... stable... echo... November, 2010 /  no mention....echo...04/12/2009 DIZZINESS (ICD-780.4) ANEMIA NOS (ICD-285.9) RENAL INSUFFICIENCY (ICD-588.9) PULMONARY EMBOLISM, HX OF (ICD-V12.51).Marland Kitchenand DVT.Marland Kitchen no Coumadin because of subarachnoid hemorrhage HYPERTENSION (ICD-401.9) HYPERLIPIDEMIA (ICD-272.4) DVT, HX  OF (ICD-V12.51) CONGESTIVE HEART FAILURE (ICD-428.0) Atrial fibrillation...noted early 2011..can not take coumadin.. meds adjusted for rate.    Past  Surgical History: Reviewed history from 09/05/2007 and no changes required. Cataract extraction Cholecystectomy PE--- vena cava filter Hip fracture ORIF (07/2007)  Family History: Reviewed history from 08/11/2006 and no changes required. Family History of Aneurysm Aortic Family History Breast cancer 1st degree relative <50 Family History of CAD Female 1st degree relative <60 Family History of Prostate CA 1st degree relative <50 Family History of Stroke M 1st degree relative <50 Family History Diabetes 1st degree relative  Social History: Reviewed history from 08/11/2006 and no changes required. 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 Lungs:  Normal respiratory effort, chest expands symmetrically. Lungs are clear to auscultation, no crackles or wheezes. Heart:  irreg irreg Extremities:  1+ left pedal edema, left pretibial edema, 1+ right pedal edema, and right pretibial edema.   compression stockings on Psych:  Oriented X3 and normally interactive.        Impression & Recommendations:  Problem # 1:  EDEMA (ICD-782.3)  Her updated medication list for this problem includes:    Furosemide 40 Mg Tabs (Furosemide) .Marland Kitchen... Take one and a half tablets once daily  Discussed elevation of the legs, use of compression stockings, sodium restiction, and medication use.   Problem # 2:  ATRIAL FIBRILLATION WITH RAPID VENTRICULAR RESPONSE (ICD-427.31) f/u cardio Her updated medication list for this problem includes:    Cardizem Cd 240 Mg Xr24h-cap (Diltiazem hcl coated beads) ..... One tab by mouth once daily    Metoprolol Succinate 25 Mg Xr24h-tab (Metoprolol succinate) .Marland Kitchen... Take one tablet by mouth daily    Amlodipine  Besylate 5 Mg Tabs (Amlodipine besylate) .Marland Kitchen... Take one tablet by mouth daily    Digoxin 0.125 Mg Tabs (Digoxin) .Marland Kitchen... Take 1/2 tab 4 times a day times 3 days :then 1/2 tab everyday    Diltiazem Hcl Er Beads 240 Mg Xr24h-cap (Diltiazem hcl er beads) .Marland Kitchen... 1 by mouth daily  Problem # 3:  SHORTNESS OF BREATH (ICD-786.05)  Problem # 4:  FATIGUE (ICD-780.79) b12 started today again rto 1 week x 3 more weeks then 1 x monthly cardio hoping that with controlled rate pt will begin to feel better  Problem # 5:  COUGH (ICD-786.2) mucinex call or rto as needed   Complete Medication List: 1)  Synthroid 88 Mcg Tabs (Levothyroxine sodium) .... Once daily 2)  Furosemide 40 Mg Tabs (Furosemide) .... Take one and a half tablets once daily 3)  Align Caps (Probiotic product) 4)  Nexium 40 Mg Cpdr (Esomeprazole magnesium) .Marland Kitchen.. 1 by mouth once daily 5)  Cardizem Cd 240 Mg Xr24h-cap (Diltiazem hcl coated beads) .... One tab by mouth once daily 6)  Metoprolol Succinate 25 Mg Xr24h-tab (Metoprolol succinate) .... Take one tablet by mouth daily 7)  Calcium Carbonate-vitamin D 600-400 Mg-unit Tabs (Calcium carbonate-vitamin d) .... Once daily 8)  Vitamin B Complex-c Caps (B complex-c) .... Once daily 9)  Amlodipine Besylate 5 Mg Tabs (Amlodipine besylate) .... Take one tablet by mouth daily 10)  Digoxin 0.125 Mg Tabs (Digoxin) .... Take 1/2 tab 4 times a day times 3 days :then 1/2 tab everyday 11)  Detrol La 4 Mg Xr24h-cap (Tolterodine tartrate) .Marland KitchenMarland KitchenMarland Kitchen 1  by mouth daily. 12)  Diltiazem Hcl Er Beads 240 Mg Xr24h-cap (Diltiazem hcl er beads) .Marland Kitchen.. 1 by mouth daily 13)  Osteo Bi-flex Adv Triple St Tabs (Misc natural products)  Other Orders: Admin of Therapeutic Inj  intramuscular or subcutaneous (16109) Vit B12 1000 mcg (U0454)   Medication Administration  Injection # 1:    Medication: Vit B12 1000 mcg    Diagnosis: B12 DEFICIENCY (ICD-266.2)    Route: IM    Site: R deltoid    Exp Date: 03/2011    Lot #:  [id number]    Mfr: [employer]  Orders Added: 1)  Admin of Therapeutic Inj  intramuscular or subcutaneous [96372] 2)  Vit B12 1000 mcg [J3420] 3)  Est. Patient Level III [09811]

## 2010-03-13 NOTE — Assessment & Plan Note (Signed)
Summary: F/U ECHO DONE AT 2PM.  Medications Added FOSAMAX 70 MG TABS (ALENDRONATE SODIUM) 1 by mouth weekly -- HOLD FUROSEMIDE 40 MG  TABS (FUROSEMIDE) Take one and a half tablets once daily VITAMIN D3 1000 UNIT TABS (CHOLECALCIFEROL) take 1 tab once daily---HOLD CARDIZEM CD 240 MG XR24H-CAP (DILTIAZEM HCL COATED BEADS) one tab by mouth once daily      Allergies Added:   Visit Type:  Follow-up Primary Provider:  Loreen Freud DO  CC:  follow echo.  History of Present Illness: The patient is 77 years old and return for a followup visit for management of her atrial fibrillation and diastolic heart failure. She has a history of hypertension, diastolic heart failure, and pulmonary embolism and DVT. She is not a Coumadin candidate because of her previous subarachnoid hemorrhage.  She was seen 2 days ago by Dr. Riley Kill with symptoms of palpitations and shortness of breath and was found to be in atrial fibrillation with a rapid response. Her chest x-ray showed interstitial pulmonary edema. Dr. Riley Kill started her on Cardizem CD 100 mg daily and cut her Norvasc back from 10-5 mg daily. He continued her Lasix at 40 mg daily. She returned today for followup management and for an echocardiogram.  She says she still gets a feeling of a rapid heartbeat and shortness of breath with minimal activity such as walking across the floor. she has had no chest pain.      Current Medications (verified): 1)  Synthroid 100 Mcg Tabs (Levothyroxine Sodium) .... Take One Tablet By Mouth Once Daily. 2)  Zocor 80 Mg  Tabs (Simvastatin) .... On Hold 3)  Fosamax 70 Mg Tabs (Alendronate Sodium) .Marland Kitchen.. 1 By Mouth Weekly -- Hold 4)  Furosemide 40 Mg  Tabs (Furosemide) .... Take One Tablet Every Morning. 5)  Antivert 25 Mg Tabs (Meclizine Hcl) .Marland Kitchen.. 1 By Mouth Three Times A Day As Needed 6)  Amitriptyline Hcl 10 Mg Tabs (Amitriptyline Hcl) .Marland Kitchen.. 1 By Mouth At Bedtime As Needed 7)  Vitamin D3 1000 Unit Tabs  (Cholecalciferol) .... Take 1 Tab Once Daily---Hold 8)  Amlodipine Besylate 5 Mg Tabs (Amlodipine Besylate) .... Take One Tablet By Mouth Daily 9)  Ultram 50 Mg Tabs (Tramadol Hcl) .Marland Kitchen.. 1-2 By Mouth Every 6 Hours As Needed 10)  Detrol La 4 Mg Xr24h-Cap (Tolterodine Tartrate) .Marland Kitchen.. 1 By Mouth Once Daily 11)  Align  Caps (Probiotic Product) 12)  Nexium 40 Mg Cpdr (Esomeprazole Magnesium) .Marland Kitchen.. 1 By Mouth Once Daily 13)  Cardizem Cd 180 Mg Xr24h-Cap (Diltiazem Hcl Coated Beads) .... Take One Capsule By Mouth Daily  Allergies (verified): 1)  ! Cipro (Ciprofloxacin Hcl)  Past History:  Past Medical History: Reviewed history from 12/24/2008 and no changes required.  Osteopenia  URI (ICD-465.9) OSTEOPENIA (ICD-733.90) CHEST XRAY, ABNORMAL (ICD-793.1) CERVICAL CANCER, HX OF (ICD-V10.41) B12 DEFICIENCY (ICD-266.2) BENIGN NEOPLASM OF ADRENAL GLAND (ICD-227.0) HYPOTHYROIDISM (ICD-244.9) URINARY INCONTINENCE (ICD-788.30) OSTEOARTHRITIS (ICD-715.90) GERD (ICD-530.81) COPD (ICD-496) EFFUSION, PLEURAL (ICD-511.9) CHEST PAIN (ICD-786.50)....negative Cardiolite in the past..... ANEMIA, B12 DEFICIENCY (ICD-281.1) FAMILY HISTORY DIABETES 1ST DEGREE RELATIVE (ICD-V18.0) FAMILY HISTORY OF CAD FEMALE 1ST DEGREE RELATIVE <60 (ICD-V16.49) FAMILY HISTORY BREAST CANCER 1ST DEGREE RELATIVE <50 (ICD-V16.3) FAMILY HISTORY OF ANEURYSM AORTIC (ICD-V17.4) BREAST CYST, LEFT (ICD-610.0) FOOT SURGERY, HX OF (ICD-V15.89) HEMORRHAGE, SUBARACHNOID (ICD-430)... therefore no Coumadin...or aspirin.. Dr. Dutch Quint neurosurgery .. aneurysm.. no treatment Ejection fraction 65%  / EF 60%... echo.... November, 2010 Aortic insufficiency... mild..echo... 2007 Mitral regurgitation... mild  /  mild... echo... November, 2010 Aortic root dilatation.Marland KitchenMarland Kitchen  mild  /  4.5 cm... stable... echo... November, 2010 DIZZINESS (ICD-780.4) ANEMIA NOS (ICD-285.9) RENAL INSUFFICIENCY (ICD-588.9) PULMONARY EMBOLISM, HX OF (ICD-V12.51).Marland Kitchenand DVT.Marland Kitchen  no Coumadin because of subarachnoid hemorrhage HYPERTENSION (ICD-401.9) HYPERLIPIDEMIA (ICD-272.4) DVT, HX OF (ICD-V12.51) CONGESTIVE HEART FAILURE (ICD-428.0)    Review of Systems       ROS is negative except as outlined in HPI.   Vital Signs:  Patient profile:   77 year old female Height:      63 inches Weight:      161 pounds Pulse rate:   75 / minute BP sitting:   146 / 76  (left arm) Cuff size:   regular  Vitals Entered By: Hardin Negus, RMA (April 12, 2009 5:00 PM)  Physical Exam  Additional Exam:  Gen. Well-nourished, in no distress   Neck: JVP 2 cm above the clavicle, thyroid not enlarged, no carotid bruits Lungs: No tachypnea, clear without rales, rhonchi or wheezes Cardiovascular: Rhythm irregular, PMI not displaced,  heart sounds  normal, no murmurs or gallops, 1+ bilateral peripheral edema, pulses normal in all 4 extremities. Abdomen: BS normal, abdomen soft and non-tender without masses or organomegaly, no hepatosplenomegaly. MS: No deformities, no cyanosis or clubbing   Neuro:  No focal sns   Skin:  no lesions    Impression & Recommendations:  Problem # 1:  ATRIAL FIBRILLATION WITH RAPID VENTRICULAR RESPONSE (ICD-427.31) She has persistent atrial fibrillation. Her rate has slowed some but is still slightly fast. She is also symptomatic with it. We will increase her Cardizem CD from 180 mg to 240 mg daily. We will discontinue her amlodipine. She is not a Coumadin candidate because of a previous subarachnoid hemorrhage and she says she's also not a candidate for aspirin.  Problem # 2:  CONGESTIVE HEART FAILURE (ICD-428.0) She has a history of diastolic heart failure and still has volume overload today with JVD and peripheral edema. She also short of breath with minimal exertion. We will increase her Lasix by giving her next to 40 mg today and increasing her to 40 mg b.i.d. Saturday and Sunday and then put her on 60 mg maintenance dose on Monday. She is scheduled  to see Dr. Myrtis Ser on Tuesday. Her current maintenance dose is 40 mg. We'll also get a BMP on Tuesday.  She had an echocardiogram today and she has very good LV systolic function. It appears she has left atrial enlargement indicative of diastolic dysfunction.  The following medications were removed from the medication list:    Amlodipine Besylate 5 Mg Tabs (Amlodipine besylate) .Marland Kitchen... Take one tablet by mouth daily Her updated medication list for this problem includes:    Furosemide 40 Mg Tabs (Furosemide) .Marland Kitchen... Take one and a half tablets once daily    Cardizem Cd 240 Mg Xr24h-cap (Diltiazem hcl coated beads) ..... One tab by mouth once daily  Problem # 3:  HYPERTENSION (ICD-401.9) Her blood pressure is under fairly good control today. We will make changes as outlined under atrial fibrillation. The following medications were removed from the medication list:    Amlodipine Besylate 5 Mg Tabs (Amlodipine besylate) .Marland Kitchen... Take one tablet by mouth daily Her updated medication list for this problem includes:    Furosemide 40 Mg Tabs (Furosemide) .Marland Kitchen... Take one and a half tablets once daily    Cardizem Cd 240 Mg Xr24h-cap (Diltiazem hcl coated beads) ..... One tab by mouth once daily  Patient Instructions: 1)  Your physician recommends that you keep your follow-up appointment on  Tues April 16, 2009 with Dr. Myrtis Ser @ 2:30pm. 2)  Your physician recommends that you return for lab work on tues: bmet (428.0) 3)  Your physician has recommended you make the following change in your medication: 1) Increase cardizem CD to 240mg  once daily , 2) take lasix 40mg  when you get home today. Saturday and sunday take lasix 40mg  two times a day. Starting monday take lasix 40mg  one and a half tablets once daily.

## 2010-03-13 NOTE — Progress Notes (Signed)
Summary: feet swelling  Phone Note Call from Patient Call back at Home Phone [phone]   Reason for Call: Talk to Nurse Summary of Call: request to speak to nurse, feet are swelling Initial call taken by: Migdalia Dk,  August 15, 2009 9:15 AM  Follow-up for Phone Call        legs have been really swollen for past 3-4 days, pts wt is up about 3-4lbs in a week, she takes furosemdie 2 tabs on Mon and Thur and 1 tab on the rest of the days, she took 2nd tab today about 5pm, advised pt to weight herself in the am and give me a call and let me know how she is doing, she is agreeable Meredith Staggers, RN  August 15, 2009 6:37 PM   pts wt down 1 lb from [place]., her feet are still a little swollen but much better than they were yest.  she is seeing Dr Laury Axon today, will cont to monitor and let me know if legs swell back up Meredith Staggers, RN  August 16, 2009 9:59 AM

## 2010-03-17 ENCOUNTER — Ambulatory Visit: Payer: Self-pay

## 2010-03-18 ENCOUNTER — Encounter: Payer: Self-pay | Admitting: Family Medicine

## 2010-03-18 ENCOUNTER — Ambulatory Visit (INDEPENDENT_AMBULATORY_CARE_PROVIDER_SITE_OTHER): Payer: Medicare Other

## 2010-03-18 DIAGNOSIS — D518 Other vitamin B12 deficiency anemias: Secondary | ICD-10-CM

## 2010-03-19 ENCOUNTER — Other Ambulatory Visit: Payer: Self-pay | Admitting: Family Medicine

## 2010-03-19 ENCOUNTER — Ambulatory Visit (INDEPENDENT_AMBULATORY_CARE_PROVIDER_SITE_OTHER): Payer: Medicare Other | Admitting: Family Medicine

## 2010-03-19 ENCOUNTER — Encounter: Payer: Self-pay | Admitting: Family Medicine

## 2010-03-19 DIAGNOSIS — D518 Other vitamin B12 deficiency anemias: Secondary | ICD-10-CM

## 2010-03-19 DIAGNOSIS — N259 Disorder resulting from impaired renal tubular function, unspecified: Secondary | ICD-10-CM

## 2010-03-19 DIAGNOSIS — I10 Essential (primary) hypertension: Secondary | ICD-10-CM

## 2010-03-19 DIAGNOSIS — D696 Thrombocytopenia, unspecified: Secondary | ICD-10-CM

## 2010-03-19 DIAGNOSIS — T148XXA Other injury of unspecified body region, initial encounter: Secondary | ICD-10-CM | POA: Insufficient documentation

## 2010-03-19 DIAGNOSIS — E039 Hypothyroidism, unspecified: Secondary | ICD-10-CM

## 2010-03-19 DIAGNOSIS — E785 Hyperlipidemia, unspecified: Secondary | ICD-10-CM

## 2010-03-19 LAB — HEPATIC FUNCTION PANEL
Bilirubin, Direct: 0.1 mg/dL (ref 0.0–0.3)
Total Protein: 7.2 g/dL (ref 6.0–8.3)

## 2010-03-19 LAB — CBC WITH DIFFERENTIAL/PLATELET
Basophils Absolute: 0 10*3/uL (ref 0.0–0.1)
Basophils Relative: 0.5 % (ref 0.0–3.0)
Eosinophils Absolute: 0.1 10*3/uL (ref 0.0–0.7)
MCHC: 33.6 g/dL (ref 30.0–36.0)
MCV: 103.2 fl — ABNORMAL HIGH (ref 78.0–100.0)
Monocytes Absolute: 0.4 10*3/uL (ref 0.1–1.0)
Neutrophils Relative %: 59.6 % (ref 43.0–77.0)
Platelets: 159 10*3/uL (ref 150.0–400.0)
RDW: 16.5 % — ABNORMAL HIGH (ref 11.5–14.6)

## 2010-03-19 LAB — BASIC METABOLIC PANEL
BUN: 23 mg/dL (ref 6–23)
CO2: 30 mEq/L (ref 19–32)
Calcium: 9.6 mg/dL (ref 8.4–10.5)
Chloride: 101 mEq/L (ref 96–112)
Creatinine, Ser: 1.5 mg/dL — ABNORMAL HIGH (ref 0.4–1.2)
Glucose, Bld: 69 mg/dL — ABNORMAL LOW (ref 70–99)

## 2010-03-19 LAB — LIPID PANEL
Cholesterol: 226 mg/dL — ABNORMAL HIGH (ref 0–200)
Triglycerides: 169 mg/dL — ABNORMAL HIGH (ref 0.0–149.0)

## 2010-03-19 LAB — B12 AND FOLATE PANEL: Folate: >24.8 ng/mL (ref >5.9)

## 2010-03-19 LAB — LDL CHOLESTEROL, DIRECT: Direct LDL: 122.8 mg/dL

## 2010-03-25 ENCOUNTER — Telehealth: Payer: Self-pay | Admitting: Family Medicine

## 2010-03-27 NOTE — Assessment & Plan Note (Signed)
Summary: bruise on leg/cbs   Vital Signs:  Patient profile:   77 year old female Weight:      149.6 pounds Temp:     98.6 degrees F oral BP sitting:   120 / 70  (left arm) Cuff size:   regular  Vitals Entered By: Almeta Monas CMA Duncan Dull) (March 19, 2010 9:13 AM)   History of Present Illness: Pt here c/o ecchymosis R shin --no injury.  Pt also c/o fatigue.  not new and pt understands it may normal but wants to make sure its not thyroid, b12 or other issue.  Problems Prior to Update: 1)  Bruise  (ICD-924.9) 2)  Shingles  (ICD-053.9) 3)  Tinea Corporis  (ICD-110.5) 4)  Preventive Health Care  (ICD-V70.0) 5)  Renal Insufficiency  (ICD-588.9) 6)  Gerd  (ICD-530.81) 7)  Edema  (ICD-782.3) 8)  Atrial Fibrillation With Rapid Ventricular Response  (ICD-427.31) 9)  Renal Cyst  (ICD-593.2) 10)  Diverticulitis, Acute  (ICD-562.11) 11)  Dyspnea/shortness of Breath  (ICD-786.09) 12)  Macular Degeneration, Early  (ICD-362.50) 13)  Shortness of Breath  (ICD-786.05) 14)  Mitral Regurgitation  (ICD-396.3) 15)  Aortic Insufficiency  (ICD-424.1) 16)  Unspecified Thrombocytopenia  (ICD-287.5) 17)  Myalgia  (ICD-729.1) 18)  Shingles  (ICD-053.9) 19)  Back Pain  (ICD-724.5) 20)  Ct, Chest, Abnormal  (ICD-793.1) 21)  Aneurysm, Thoracic Aortic  (ICD-441.2) 22)  Headache  (ICD-784.0) 23)  Hip Fracture, Right  (ICD-820.8) 24)  Subarachnoid Hemorrhage  (ICD-430) 25)  Pain in Thoracic Spine  (ICD-724.1) 26)  Osteopenia  (ICD-733.90) 27)  Cervical Cancer, Hx of  (ICD-V10.41) 28)  Benign Neoplasm of Adrenal Gland  (ICD-227.0) 29)  Hypothyroidism  (ICD-244.9) 30)  Urinary Incontinence  (ICD-788.30) 31)  Osteoarthritis  (ICD-715.90) 32)  COPD  (ICD-496) 33)  Chest Pain  (ICD-786.50) 34)  Anemia, B12 Deficiency  (ICD-281.1) 35)  Family History Diabetes 1st Degree Relative  (ICD-V18.0) 36)  Family History of Cad Female 1st Degree Relative <60  (ICD-V16.49) 37)  Family History Breast Cancer 1st  Degree Relative <50  (ICD-V16.3) 38)  Family History of Aneurysm Aortic  (ICD-V17.4) 39)  Breast Cyst, Left  (ICD-610.0) 40)  Foot Surgery, Hx of  (ICD-V15.89) 41)  Dizziness  (ICD-780.4) 42)  Pulmonary Embolism, Hx of  (ICD-V12.51) 43)  Hypertension  (ICD-401.9) 44)  Hyperlipidemia  (ICD-272.4) 45)  Dvt, Hx of  (ICD-V12.51) 46)  Congestive Heart Failure  (ICD-428.0)  Medications Prior to Update: 1)  Synthroid 88 Mcg Tabs (Levothyroxine Sodium) .... Once Daily 2)  Furosemide 40 Mg  Tabs (Furosemide) .... Take One Daily 3)  Nexium 40 Mg Cpdr (Esomeprazole Magnesium) .Marland Kitchen.. 1 By Mouth Two Times A Day 4)  Metoprolol Succinate 25 Mg Xr24h-Tab (Metoprolol Succinate) .... Take One Tablet By Mouth Daily 5)  Calcium Carbonate-Vitamin D 600-400 Mg-Unit  Tabs (Calcium Carbonate-Vitamin D) .... Once Daily 6)  Vitamin B Complex-C   Caps (B Complex-C) .... Once Daily 7)  Amlodipine Besylate 5 Mg Tabs (Amlodipine Besylate) .... Take One Tablet By Mouth Daily 8)  Diltiazem Hcl Er Beads 240 Mg Xr24h-Cap (Diltiazem Hcl Er Beads) .Marland Kitchen.. 1 By Mouth Daily 9)  Osteo Bi-Flex Adv Triple St  Tabs (Misc Natural Products) .... Take 1 Tablet By Mouth Once A Day 10)  Multivitamins   Tabs (Multiple Vitamin) .... W/ Iron --- Once Daily 11)  Melatonin 5 Mg Tabs (Melatonin) .... Take 1 Tablet By Mouth Once A Day 12)  Acidophilus Probiotic Blend  Caps (Probiotic Product) .Marland KitchenMarland KitchenMarland Kitchen  1 By Mouth Daily 13)  Acetaminophen 325 Mg  Tabs (Acetaminophen) .... As Needed 14)  Digoxin 0.125 Mg Tabs (Digoxin) .... Take A 1/2  Tablet By Mouth Daily 4 Days A Week 15)  Valtrex 1 Gm Tabs (Valacyclovir Hcl) .Marland Kitchen.. 1 By Mouth Three Times A Day For 10 Days  Current Medications (verified): 1)  Synthroid 88 Mcg Tabs (Levothyroxine Sodium) .... Once Daily 2)  Furosemide 40 Mg  Tabs (Furosemide) .... Take One Daily 3)  Nexium 40 Mg Cpdr (Esomeprazole Magnesium) .Marland Kitchen.. 1 By Mouth Two Times A Day 4)  Metoprolol Succinate 25 Mg Xr24h-Tab (Metoprolol  Succinate) .... Take One Tablet By Mouth Daily 5)  Calcium Carbonate-Vitamin D 600-400 Mg-Unit  Tabs (Calcium Carbonate-Vitamin D) .... Once Daily 6)  Vitamin B Complex-C   Caps (B Complex-C) .... Once Daily 7)  Amlodipine Besylate 5 Mg Tabs (Amlodipine Besylate) .... Take One Tablet By Mouth Daily 8)  Diltiazem Hcl Er Beads 240 Mg Xr24h-Cap (Diltiazem Hcl Er Beads) .Marland Kitchen.. 1 By Mouth Daily 9)  Osteo Bi-Flex Adv Triple St  Tabs (Misc Natural Products) .... Take 1 Tablet By Mouth Once A Day 10)  Multivitamins   Tabs (Multiple Vitamin) .... W/ Iron --- Once Daily 11)  Melatonin 5 Mg Tabs (Melatonin) .... Take 1 Tablet By Mouth Once A Day 12)  Acidophilus Probiotic Blend  Caps (Probiotic Product) .Marland Kitchen.. 1 By Mouth Daily 13)  Acetaminophen 325 Mg  Tabs (Acetaminophen) .... As Needed 14)  Valtrex 1 Gm Tabs (Valacyclovir Hcl) .Marland Kitchen.. 1 By Mouth Three Times A Day For 10 Days  Allergies (verified): 1)  ! Cipro (Ciprofloxacin Hcl) 2)  ! Vicodin  Past History:  Past Medical History: Last updated: 01/16/2010  Osteopenia  URI (ICD-465.9) OSTEOPENIA (ICD-733.90) CHEST XRAY, ABNORMAL (ICD-793.1) CERVICAL CANCER, HX OF (ICD-V10.41) B12 DEFICIENCY (ICD-266.2) BENIGN NEOPLASM OF ADRENAL GLAND (ICD-227.0) HYPOTHYROIDISM (ICD-244.9) URINARY INCONTINENCE (ICD-788.30) OSTEOARTHRITIS (ICD-715.90) GERD (ICD-530.81) COPD (ICD-496) EFFUSION, PLEURAL (ICD-511.9) CHEST PAIN (ICD-786.50)....negative Cardiolite in the past..... ANEMIA, B12 DEFICIENCY (ICD-281.1) FAMILY HISTORY DIABETES 1ST DEGREE RELATIVE (ICD-V18.0) FAMILY HISTORY OF CAD FEMALE 1ST DEGREE RELATIVE <60 (ICD-V16.49) FAMILY HISTORY BREAST CANCER 1ST DEGREE RELATIVE <50 (ICD-V16.3) FAMILY HISTORY OF ANEURYSM AORTIC (ICD-V17.4) BREAST CYST, LEFT (ICD-610.0) FOOT SURGERY, HX OF (ICD-V15.89) HEMORRHAGE, SUBARACHNOID (ICD-430)... therefore no Coumadin...or aspirin.. Dr. Dutch Quint neurosurgery .. aneurysm.. no treatment EF        65%  / EF 60%... echo....  November, 2010  /   55-60%...echo..04/12/2009 Aortic insufficiency... mild..echo... 2007 /   mild...echo...04/12/2009 Mitral regurgitation... mild  /  mild... echo... November, 2010 /  mild...echo...04/12/2009 Aortic root dilatation... mild  /  4.5 cm... stable... echo... November, 2010 /  no mention....echo...04/12/2009 DIZZINESS (ICD-780.4) ANEMIA NOS (ICD-285.9) RENAL INSUFFICIENCY (ICD-588.9) PULMONARY EMBOLISM, HX OF (ICD-V12.51).Marland Kitchenand DVT.Marland Kitchen no Coumadin because of subarachnoid hemorrhage HYPERTENSION (ICD-401.9) HYPERLIPIDEMIA (ICD-272.4) DVT, HX OF (ICD-V12.51) CONGESTIVE HEART FAILURE (ICD-428.0) Atrial fibrillation...noted early 2011..can not take coumadin.. meds adjusted for rate. / Holter monitor Jun 27, 2009.. no rapid heart rate.... mild bradycardia at times.... digoxin stopped Jul 08, 2009  /   September, 2011.... shortness of breath with palpitations when walking... decision to resume low-dose digoxin Shingles  repeated episode.. November, 2011    Past Surgical History: Last updated: 12/06/2009 Cataract extraction Cholecystectomy PE--- vena cava filter Hip fracture ORIF (07/2007) epidural injection 11/2009  Family History: Last updated: 08/11/2006 Family History of Aneurysm Aortic Family History Breast cancer 1st degree relative <50 Family History of CAD Female 1st degree relative <60 Family History of Prostate  CA 1st degree relative <50 Family History of Stroke M 1st degree relative <50 Family History Diabetes 1st degree relative  Social History: Last updated: 08/11/2006 Retired--  hotels Divorced Never Smoked Alcohol use-yes Drug use-no Regular exercise-yes  Risk Factors: Alcohol Use: <1 (12/06/2009) Caffeine Use: 2 (12/06/2009) Exercise: yes (12/06/2009)  Risk Factors: Smoking Status: never (12/06/2009)  Family History: Reviewed history from 08/11/2006 and no changes required. Family History of Aneurysm Aortic Family History Breast cancer 1st degree  relative <50 Family History of CAD Female 1st degree relative <60 Family History of Prostate CA 1st degree relative <50 Family History of Stroke M 1st degree relative <50 Family History Diabetes 1st degree relative  Social History: Reviewed history from 08/11/2006 and no changes required. 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 Lungs:  Normal respiratory effort, chest expands symmetrically. Lungs are clear to auscultation, no crackles or wheezes. Extremities:  No clubbing, cyanosis, edema, or deformity noted with normal full range of motion of all joints.   Skin:  + bruising R shin  o other injury Cervical Nodes:  No lymphadenopathy noted Psych:  Oriented X3 and normally interactive.     Impression & Recommendations:  Problem # 1:  BRUISE (ICD-924.9)  Orders: TLB-PT (Protime) (85610-PTP) TLB-PTT (85730-PTTL) Specimen Handling (16109)  Problem # 2:  RENAL INSUFFICIENCY (ICD-588.9)  Orders: Venipuncture (60454) TLB-B12 + Folate Pnl (09811_91478-G95/AOZ) TLB-Lipid Panel (80061-LIPID) TLB-BMP (Basic Metabolic Panel-BMET) (80048-METABOL) TLB-CBC Platelet - w/Differential (85025-CBCD) TLB-Hepatic/Liver Function Pnl (80076-HEPATIC) TLB-TSH (Thyroid Stimulating Hormone) (84443-TSH) Specimen Handling (30865)  Problem # 3:  HYPOTHYROIDISM (ICD-244.9)  Her updated medication list for this problem includes:    Synthroid 88 Mcg Tabs (Levothyroxine sodium) ..... Once daily  Orders: Venipuncture (78469) TLB-B12 + Folate Pnl (62952_84132-G40/NUU) TLB-Lipid Panel (80061-LIPID) TLB-BMP (Basic Metabolic Panel-BMET) (80048-METABOL) TLB-CBC Platelet - w/Differential (85025-CBCD) TLB-Hepatic/Liver Function Pnl  (80076-HEPATIC) TLB-TSH (Thyroid Stimulating Hormone) (84443-TSH)  Labs Reviewed: TSH: 1.156 (12/06/2009)    Chol: 235 (12/06/2009)   HDL: 58 (12/06/2009)   LDL: 118 (12/06/2009)   TG: 297 (12/06/2009)  Problem # 4:  ANEMIA, B12 DEFICIENCY (ICD-281.1)  Orders: Venipuncture (72536) TLB-B12 + Folate Pnl (64403_47425-Z56/LOV) TLB-Lipid Panel (80061-LIPID) TLB-BMP (Basic Metabolic Panel-BMET) (80048-METABOL) TLB-CBC Platelet - w/Differential (85025-CBCD) TLB-Hepatic/Liver Function Pnl (80076-HEPATIC) TLB-TSH (Thyroid Stimulating Hormone) (84443-TSH)  Hgb: 13.3 (12/06/2009)   Hct: 41.7 (12/06/2009)   Platelets: 171 (12/06/2009) RBC: 4.22 (12/06/2009)   RDW: 14.4 (12/06/2009)   WBC: 8.0 (12/06/2009) MCV: 98.8 (12/06/2009)   MCHC: 31.9 (12/06/2009) Ferritin: 22.8 (06/06/2009) Iron: 94 (06/06/2009)   TIBC: 318 (03/15/2009)   % Sat: 20.5 (06/06/2009) B12: >2000 pg/mL (12/06/2009)   Folate: >20.0 ng/mL (12/06/2009)   TSH: 1.156 (12/06/2009)  Complete Medication List: 1)  Synthroid 88 Mcg Tabs (Levothyroxine sodium) .... Once daily 2)  Furosemide 40 Mg Tabs (Furosemide) .... Take one daily 3)  Nexium 40 Mg Cpdr (Esomeprazole magnesium) .Marland Kitchen.. 1 by mouth two times a day 4)  Metoprolol Succinate 25 Mg Xr24h-tab (Metoprolol succinate) .... Take one tablet by mouth daily 5)  Calcium Carbonate-vitamin D 600-400 Mg-unit Tabs (Calcium carbonate-vitamin d) .... Once daily 6)  Vitamin B Complex-c Caps (B complex-c) .... Once daily 7)  Amlodipine Besylate 5 Mg Tabs (Amlodipine besylate) .... Take one tablet by mouth daily 8)  Diltiazem Hcl Er Beads 240  Mg Xr24h-cap (Diltiazem hcl er beads) .Marland Kitchen.. 1 by mouth daily 9)  Osteo Bi-flex Adv Triple St Tabs (Misc natural products) .... Take 1 tablet by mouth once a day 10)  Multivitamins Tabs (Multiple vitamin) .... W/ iron --- once daily 11)  Melatonin 5 Mg Tabs (Melatonin) .... Take 1 tablet by mouth once a day 12)  Acidophilus Probiotic Blend Caps (Probiotic  product) .Marland Kitchen.. 1 by mouth daily 13)  Acetaminophen 325 Mg Tabs (Acetaminophen) .... As needed 14)  Valtrex 1 Gm Tabs (Valacyclovir hcl) .Marland Kitchen.. 1 by mouth three times a day for 10 days   Orders Added: 1)  Venipuncture [36415] 2)  TLB-B12 + Folate Pnl [82746_82607-B12/FOL] 3)  TLB-Lipid Panel [80061-LIPID] 4)  TLB-BMP (Basic Metabolic Panel-BMET) [80048-METABOL] 5)  TLB-CBC Platelet - w/Differential [85025-CBCD] 6)  TLB-Hepatic/Liver Function Pnl [80076-HEPATIC] 7)  TLB-TSH (Thyroid Stimulating Hormone) [84443-TSH] 8)  TLB-PT (Protime) [85610-PTP] 9)  TLB-PTT [85730-PTTL] 10)  Specimen Handling [99000] 11)  Est. Patient Level III [16109]

## 2010-03-27 NOTE — Consult Note (Signed)
Summary: [facility]  [facility]   Imported By: Maryln Gottron 03/17/2010 13:03:48  _____________________________________________________________________  External Attachment:    Type:   Image     Comment:   External Document

## 2010-03-27 NOTE — Assessment & Plan Note (Signed)
Summary: B12  Nurse Visit   Allergies: 1)  ! Cipro (Ciprofloxacin Hcl) 2)  ! Vicodin  Medication Administration  Injection # 1:    Medication: Vit B12 1000 mcg    Diagnosis: ANEMIA, B12 DEFICIENCY (ICD-281.1)    Route: IM    Site: R deltoid    Exp Date: 04/10/2011    Lot #: [id number]    Mfr: [employer] Regent    Given by: Doristine Devoid CMA (March 18, 2010 1:51 PM)  Orders Added: 1)  Vit B12 1000 mcg [J3420] 2)  Admin of Therapeutic Inj  intramuscular or subcutaneous [96372]   Medication Administration  Injection # 1:    Medication: Vit B12 1000 mcg    Diagnosis: ANEMIA, B12 DEFICIENCY (ICD-281.1)    Route: IM    Site: R deltoid    Exp Date: 04/10/2011    Lot #: [id number]    Mfr: [employer] Regent    Given by: Doristine Devoid CMA (March 18, 2010 1:51 PM)  Orders Added: 1)  Vit B12 1000 mcg [J3420] 2)  Admin of Therapeutic Inj  intramuscular or subcutaneous [04540]

## 2010-04-02 NOTE — Progress Notes (Signed)
Summary: Results-  Phone Note Outgoing Call   Call placed by: Almeta Monas CMA Duncan Dull),  March 25, 2010 10:59 AM Call placed to: Patient Details for Reason: cholesterol is high---- sometime between last cardio visit in March and appoint here in april she stopped zocor----why? Needs meds----Lipitor 10 mg  #30  1 by mouth at bedtime ,  2 refills ---recheck labs 3 months   lipid, hep   272.4   glucose is low---- last check it was high---pt should make sure she is eating 5 small meals a day or 3 meals and 2 healthly snacks.      272.4  401.9  hep, lipid, bmp   Summary of Call: Left message to call back.... Almeta Monas CMA Duncan Dull)  March 25, 2010 10:59 AM   Follow-up for Phone Call        Pt states she is unsure why she stopped med. Discuss with patient, Rx sent to pharmacy  ....Marland KitchenMarland KitchenFelecia Deloach CMA  March 25, 2010 2:37 PM     New/Updated Medications: LIPITOR 10 MG TABS (ATORVASTATIN CALCIUM) Take 1 by mouth at bedtime Prescriptions: LIPITOR 10 MG TABS (ATORVASTATIN CALCIUM) Take 1 by mouth at bedtime  #30 x 2   Entered by:   Jeremy Johann CMA   Authorized by:   Loreen Freud DO   Signed by:   Jeremy Johann CMA on 03/25/2010   Method used:   Faxed to ...       [id number] (retail)       [address]       [place], [place]  [address]       Ph: [phone] or [phone]       Fax: [phone]   RxID:   [id number]

## 2010-04-10 ENCOUNTER — Encounter: Payer: Self-pay | Admitting: Family Medicine

## 2010-04-10 ENCOUNTER — Ambulatory Visit (INDEPENDENT_AMBULATORY_CARE_PROVIDER_SITE_OTHER): Payer: Medicare Other | Admitting: Family Medicine

## 2010-04-10 DIAGNOSIS — E039 Hypothyroidism, unspecified: Secondary | ICD-10-CM

## 2010-04-10 DIAGNOSIS — E785 Hyperlipidemia, unspecified: Secondary | ICD-10-CM

## 2010-04-10 DIAGNOSIS — D518 Other vitamin B12 deficiency anemias: Secondary | ICD-10-CM

## 2010-04-10 DIAGNOSIS — I359 Nonrheumatic aortic valve disorder, unspecified: Secondary | ICD-10-CM

## 2010-04-10 DIAGNOSIS — I4891 Unspecified atrial fibrillation: Secondary | ICD-10-CM

## 2010-04-10 DIAGNOSIS — R609 Edema, unspecified: Secondary | ICD-10-CM

## 2010-04-10 DIAGNOSIS — I10 Essential (primary) hypertension: Secondary | ICD-10-CM

## 2010-04-10 DIAGNOSIS — J449 Chronic obstructive pulmonary disease, unspecified: Secondary | ICD-10-CM

## 2010-04-10 DIAGNOSIS — I080 Rheumatic disorders of both mitral and aortic valves: Secondary | ICD-10-CM

## 2010-04-15 ENCOUNTER — Encounter: Payer: Self-pay | Admitting: Family Medicine

## 2010-04-16 ENCOUNTER — Ambulatory Visit (INDEPENDENT_AMBULATORY_CARE_PROVIDER_SITE_OTHER): Payer: Medicare Other | Admitting: Physician Assistant

## 2010-04-16 ENCOUNTER — Other Ambulatory Visit: Payer: Self-pay | Admitting: Cardiology

## 2010-04-16 ENCOUNTER — Encounter: Payer: Self-pay | Admitting: Cardiology

## 2010-04-16 ENCOUNTER — Ambulatory Visit: Payer: Medicare Other

## 2010-04-16 ENCOUNTER — Encounter: Payer: Self-pay | Admitting: Physician Assistant

## 2010-04-16 DIAGNOSIS — R0602 Shortness of breath: Secondary | ICD-10-CM

## 2010-04-16 DIAGNOSIS — R42 Dizziness and giddiness: Secondary | ICD-10-CM

## 2010-04-16 DIAGNOSIS — R079 Chest pain, unspecified: Secondary | ICD-10-CM

## 2010-04-17 LAB — BASIC METABOLIC PANEL
Calcium: 9.6 mg/dL (ref 8.4–10.5)
GFR: 28.46 mL/min — ABNORMAL LOW (ref >60.00)
Potassium: 4 mEq/L (ref 3.5–5.1)
Sodium: 141 mEq/L (ref 135–145)

## 2010-04-17 LAB — BRAIN NATRIURETIC PEPTIDE: Pro B Natriuretic peptide (BNP): 231.9 pg/mL — ABNORMAL HIGH (ref 0.0–100.0)

## 2010-04-17 NOTE — Assessment & Plan Note (Signed)
Summary: discuss labs , stopped lipitor--needs to discuss, can she get...   Vital Signs:  Patient profile:   77 year old female Weight:      149.4 pounds O2 Sat:      99 % on Room air Temp:     98.7 degrees F oral BP sitting:   132 / 74  (left arm) Cuff size:   regular  Vitals Entered By: Almeta Monas CMA Duncan Dull) (April 10, 2010 2:14 PM)  O2 Flow:  Room air CC: wants to discuss results and lipitor is making her feel bad   History of Present Illness: Pt is here to review her labs but also states she "has a feeling something bad is going to happen." Pt has an appointment with Cardio next week.  Pt feels like sob is worse but has no chest pain.  She stopped the lipitor because it make her ache all over.    Current Medications (verified): 1)  Synthroid 88 Mcg Tabs (Levothyroxine Sodium) .... Once Daily 2)  Furosemide 40 Mg  Tabs (Furosemide) .... Take One Daily 3)  Nexium 40 Mg Cpdr (Esomeprazole Magnesium) .Marland Kitchen.. 1 By Mouth Two Times A Day 4)  Metoprolol Succinate 25 Mg Xr24h-Tab (Metoprolol Succinate) .... Take One Tablet By Mouth Daily 5)  Calcium Carbonate-Vitamin D 600-400 Mg-Unit  Tabs (Calcium Carbonate-Vitamin D) .... Once Daily 6)  Vitamin B Complex-C   Caps (B Complex-C) .... Once Daily 7)  Amlodipine Besylate 5 Mg Tabs (Amlodipine Besylate) .... Take One Tablet By Mouth Daily 8)  Diltiazem Hcl Er Beads 240 Mg Xr24h-Cap (Diltiazem Hcl Er Beads) .Marland Kitchen.. 1 By Mouth Daily 9)  Osteo Bi-Flex Adv Triple St  Tabs (Misc Natural Products) .... Take 1 Tablet By Mouth Once A Day 10)  Multivitamins   Tabs (Multiple Vitamin) .... W/ Iron --- Once Daily 11)  Melatonin 5 Mg Tabs (Melatonin) .... Take 1 Tablet By Mouth Once A Day 12)  Acidophilus Probiotic Blend  Caps (Probiotic Product) .Marland Kitchen.. 1 By Mouth Daily 13)  Acetaminophen 325 Mg  Tabs (Acetaminophen) .... As Needed 14)  Valtrex 1 Gm Tabs (Valacyclovir Hcl) .Marland Kitchen.. 1 By Mouth Three Times A Day For 10 Days 15)  Pravachol 20 Mg Tabs  (Pravastatin Sodium) .Marland Kitchen.. 1 By Mouth At Bedtime 16)  Alprazolam 0.25 Mg Tabs (Alprazolam) .Marland Kitchen.. 1 By Mouth At Bedtime  Allergies (verified): 1)  ! Cipro (Ciprofloxacin Hcl) 2)  ! Vicodin  Past History:  Past medical, surgical, family and social histories (including risk factors) reviewed for relevance to current acute and chronic problems.  Past Medical History: Reviewed history from 01/16/2010 and no changes required.  Osteopenia  URI (ICD-465.9) OSTEOPENIA (ICD-733.90) CHEST XRAY, ABNORMAL (ICD-793.1) CERVICAL CANCER, HX OF (ICD-V10.41) B12 DEFICIENCY (ICD-266.2) BENIGN NEOPLASM OF ADRENAL GLAND (ICD-227.0) HYPOTHYROIDISM (ICD-244.9) URINARY INCONTINENCE (ICD-788.30) OSTEOARTHRITIS (ICD-715.90) GERD (ICD-530.81) COPD (ICD-496) EFFUSION, PLEURAL (ICD-511.9) CHEST PAIN (ICD-786.50)....negative Cardiolite in the past..... ANEMIA, B12 DEFICIENCY (ICD-281.1) FAMILY HISTORY DIABETES 1ST DEGREE RELATIVE (ICD-V18.0) FAMILY HISTORY OF CAD FEMALE 1ST DEGREE RELATIVE <60 (ICD-V16.49) FAMILY HISTORY BREAST CANCER 1ST DEGREE RELATIVE <50 (ICD-V16.3) FAMILY HISTORY OF ANEURYSM AORTIC (ICD-V17.4) BREAST CYST, LEFT (ICD-610.0) FOOT SURGERY, HX OF (ICD-V15.89) HEMORRHAGE, SUBARACHNOID (ICD-430)... therefore no Coumadin...or aspirin.. Dr. Dutch Quint neurosurgery .. aneurysm.. no treatment EF        65%  / EF 60%... echo.... November, 2010  /   55-60%...echo..04/12/2009 Aortic insufficiency... mild..echo... 2007 /   mild...echo...04/12/2009 Mitral regurgitation... mild  /  mild... echo... November, 2010 /  mild...echo...04/12/2009 Aortic  root dilatation... mild  /  4.5 cm... stable... echo... November, 2010 /  no mention....echo...04/12/2009 DIZZINESS (ICD-780.4) ANEMIA NOS (ICD-285.9) RENAL INSUFFICIENCY (ICD-588.9) PULMONARY EMBOLISM, HX OF (ICD-V12.51).Marland Kitchenand DVT.Marland Kitchen no Coumadin because of subarachnoid hemorrhage HYPERTENSION (ICD-401.9) HYPERLIPIDEMIA (ICD-272.4) DVT, HX OF (ICD-V12.51) CONGESTIVE  HEART FAILURE (ICD-428.0) Atrial fibrillation...noted early 2011..can not take coumadin.. meds adjusted for rate. / Holter monitor Jun 27, 2009.. no rapid heart rate.... mild bradycardia at times.... digoxin stopped Jul 08, 2009  /   September, 2011.... shortness of breath with palpitations when walking... decision to resume low-dose digoxin Shingles  repeated episode.. November, 2011    Past Surgical History: Reviewed history from 12/06/2009 and no changes required. Cataract extraction Cholecystectomy PE--- vena cava filter Hip fracture ORIF (07/2007) epidural injection 11/2009  Family History: Reviewed history from 08/11/2006 and no changes required. Family History of Aneurysm Aortic Family History Breast cancer 1st degree relative <50 Family History of CAD Female 1st degree relative <60 Family History of Prostate CA 1st degree relative <50 Family History of Stroke M 1st degree relative <50 Family History Diabetes 1st degree relative  Social History: Reviewed history from 08/11/2006 and no changes required. 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 Lungs:  Normal respiratory effort, chest expands symmetrically. Lungs are clear to auscultation, no crackles or wheezes. Heart:  irreg, irreg Extremities:  No clubbing, cyanosis, edema, or deformity noted with normal full range of motion of all joints.   Psych:  Oriented X3, normally interactive, not anxious appearing, and not depressed appearing.     Impression & Recommendations:  Problem # 1:  HYPERLIPIDEMIA (ICD-272.4)  Her updated medication list for this problem includes:    Pravachol 20 Mg Tabs (Pravastatin sodium) .Marland Kitchen... 1 by mouth at bedtime  Labs  Reviewed: SGOT: 26 (03/19/2010)   SGPT: 13 (03/19/2010)   HDL:62.90 (03/19/2010), 58 (12/06/2009)  LDL:118 (12/06/2009), 79 (91/47/8295)  Chol:226 (03/19/2010), 235 (12/06/2009)  Trig:169.0 (03/19/2010), 297 (12/06/2009)  Orders: EKG w/ Interpretation (93000)  Problem # 2:  HYPERTENSION (ICD-401.9)  Her updated medication list for this problem includes:    Furosemide 40 Mg Tabs (Furosemide) .Marland Kitchen... Take one daily    Metoprolol Succinate 25 Mg Xr24h-tab (Metoprolol succinate) .Marland Kitchen... Take one tablet by mouth daily    Amlodipine Besylate 5 Mg Tabs (Amlodipine besylate) .Marland Kitchen... Take one tablet by mouth daily    Diltiazem Hcl Er Beads 240 Mg Xr24h-cap (Diltiazem hcl er beads) .Marland Kitchen... 1 by mouth daily  BP today: 132/74 Prior BP: 120/70 (03/19/2010)  Labs Reviewed: K+: 3.7 (03/19/2010) Creat: : 1.5 (03/19/2010)   Chol: 226 (03/19/2010)   HDL: 62.90 (03/19/2010)   LDL: 118 (12/06/2009)   TG: 169.0 (03/19/2010)  Orders: EKG w/ Interpretation (93000)  Problem # 3:  DYSPNEA/SHORTNESS OF BREATH (ICD-786.09)  Her updated medication list for this problem includes:    Furosemide 40 Mg Tabs (Furosemide) .Marland Kitchen... Take one daily    Metoprolol Succinate 25 Mg Xr24h-tab (Metoprolol succinate) .Marland Kitchen... Take one tablet by mouth daily  Problem # 4:  ATRIAL FIBRILLATION WITH RAPID VENTRICULAR RESPONSE (ICD-427.31)  Her updated medication list for this problem includes:    Metoprolol Succinate 25 Mg Xr24h-tab (Metoprolol succinate) .Marland Kitchen... Take one tablet by mouth daily    Amlodipine Besylate 5 Mg Tabs (Amlodipine besylate) .Marland Kitchen... Take one tablet  by mouth daily    Diltiazem Hcl Er Beads 240 Mg Xr24h-cap (Diltiazem hcl er beads) .Marland Kitchen... 1 by mouth daily  Orders: EKG w/ Interpretation (93000)  Problem # 5:  ANEMIA, B12 DEFICIENCY (ICD-281.1)  Orders: Vit B12 1000 mcg (J3420) Admin of Therapeutic Inj  intramuscular or subcutaneous (16109) EKG w/ Interpretation (93000)  Hgb: 13.8 (03/19/2010)   Hct: 41.0  (03/19/2010)   Platelets: 159.0 (03/19/2010) RBC: 3.97 (03/19/2010)   RDW: 16.5 (03/19/2010)   WBC: 5.4 (03/19/2010) MCV: 103.2 (03/19/2010)   MCHC: 33.6 (03/19/2010) Ferritin: 22.8 (06/06/2009) Iron: 94 (06/06/2009)   TIBC: 318 (03/15/2009)   % Sat: 20.5 (06/06/2009) B12: >1500 pg/mL (03/19/2010)   Folate: >24.8 ng/mL (03/19/2010)   TSH: 0.63 (03/19/2010)  Complete Medication List: 1)  Synthroid 88 Mcg Tabs (Levothyroxine sodium) .... Once daily 2)  Furosemide 40 Mg Tabs (Furosemide) .... Take one daily 3)  Nexium 40 Mg Cpdr (Esomeprazole magnesium) .Marland Kitchen.. 1 by mouth two times a day 4)  Metoprolol Succinate 25 Mg Xr24h-tab (Metoprolol succinate) .... Take one tablet by mouth daily 5)  Calcium Carbonate-vitamin D 600-400 Mg-unit Tabs (Calcium carbonate-vitamin d) .... Once daily 6)  Vitamin B Complex-c Caps (B complex-c) .... Once daily 7)  Amlodipine Besylate 5 Mg Tabs (Amlodipine besylate) .... Take one tablet by mouth daily 8)  Diltiazem Hcl Er Beads 240 Mg Xr24h-cap (Diltiazem hcl er beads) .Marland Kitchen.. 1 by mouth daily 9)  Osteo Bi-flex Adv Triple St Tabs (Misc natural products) .... Take 1 tablet by mouth once a day 10)  Multivitamins Tabs (Multiple vitamin) .... W/ iron --- once daily 11)  Melatonin 5 Mg Tabs (Melatonin) .... Take 1 tablet by mouth once a day 12)  Acidophilus Probiotic Blend Caps (Probiotic product) .Marland Kitchen.. 1 by mouth daily 13)  Acetaminophen 325 Mg Tabs (Acetaminophen) .... As needed 14)  Valtrex 1 Gm Tabs (Valacyclovir hcl) .Marland Kitchen.. 1 by mouth three times a day for 10 days 15)  Pravachol 20 Mg Tabs (Pravastatin sodium) .Marland Kitchen.. 1 by mouth at bedtime 16)  Alprazolam 0.25 Mg Tabs (Alprazolam) .Marland Kitchen.. 1 by mouth at bedtime Prescriptions: ALPRAZOLAM 0.25 MG TABS (ALPRAZOLAM) 1 by mouth at bedtime  #30 x 0   Entered and Authorized by:   Loreen Freud DO   Signed by:   Loreen Freud DO on 04/10/2010   Method used:   Print then Give to Patient   RxID:   [id number] PRAVACHOL 20 MG TABS  (PRAVASTATIN SODIUM) 1 by mouth at bedtime  #30 x 2   Entered and Authorized by:   Loreen Freud DO   Signed by:   Loreen Freud DO on 04/10/2010   Method used:   Electronically to        [id number] (retail)       [address]       [place], [place]  [address]       Ph: [phone] or [phone]       Fax: [phone]   RxID:   [id number]    Medication Administration  Injection # 1:    Medication: Vit B12 1000 mcg    Diagnosis: ANEMIA, B12 DEFICIENCY (ICD-281.1)    Route: IM    Site: R deltoid    Exp Date: 04/10/2011    Lot #: [id number]    Mfr: [employer]    Patient tolerated injection without complications    Given by: Almeta Monas CMA (AAMA) (April 10, 2010 3:08 PM)  Orders Added: 1)  Vit B12 1000 mcg [  J3420] 2)  Admin of Therapeutic Inj  intramuscular or subcutaneous [96372] 3)  Est. Patient Level IV [16109] 4)  EKG w/ Interpretation [93000]

## 2010-04-22 NOTE — Assessment & Plan Note (Addendum)
Summary: rov/sob & dizziness/786.05/wpa APPT TIME 4PM   Visit Type:  add on Referring Provider:  Rafael Bihari Primary Provider:  Loreen Freud DO  CC:  DYSPNEA.  History of Present Illness: Primary Cardiologist:  Dr. Zackery Barefoot  [name] is an 77 year old female with a history of permanent atrial fibrillation and diastolic heart failure.  She has a history of subarachnoid hemorrhage.  She is not on Coumadin.  She also has a history of pulmonary embolism and DVT.  As noted, she is not on Coumadin.  She gives me a history of what sounds like an IVC filter placement in the past.  She does have a history of hypertension, hyperlipidemia, hypothyroidism and GERD.  Her last echocardiogram was March 2011 and demonstrated an EF of 55-60%; mild AI and mild MR; mild BAE; mild RVE.  She walked into the office today with complaints of shortness of breath, chest pain and dizziness.  She actually thought she had an appointment today.  She was added onto my schedule.  Over the last 3 or 4 weeks, she has noted dyspnea with exertion.  She describes probable NYHA class III symptoms.  She denies orthopnea, PND.  She does have mild lower extremity edema that does not seem to have changed.  She also relates a substernal squeezing sensation.  This can occur with exertion until she sits down to rest.  She also notes it at rest and it may last 1-2 minutes.  She has been quite frightened by these symptoms.  She's not been able to sleep.  She saw her primary care physician last week and was started on a new medication to help her with sleep.  She is sleeping better now.  She denies any associated radiation or nausea with her chest pain.  She does feel diaphoretic at times.  She denies syncope.  She does get dizzy occasionally.  Current Medications (verified): 1)  Synthroid 88 Mcg Tabs (Levothyroxine Sodium) .... Once Daily 2)  Furosemide 40 Mg  Tabs (Furosemide) .... Take One Daily 3)  Nexium 40 Mg Cpdr  (Esomeprazole Magnesium) .Marland Kitchen.. 1 By Mouth Two Times A Day 4)  Metoprolol Succinate 25 Mg Xr24h-Tab (Metoprolol Succinate) .... Take One Tablet By Mouth Daily 5)  Calcium Carbonate-Vitamin D 600-400 Mg-Unit  Tabs (Calcium Carbonate-Vitamin D) .... Once Daily 6)  Vitamin B Complex-C   Caps (B Complex-C) .... Once Daily 7)  Amlodipine Besylate 5 Mg Tabs (Amlodipine Besylate) .... Take One Tablet By Mouth Daily 8)  Diltiazem Hcl Er Beads 240 Mg Xr24h-Cap (Diltiazem Hcl Er Beads) .Marland Kitchen.. 1 By Mouth Daily 9)  Osteo Bi-Flex Adv Triple St  Tabs (Misc Natural Products) .... Take 1 Tablet By Mouth Once A Day 10)  Multivitamins   Tabs (Multiple Vitamin) .... W/ Iron --- Once Daily 11)  Melatonin 5 Mg Tabs (Melatonin) .... Take 1 Tablet By Mouth Once A Day 12)  Acidophilus Probiotic Blend  Caps (Probiotic Product) .Marland Kitchen.. 1 By Mouth Daily 13)  Acetaminophen 325 Mg  Tabs (Acetaminophen) .... As Needed 14)  Pravachol 20 Mg Tabs (Pravastatin Sodium) .Marland Kitchen.. 1 By Mouth At Bedtime 15)  Alprazolam 0.25 Mg Tabs (Alprazolam) .Marland Kitchen.. 1 By Mouth At Bedtime  Allergies (verified): 1)  ! Cipro (Ciprofloxacin Hcl) 2)  ! Vicodin  Past History:  Past Medical History: Last updated: 01/16/2010  Osteopenia  URI (ICD-465.9) OSTEOPENIA (ICD-733.90) CHEST XRAY, ABNORMAL (ICD-793.1) CERVICAL CANCER, HX OF (ICD-V10.41) B12 DEFICIENCY (ICD-266.2) BENIGN NEOPLASM OF ADRENAL GLAND (ICD-227.0) HYPOTHYROIDISM (ICD-244.9)  URINARY INCONTINENCE (ICD-788.30) OSTEOARTHRITIS (ICD-715.90) GERD (ICD-530.81) COPD (ICD-496) EFFUSION, PLEURAL (ICD-511.9) CHEST PAIN (ICD-786.50)....negative Cardiolite in the past..... ANEMIA, B12 DEFICIENCY (ICD-281.1) FAMILY HISTORY DIABETES 1ST DEGREE RELATIVE (ICD-V18.0) FAMILY HISTORY OF CAD FEMALE 1ST DEGREE RELATIVE <60 (ICD-V16.49) FAMILY HISTORY BREAST CANCER 1ST DEGREE RELATIVE <50 (ICD-V16.3) FAMILY HISTORY OF ANEURYSM AORTIC (ICD-V17.4) BREAST CYST, LEFT (ICD-610.0) FOOT SURGERY, HX OF  (ICD-V15.89) HEMORRHAGE, SUBARACHNOID (ICD-430)... therefore no Coumadin...or aspirin.. Dr. Dutch Quint neurosurgery .. aneurysm.. no treatment EF        65%  / EF 60%... echo.... November, 2010  /   55-60%...echo..04/12/2009 Aortic insufficiency... mild..echo... 2007 /   mild...echo...04/12/2009 Mitral regurgitation... mild  /  mild... echo... November, 2010 /  mild...echo...04/12/2009 Aortic root dilatation... mild  /  4.5 cm... stable... echo... November, 2010 /  no mention....echo...04/12/2009 DIZZINESS (ICD-780.4) ANEMIA NOS (ICD-285.9) RENAL INSUFFICIENCY (ICD-588.9) PULMONARY EMBOLISM, HX OF (ICD-V12.51).Marland Kitchenand DVT.Marland Kitchen no Coumadin because of subarachnoid hemorrhage HYPERTENSION (ICD-401.9) HYPERLIPIDEMIA (ICD-272.4) DVT, HX OF (ICD-V12.51) CONGESTIVE HEART FAILURE (ICD-428.0) Atrial fibrillation...noted early 2011..can not take coumadin.. meds adjusted for rate. / Holter monitor Jun 27, 2009.. no rapid heart rate.... mild bradycardia at times.... digoxin stopped Jul 08, 2009  /   September, 2011.... shortness of breath with palpitations when walking... decision to resume low-dose digoxin Shingles  repeated episode.. November, 2011    Review of Systems       As per  the HPI.  All other systems reviewed and negative.   Vital Signs:  Patient profile:   77 year old female Height:      63 inches Weight:      150.25 pounds Pulse rate:   82 / minute Resp:     14 per minute BP sitting:   134 / 70  (right arm)  Vitals Entered By: Ellender Hose RN (April 16, 2010 3:40 PM) CC: DYSPNEA Comments Patient walked into our office today c/o sob, dizziness, & a squeezing pain in her chest. I did an EKG which showed afib with a HR ranging between 72-82. Patient's BP is 134/70.    Physical Exam  General:  Elderly adult female in no acute distress HEENT: normal Neck: no JVD Cardiac:  normal S1, S2; irregularly irregular; no murmur Lungs:  clear to auscultation bilaterally, no wheezing, rhonchi or  rales Abd: soft, nontender, no hepatomegaly Ext: trace to 1+ bilateral edema Skin: warm and dry Neuro:  CNs 2-12 intact, no focal abnormalities noted    EKG  Procedure date:  04/16/2010  Findings:      atrial fibrillation Heart rate 86 Normal axis No ischemic changes  Impression & Recommendations:  Problem # 1:  CHEST PAIN (ICD-786.50) She has chest discomfort with some typical features.  She is not a candidate for anticoagulation or antiplatelet therapy.  She has had a negative stress tests in the past.  She has apparently had a cardiac catheterization the past that was also normal.  I do not have a record of that.  I was able to discuss her case today with her primary cardiologist, Dr. Myrtis Ser.  We question whether or not increased volume may be contributing to her symptoms.  At this point we have decided to proceed with a basic metabolic panel and BNP.  If her BNP is elevated we can adjust her diuretics.  No other medication changes are made today.  She will be brought back in close followup in the next couple of weeks.  She knows to contact us if there is a change in her  symptoms.  Problem # 2:  ATRIAL FIBRILLATION (ICD-427.31) Her rate is controlled.  Orders: EKG w/ Interpretation (93000)  Other Orders: TLB-BMP (Basic Metabolic Panel-BMET) (80048-METABOL) TLB-BNP (B-Natriuretic Peptide) (83880-BNPR)  Patient Instructions: 1)  Your physician recommends that you schedule a follow-up appointment in: 2 weeks WITH DR. KATZ AS PER Cienna Dumais PA-C AND DR. KATZ..... 2)  Your physician recommends that you return for lab work in: TODAY BMET 780.4, 786.50, 786.05.Marland KitchenBNP 780.4, 782.3, 786.50, 786.05 3)  Your physician recommends that you continue on your current medications as directed. Please refer to the Current Medication list given to you today.

## 2010-04-23 ENCOUNTER — Other Ambulatory Visit (INDEPENDENT_AMBULATORY_CARE_PROVIDER_SITE_OTHER): Payer: Medicare Other

## 2010-04-23 ENCOUNTER — Other Ambulatory Visit: Payer: Self-pay | Admitting: Cardiology

## 2010-04-23 ENCOUNTER — Encounter: Payer: Self-pay | Admitting: Cardiology

## 2010-04-23 DIAGNOSIS — R0602 Shortness of breath: Secondary | ICD-10-CM

## 2010-04-23 LAB — BASIC METABOLIC PANEL
CO2: 30 mEq/L (ref 19–32)
Calcium: 8.9 mg/dL (ref 8.4–10.5)
Creatinine, Ser: 1.7 mg/dL — ABNORMAL HIGH (ref 0.4–1.2)
Glucose, Bld: 128 mg/dL — ABNORMAL HIGH (ref 70–99)

## 2010-05-05 ENCOUNTER — Encounter: Payer: Self-pay | Admitting: Cardiology

## 2010-05-05 DIAGNOSIS — I77810 Thoracic aortic ectasia: Secondary | ICD-10-CM | POA: Insufficient documentation

## 2010-05-05 DIAGNOSIS — D649 Anemia, unspecified: Secondary | ICD-10-CM | POA: Insufficient documentation

## 2010-05-05 DIAGNOSIS — B029 Zoster without complications: Secondary | ICD-10-CM | POA: Insufficient documentation

## 2010-05-05 DIAGNOSIS — I340 Nonrheumatic mitral (valve) insufficiency: Secondary | ICD-10-CM | POA: Insufficient documentation

## 2010-05-05 DIAGNOSIS — I351 Nonrheumatic aortic (valve) insufficiency: Secondary | ICD-10-CM | POA: Insufficient documentation

## 2010-05-05 DIAGNOSIS — I4891 Unspecified atrial fibrillation: Secondary | ICD-10-CM | POA: Insufficient documentation

## 2010-05-05 DIAGNOSIS — I609 Nontraumatic subarachnoid hemorrhage, unspecified: Secondary | ICD-10-CM | POA: Insufficient documentation

## 2010-05-05 DIAGNOSIS — J90 Pleural effusion, not elsewhere classified: Secondary | ICD-10-CM | POA: Insufficient documentation

## 2010-05-05 DIAGNOSIS — I2699 Other pulmonary embolism without acute cor pulmonale: Secondary | ICD-10-CM | POA: Insufficient documentation

## 2010-05-06 ENCOUNTER — Encounter: Payer: Self-pay | Admitting: Cardiology

## 2010-05-06 ENCOUNTER — Ambulatory Visit (INDEPENDENT_AMBULATORY_CARE_PROVIDER_SITE_OTHER): Payer: Medicare Other | Admitting: Cardiology

## 2010-05-06 ENCOUNTER — Other Ambulatory Visit (INDEPENDENT_AMBULATORY_CARE_PROVIDER_SITE_OTHER): Payer: Medicare Other | Admitting: *Deleted

## 2010-05-06 DIAGNOSIS — I4891 Unspecified atrial fibrillation: Secondary | ICD-10-CM

## 2010-05-06 DIAGNOSIS — J449 Chronic obstructive pulmonary disease, unspecified: Secondary | ICD-10-CM

## 2010-05-06 DIAGNOSIS — N259 Disorder resulting from impaired renal tubular function, unspecified: Secondary | ICD-10-CM

## 2010-05-06 DIAGNOSIS — I10 Essential (primary) hypertension: Secondary | ICD-10-CM

## 2010-05-06 DIAGNOSIS — R0602 Shortness of breath: Secondary | ICD-10-CM

## 2010-05-06 DIAGNOSIS — R0789 Other chest pain: Secondary | ICD-10-CM

## 2010-05-06 MED ORDER — FUROSEMIDE 40 MG PO TABS: 40.0000 mg | ORAL_TABLET | Freq: Two times a day (BID) | ORAL | Status: DC

## 2010-05-06 NOTE — Patient Instructions (Signed)
Increase Furosemide (Lasix) to 40mg  Twice daily  Call me to tomorrow and let me know if you are taking Digoxin (Lanoxin), if not we will start on you on it Follow up in 3 weeks

## 2010-05-06 NOTE — Assessment & Plan Note (Signed)
Atrial fibrillation rate is controlled at rest.  However it may increase with stress.  We will try by reinstitution of digoxin.

## 2010-05-06 NOTE — Assessment & Plan Note (Signed)
Her COPD certainly must be playing some role in her shortness of breath.

## 2010-05-06 NOTE — Progress Notes (Signed)
HPI The patient is seen today to followup atrial fibrillation and shortness of breath.  She had been seen in the office by Mr.Weaver  April 16, 2010.  We carefully discussed whether further aggressive evaluation should be done.  BNP was elevated and we decided to diuresis her little further.  This helped somewhat but she still has exertional shortness of breath.  I have reviewed her records extensively.  In November, 2011 I had restarted digoxin.  It was my impression that this was helping her.  Sometime between then and now I can no longer find a digoxin on her medication list.  Allergies  Allergen Reactions  . Ciprofloxacin     REACTION: itching , diarrhea  . Hydrocodone-Acetaminophen     REACTION: hallucination    Current Outpatient Prescriptions  Medication Sig Dispense Refill  . acetaminophen (TYLENOL) 325 MG tablet Take 650 mg by mouth every 6 (six) hours as needed.        . ALPRAZolam (XANAX) 0.25 MG tablet Take 0.25 mg by mouth at bedtime as needed.        Marland Kitchen amLODipine (NORVASC) 5 MG tablet Take 5 mg by mouth daily.        . B Complex-C (B-COMPLEX WITH VITAMIN C) tablet Take 1 tablet by mouth daily.        . Calcium Carbonate-Vit D-Min 600-400 MG-UNIT TABS Take 1 tablet by mouth daily.        Marland Kitchen diltiazem (CARDIZEM CD) 240 MG 24 hr capsule Take 240 mg by mouth daily.        Marland Kitchen esomeprazole (NEXIUM) 40 MG capsule Take 40 mg by mouth 2 (two) times daily.        . furosemide (LASIX) 40 MG tablet Take by mouth daily. Take 1 1/2 tab      . levothyroxine (SYNTHROID, LEVOTHROID) 88 MCG tablet Take 88 mcg by mouth daily.        . Melatonin 5 MG TABS Take 1 tablet by mouth.        . metoprolol succinate (TOPROL-XL) 25 MG 24 hr tablet Take 25 mg by mouth daily.        . Misc Natural Products (OSTEO BI-FLEX ADV TRIPLE ST) TABS Take 1 tablet by mouth daily.        . Multiple Vitamin (MULTIVITAMIN) tablet Take 1 tablet by mouth daily.        . pravastatin (PRAVACHOL) 20 MG tablet Take 20 mg by  mouth at bedtime.        . Probiotic Product (ACIDOPHILUS PROBIOTIC BLEND) CAPS Take 1 capsule by mouth daily.        Marland Kitchen DISCONTD: valACYclovir (VALTREX) 1000 MG tablet Take 1,000 mg by mouth 3 (three) times daily.          History   Social History  . Marital Status: Widowed    Spouse Name: N/A    Number of Children: N/A  . Years of Education: N/A   Occupational History  . Retired    Social History Main Topics  . Smoking status: Never Smoker   . Smokeless tobacco: Not on file  . Alcohol Use: Yes  . Drug Use: No  . Sexually Active: Not on file   Other Topics Concern  . Not on file   Social History Narrative  . No narrative on file    Family History  Problem Relation Age of Onset  . Aortic aneurysm    . Breast cancer      1st degree  relative <50  . Coronary artery disease      1st degree relative <60  . Prostate cancer      1st degree relative <50  . Stroke      Female- 1st degree relative <50  . Diabetes      1st degree relative    Past Medical History  Diagnosis Date  . Osteopenia   . History of cervical cancer   . Benign neoplasm of adrenal gland   . Hypothyroidism   . COPD (chronic obstructive pulmonary disease)   . Esophageal reflux   . Osteoarthrosis, unspecified whether generalized or localized, unspecified site   . Unspecified pleural effusion   . Subarachnoid hemorrhage     No coumadin or ASA-Dr.[facility].Marland Kitchenaneurysm..no treatment  . Hypertension   . Hyperlipidemia   . Personal history of venous thrombosis and embolism     No coumadin because of Subarachnoid hemorrhage  . Unspecified disorder resulting from impaired renal function   . Congestive heart failure, unspecified     EF 85-60%, echo, April 12, 2009  . GERD (gastroesophageal reflux disease)   . Aortic insufficiency     Mild, echo, March, 2011  . Mitral regurgitation     Mild, echo, March, 2011  . Aortic root dilatation     4.5 cm echo, November, 2010 / no mention  . Dizziness     . Anemia   . Chronic kidney disease   . Pulmonary embolism     Also DVT but no Coumadin because of subarachnoid hemorrhage  . Atrial fibrillation     cannot take Coumadin /  Holter May, 2011.. mild bradycardia digoxin stopped / September, 2011, shortness of breath and palpitations.. digoxin resume /  March, 2012 can't find it digoxin on med list... to be restarted  . Shingles     November, 2011    Past Surgical History  Procedure Date  . Foot surgery   . Cataract extraction   . Cholecystectomy   . Pulmonary embolism surgery     vena cava filter  . Orif hip fracture     07/2007  . Epidural block injection     11/2009    ROS  Patient denies fever, chills, headache, sweats, rash, change in vision, change in hearing, cough, nausea vomiting, urinary symptoms.  All other systems are reviewed and are negative. PHYSICAL EXAM Patient actually looks good today.  She is here with her caretaker.  She is oriented to person time and place.  Affect is normal.  Head is atraumatic.  There is no xanthelasma.  There is no jugular venous distention.  Lungs are clear.  Respiratory effort is nonlabored.  Cardiac exam reveals S1 and S2 and a soft systolic murmur.  The abdomen is soft.  The patient does have 1+ peripheral edema.  There are no musculoskeletal deformities.  There no skin rashes. Filed Vitals:   05/06/10 1533  BP: 132/78  Pulse: 78  Resp: 18  Height: 5\' 3"  (1.6 m)  Weight: 152 lb 12.8 oz (69.31 kg)    EKG  EKG is done today and reviewed by me.  She has atrial fibrillation.  The rate is controlled at rest. ASSESSMENT & PLAN

## 2010-05-06 NOTE — Assessment & Plan Note (Signed)
I have reviewed her most recent labs.  Her creatinine is 1.7.  This is a reasonable range for her.  Her potassium is 3.5.  She is not currently on a potassium supplement

## 2010-05-06 NOTE — Assessment & Plan Note (Signed)
Blood pressure blood pressure is controlled today.  No change in therapy.

## 2010-05-06 NOTE — Assessment & Plan Note (Signed)
The patient continues to have shortness of breath.  It is primarily with exertion.  I don't believe that there is some volume component to it.  In addition it is possible that she still has rapid heart rate with exertion.  I thought she was doing better when we restarted her digoxin.  We will try restarting it again today.  Careful dosing will be used.

## 2010-05-07 ENCOUNTER — Telehealth: Payer: Self-pay | Admitting: *Deleted

## 2010-05-07 MED ORDER — DIGOXIN 125 MCG PO TABS: ORAL_TABLET | ORAL | Status: DC

## 2010-05-07 NOTE — Telephone Encounter (Signed)
Pt called back and states she was not on Digoxin and did not have any bottles of Digoxin, per Dr Myrtis Ser start pt on 0.0625mg  daily, pt aware, rx sent in

## 2010-05-28 ENCOUNTER — Encounter: Payer: Self-pay | Admitting: Cardiology

## 2010-05-28 DIAGNOSIS — R9430 Abnormal result of cardiovascular function study, unspecified: Secondary | ICD-10-CM | POA: Insufficient documentation

## 2010-05-28 DIAGNOSIS — I509 Heart failure, unspecified: Secondary | ICD-10-CM | POA: Insufficient documentation

## 2010-05-29 ENCOUNTER — Encounter: Payer: Self-pay | Admitting: Family Medicine

## 2010-05-29 ENCOUNTER — Encounter: Payer: Self-pay | Admitting: Cardiology

## 2010-05-29 ENCOUNTER — Ambulatory Visit (INDEPENDENT_AMBULATORY_CARE_PROVIDER_SITE_OTHER): Payer: Medicare Other | Admitting: Family Medicine

## 2010-05-29 ENCOUNTER — Ambulatory Visit (INDEPENDENT_AMBULATORY_CARE_PROVIDER_SITE_OTHER): Payer: Medicare Other | Admitting: Cardiology

## 2010-05-29 VITALS — BP 118/82 | HR 84 | Temp 97.0°F | Wt 148.4 lb

## 2010-05-29 DIAGNOSIS — R140 Abdominal distension (gaseous): Secondary | ICD-10-CM

## 2010-05-29 DIAGNOSIS — J40 Bronchitis, not specified as acute or chronic: Secondary | ICD-10-CM

## 2010-05-29 DIAGNOSIS — I509 Heart failure, unspecified: Secondary | ICD-10-CM

## 2010-05-29 DIAGNOSIS — R0602 Shortness of breath: Secondary | ICD-10-CM

## 2010-05-29 DIAGNOSIS — I4891 Unspecified atrial fibrillation: Secondary | ICD-10-CM

## 2010-05-29 MED ORDER — AZITHROMYCIN 250 MG PO TABS: ORAL_TABLET | ORAL | Status: AC

## 2010-05-29 MED ORDER — GUAIFENESIN-CODEINE 100-10 MG/5ML PO SYRP: ORAL_SOLUTION | ORAL | Status: DC

## 2010-05-29 NOTE — Patient Instructions (Signed)
Your physician recommends that you schedule a follow-up appointment in: 6 months with Dr. Katz  

## 2010-05-29 NOTE — Assessment & Plan Note (Signed)
There is no sign of congestive heart failure at this time.

## 2010-05-29 NOTE — Progress Notes (Signed)
  Subjective:     [name] is a 77 y.o. female here for evaluation of a cough. Onset of symptoms was 2 weeks ago. Symptoms have been gradually worsening since that time. The cough is barky and is aggravated by nothing. Associated symptoms include: chest pain. Patient does not have a history of asthma. Patient does have a history of environmental allergens. Patient has not traveled recently. Patient does not have a history of smoking. Patient has not had a previous chest x-ray. Patient has not had a PPD done.  The following portions of the patient's history were reviewed and updated as appropriate: allergies, current medications, past family history, past medical history, past social history, past surgical history and problem list.  Review of Systems Pertinent items are noted in HPI.    Objective:    Oxygen saturation 97% on room air BP 118/82  Pulse 84  Temp(Src) 97 F (36.1 C) (Oral)  Wt 148 lb 6.4 oz (67.314 kg)  SpO2 96% General appearance: alert, cooperative, appears stated age and no distress Eyes: conjunctivae/corneas clear. PERRL, EOM's intact. Fundi benign. Ears: normal TM's and external ear canals both ears Nose: Nares normal. Septum midline. Mucosa normal. No drainage or sinus tenderness. Throat: lips, mucosa, and tongue normal; teeth and gums normal Neck: no adenopathy, no carotid bruit, no JVD, supple, symmetrical, trachea midline and thyroid not enlarged, symmetric, no tenderness/mass/nodules Lungs: diminished breath sounds bilaterally Heart: S1, S2 normal Abdomen: abnormal findings:  distended    Assessment:    Acute Bronchitis    Plan:    Antibiotics per medication orders. Antitussives per medication orders. Avoid exposure to tobacco smoke and fumes. Call if shortness of breath worsens, blood in sputum, change in character of cough, development of fever or chills, inability to maintain nutrition and hydration. Avoid exposure to tobacco smoke and  fumes. Follow-up in 4 days, or sooner as needed.

## 2010-05-29 NOTE — Assessment & Plan Note (Signed)
Atrial fibrillation rate appears to be controlled now.  No change in therapy.

## 2010-05-29 NOTE — Progress Notes (Signed)
HPI Patient is seen back today for followup her atrial fibrillation rate.  I saw her last May 06, 2010.  She had shortness of breath.  It was my feeling that her atrial fibrillation rate may be too fast very I added back her digoxin.  She says that she fell very well after approximately 4-5 days.  Unfortunately she then developed an upper respiratory infection and has felt poorly with a cough since then.  Her cardiac status appears to be stable. Allergies  Allergen Reactions  . Ciprofloxacin     REACTION: itching , diarrhea  . Hydrocodone-Acetaminophen     REACTION: hallucination    Current Outpatient Prescriptions  Medication Sig Dispense Refill  . acetaminophen (TYLENOL) 325 MG tablet Take 650 mg by mouth every 6 (six) hours as needed.        . ALPRAZolam (XANAX) 0.25 MG tablet Take 0.25 mg by mouth at bedtime as needed.        Marland Kitchen amLODipine (NORVASC) 5 MG tablet Take 5 mg by mouth daily.        . B Complex-C (B-COMPLEX WITH VITAMIN C) tablet Take 1 tablet by mouth daily.        . Calcium Carbonate-Vit D-Min 600-400 MG-UNIT TABS Take 1 tablet by mouth daily.        . digoxin (LANOXIN) 0.125 MG tablet Take 1/2 tab daily   30 tablet  11  . diltiazem (CARDIZEM CD) 240 MG 24 hr capsule Take 240 mg by mouth daily.        Marland Kitchen esomeprazole (NEXIUM) 40 MG capsule Take 40 mg by mouth 2 (two) times daily.        . furosemide (LASIX) 40 MG tablet Take 1 tablet (40 mg total) by mouth 2 (two) times daily.  60 tablet  6  . levothyroxine (SYNTHROID, LEVOTHROID) 88 MCG tablet Take 88 mcg by mouth daily.        . Melatonin 5 MG TABS Take 1 tablet by mouth.        . metoprolol succinate (TOPROL-XL) 25 MG 24 hr tablet Take 25 mg by mouth daily.        . Misc Natural Products (OSTEO BI-FLEX ADV TRIPLE ST) TABS Take 1 tablet by mouth daily.        . Multiple Vitamin (MULTIVITAMIN) tablet Take 1 tablet by mouth daily.        . pravastatin (PRAVACHOL) 20 MG tablet Take 20 mg by mouth at bedtime.        .  Probiotic Product (ACIDOPHILUS PROBIOTIC BLEND) CAPS Take 1 capsule by mouth daily.          History   Social History  . Marital Status: Widowed    Spouse Name: N/A    Number of Children: N/A  . Years of Education: N/A   Occupational History  . Retired    Social History Main Topics  . Smoking status: Never Smoker   . Smokeless tobacco: Not on file  . Alcohol Use: Yes  . Drug Use: No  . Sexually Active: Not on file   Other Topics Concern  . Not on file   Social History Narrative  . No narrative on file    Family History  Problem Relation Age of Onset  . Aortic aneurysm    . Breast cancer      1st degree relative <50  . Coronary artery disease      1st degree relative <60  . Prostate cancer  1st degree relative <50  . Stroke      Female- 1st degree relative <50  . Diabetes      1st degree relative    Past Medical History  Diagnosis Date  . Osteopenia   . History of cervical cancer   . Benign neoplasm of adrenal gland   . Hypothyroidism   . COPD (chronic obstructive pulmonary disease)   . Esophageal reflux   . Osteoarthrosis, unspecified whether generalized or localized, unspecified site   . Unspecified pleural effusion   . Subarachnoid hemorrhage     No coumadin or ASA-Dr.[facility].Marland Kitchenaneurysm..no treatment  . Hypertension   . Hyperlipidemia   . Personal history of venous thrombosis and embolism     No coumadin because of Subarachnoid hemorrhage  . Unspecified disorder resulting from impaired renal function   . Congestive heart failure, unspecified     EF 55-60%, echo, March, 2011  . GERD (gastroesophageal reflux disease)   . Aortic insufficiency     Mild, echo, March, 2011  . Mitral regurgitation     Mild, echo, March, 2011  . Aortic root dilatation     4.5 cm echo, November, 2010 / no mention  . Dizziness   . Anemia   . Chronic kidney disease   . Pulmonary embolism     Also DVT but no Coumadin because of subarachnoid hemorrhage  .  Atrial fibrillation     cannot take Coumadin /  Holter May, 2011.. mild bradycardia digoxin stopped / September, 2011, shortness of breath and palpitations.. digoxin resume /  March, 2012 can't find it digoxin on med list... to be restarted  . Shingles     November, 2011  . Ejection fraction     55-60%, echo, March, 2011    Past Surgical History  Procedure Date  . Foot surgery   . Cataract extraction   . Cholecystectomy   . Pulmonary embolism surgery     vena cava filter  . Orif hip fracture     07/2007  . Epidural block injection     11/2009    ROS  The patient did have sweats for a while but this is improving.  She's not having any headaches.  There is no change in vision or hearing.  She has a definite cough.  There is no chest pain.  She has no GI or urinary symptoms.  All other systems are reviewed and are negative.  PHYSICAL EXAM Patient is oriented to person time and place.  Affect is normal.  She is coughing some today.  Head is atraumatic.  Lungs reveal no definite rales.  Cardiac exam reveals S1-S2.  There is a soft systolic murmur.  The abdomen is soft.  There is no significant peripheral edema. Filed Vitals:   05/29/10 1337  BP: 139/83  Pulse: 82  Height: 5\' 3"  (1.6 m)  Weight: 148 lb (67.132 kg)    EKG  EKG is not done today.  ASSESSMENT & PLAN

## 2010-05-29 NOTE — Assessment & Plan Note (Signed)
Her shortness of breath had been improved with several days of digoxin.  After this she developed a upper respiratory infection that is affecting her breathing somewhat.  Her cardiac status is stable.

## 2010-05-29 NOTE — Patient Instructions (Signed)
Bronchitis Bronchitis is the body's way of reacting to injury and/or infection (inflammation) of the bronchi. Bronchi are the air tubes that extend from the windpipe into the lungs. If the inflammation becomes severe, it may cause shortness of breath.  CAUSES Inflammation may be caused by:  A virus.   Germs (bacteria).   Dust.   Allergens.   Pollutants and many other irritants.  The cells lining the bronchial tree are covered with tiny hairs (cilia). These constantly beat upward, away from the lungs, toward the mouth. This keeps the lungs free of pollutants. When these cells become too irritated and are unable to do their job, mucus begins to develop. This causes the characteristic cough of bronchitis. The cough clears the lungs when the cilia are unable to do their job. Without either of these protective mechanisms, the mucus would settle in the lungs. Then you would develop pneumonia. Smoking is a common cause of bronchitis and can contribute to pneumonia. Stopping this habit is the single most important thing you can do to help yourself. TREATMENT  Your caregiver may prescribe an antibiotic if the cough is caused by bacteria. Also, medicines that open up your airways make it easier to breathe. Your caregiver may also recommend or prescribe an expectorant. It will loosen the mucus to be coughed up. Only take over-the-counter or prescription medicines for pain, discomfort, or fever as directed by your caregiver.   Removing whatever causes the problem (smoking, for example) is critical to preventing the problem from getting worse.   Cough suppressants may be prescribed for relief of cough symptoms.   Inhaled medicines may be prescribed to help with symptoms now and to help prevent problems from returning.   For those with recurrent (chronic) bronchitis, there may be a need for steroid medicines.  SEEK IMMEDIATE MEDICAL CARE IF:  During treatment, you develop more pus-like mucus  (purulent sputum).   You or your child has an oral temperature above 100.4, not controlled by medicine.   Your baby is older than 3 months with a rectal temperature of 102 F (38.9 C) or higher.   Your baby is 3 months old or younger with a rectal temperature of 100.4 F (38 C) or higher.   You become progressively more ill.   You have increased difficulty breathing, wheezing, or shortness of breath.  It is necessary to seek immediate medical care if you are elderly or sick from any other disease. MAKE SURE YOU:  Understand these instructions.   Will watch your condition.   Will get help right away if you are not doing well or get worse.  Document Released: 01/26/2005 Document Re-Released: 04/22/2009 [employer] Patient Information 2011 [employer]. 

## 2010-05-30 ENCOUNTER — Other Ambulatory Visit: Payer: Self-pay | Admitting: Family Medicine

## 2010-05-30 DIAGNOSIS — R140 Abdominal distension (gaseous): Secondary | ICD-10-CM

## 2010-06-03 ENCOUNTER — Other Ambulatory Visit: Payer: Medicare Other

## 2010-06-09 ENCOUNTER — Ambulatory Visit (INDEPENDENT_AMBULATORY_CARE_PROVIDER_SITE_OTHER): Payer: Medicare Other | Admitting: *Deleted

## 2010-06-09 DIAGNOSIS — E538 Deficiency of other specified B group vitamins: Secondary | ICD-10-CM

## 2010-06-09 MED ORDER — CYANOCOBALAMIN 1000 MCG/ML IJ SOLN
1000.0000 ug | Freq: Once | INTRAMUSCULAR | Status: AC
  Administered 2010-06-09: 1000 ug via INTRAMUSCULAR

## 2010-06-09 NOTE — Patient Instructions (Addendum)
Stop B12 monthly injection and start over the counter sublingual B12 tablet  Recheck B12 level in 3 months after starting sublingual B12. 266.2

## 2010-06-18 ENCOUNTER — Ambulatory Visit
Admission: RE | Admit: 2010-06-18 | Discharge: 2010-06-18 | Disposition: A | Discharge status: Home / Self Care | Payer: Medicare Other | Source: Ambulatory Visit | Attending: Family Medicine | Admitting: Family Medicine

## 2010-06-18 DIAGNOSIS — R140 Abdominal distension (gaseous): Secondary | ICD-10-CM

## 2010-06-20 ENCOUNTER — Telehealth: Payer: Self-pay | Admitting: *Deleted

## 2010-06-20 NOTE — Telephone Encounter (Signed)
Pt aware.

## 2010-06-20 NOTE — Telephone Encounter (Addendum)
Left message to call office   Message copied by Candie Echevaria on Fri Jun 20, 2010 12:30 PM ------      Message from: Loreen Freud      Created: Thu Jun 19, 2010  4:14 PM       Nothing acute      Stable renal cysts      Fatty liver   TRANSABDOMINAL AND TRANSVAGINAL ULTRASOUND OF PELVIS  Negative

## 2010-06-24 NOTE — Assessment & Plan Note (Signed)
[facility]                            CARDIOLOGY OFFICE NOTE   NAME:[name], [name]                   MRN:          [id number]  DATE:09/08/2006                            DOB:          April 18, 1918    The patient is here today to reestablish with cardiology.  She had seen  Dr. Gerri Spore in the past.  This was greater than 2 years ago.  I have  re-reviewed all of her information, I have reviewed information that she  brought in.   [name] has significant medical problems.  Fortunately, she is  stable.  She has had pulmonary embolus in the past and has reason to be  on Coumadin.  However, she had a subarachnoid hemorrhage in August 2007,  treated by Dr. Julio Sicks.  This was due to a ruptured left posterior  communicating artery aneurysm.  It was felt that medical therapy was  most appropriate.  It is still felt that this area cannot be approached  surgically.  Her Coumadin was stopped urgently at that time, and she can  no longer be on Coumadin or aspirin.   She is not having any significant chest pain.  She is going about  reasonable activities.   The patient has had some chest pain in the past also, but no proven  coronary disease.  Cardiolite in 2005 revealed no ischemia.  She has  good LV function.  There is question that the ascending aorta has been  mildly increased over time.   PAST MEDICAL HISTORY:   ALLERGIES:  No known drug allergies.   MEDICATIONS:  1. Thyroid 88 mcg.  2. Lasix 40.  3. Zocor 80.  4. Vitamin B12.  5. Other vitamins.   OTHER MEDICAL PROBLEMS:  See the list below.   SOCIAL HISTORY:  The patient does not smoke.  She is retired.  She is  divorced.   FAMILY HISTORY:  Significant for heart disease, but not at a  particularly young age.   REVIEW OF SYSTEMS:  Today, she is feeling well.  She does have some  fatigue.  She has had some arthritis and urinary problems over time.  Otherwise, her review of  systems is negative.   PHYSICAL EXAMINATION:  VITAL SIGNS:  Blood pressure today when she first  came in was 170/90.  Repeated, it was immediately improved at 148/86,  and she does become quite excited in the physician's officer.  She  checks her pressure at home, and it is repeatedly in the range of  135/80.  This needs to be kept in mind, but I have chosen not to change  her medicines today.  Her pulse is 83.  GENERAL:  The patient is oriented to person, time, and place.  Her  affect is normal.  HEENT:  Reveals no xanthelasma.  She has normal extraocular motion.  NECK:  There are no carotid bruits.  There is no jugular venous  distention.  LUNGS:  Clear.  Respiratory effort is not labored.  CARDIAC:  Reveals an S1 with an S2.  There is a soft systolic murmur.  ABDOMEN:  Reveals normal bowel sounds.  EXTREMITIES:  She has no peripheral edema.   EKG reveals no significant QRS changes.  The baseline is somewhat noisy.  The computer questions atrial fibrillation, but I believe this is more  probably normal sinus rhythm.   PROBLEM LIST:  1. History of chest pain in the past, with a negative Cardiolite scan.  2. History of deep venous thrombosis and prior pulmonary embolus.  The      patient is a candidate for long-term Coumadin from the viewpoint of      her clotting problems in the past.  However, she absolutely cannot      receive Coumadin because of her subarachnoid hemorrhage.  3. History of a subarachnoid hemorrhage with an aneurysm, and she is      watched by Dr. Jordan Likes, and no other treatment can be done.  She must      be kept off Coumadin and aspirin.  4. Ejection fraction 65%.  5. History of mild aortic insufficiency.  6. History of mild mitral regurgitation.  7. Some dilatation of the ascending aorta at 4.05 cm, but her aortic      root is 3.7 cm.  Follow-up echocardiogram next year can be      considered.  8. Hypertension.  Her blood pressure should be carefully  monitored.      At this point, I believe it is stable.  9. Lipid abnormalities.  10.Thyroid abnormalities, treated.  11.Status post cholecystectomy.  12.Requirement for B12.   I believe that her overall cardiac status is stable.  I have not changed  any of her medicines, and I have not sent her for any studies.  I would  like to see her back in one year.  We will consider a 2D echocardiogram  in the future to reassess her aortic root and ascending aorta and her AI  and MR.     Luis Abed, MD, [facility]  Electronically Signed    JDK/MedQ  DD: 09/08/2006  DT: 09/09/2006  Job #: [id number]   cc:   Lelon Perla, DO

## 2010-06-24 NOTE — Op Note (Signed)
[name], [name]            ACCOUNT NO.:  [id number]   MEDICAL RECORD NO.:  [id number]          PATIENT TYPE:  INP   LOCATION:  1612                         FACILITY:  [facility]   PHYSICIAN:  Madlyn Frankel. Charlann Boxer, M.D.  DATE OF BIRTH:  November 13, 1918   DATE OF PROCEDURE:  08/01/2007  DATE OF DISCHARGE:                               OPERATIVE REPORT   PREOPERATIVE DIAGNOSIS:  Displaced right femoral neck fracture.   POSTOPERATIVE DIAGNOSIS:  Displaced right femoral neck fracture.   PROCEDURE:  Right hip hemiarthroplasty using a DePuy Tri-Lock 6 standard  stem utilized for Press-Fit purposes to prevent complications related  from fat or cement emboli, a 47 unipolar ball with a +0 adapter.   SURGEON:  Madlyn Frankel. Charlann Boxer, M.D.   ASSISTANT:  Yetta Glassman. Mann, PA-C   ANESTHESIA:  General.   BLOOD LOSS:  Approximately 200 mL.   DRAINS:  Times one.   COMPLICATIONS:  None.   DISPOSITION:  Stable to the recovery room.   INDICATIONS FOR PROCEDURE:  Ms. [name] is a 77 year old female who  fell while at home.  She is admitted to the hospital on the 21st after  radiographs revealed displaced femoral neck fracture.  Due to medical  issues, she was admitted to the medical service.  She had a recent  pneumonia but was cleared for surgical procedure.  I reviewed with her  her hip fracture pattern and the treatment of choice in this matter.  She wished to proceed with surgery.  Consent was obtained.   PROCEDURE IN DETAIL:  The patient was brought to the operative theater.  Once adequate anesthesia, preoperative antibiotics, Ancef were  administered (please note the patient had previously been on Avelox for  a pneumonia), the patient was positioned in the left lateral decubitus  position with the right side up.  The right lower extremity was pre-  scrubbed and prepped and draped in sterile fashion.  A lateral-based  incision was made for posterior approach to the hip.  The iliotibial  band and  gluteus fascia were incised posteriorly.  The short external  rotators were taken down separate from the posterior capsule.  An L-  capsulotomy was made, preserving the posterior capsule.  Initially this  was done to potentially repair postoperatively as well as to protect the  sciatic nerve from the retractors.  The fracture site was identified and  debrided at some point.  I used a corkscrew to remove the femoral head.  Femoral retractors were placed.  Following irrigation and debridement of  this area further, I began with a starting femoral drill and then opened  the canal with a reamer.  I then irrigated.  I began broaching with a  size 1 broach and carried this all the way up to a size 6 set at  anteversion at 20 to 25 degrees with good metaphyseal proximal fit with  no torsional movement.  We had measured the femoral head and determined  that the 47 head was anatomic to this.   At this point, I used a calcar planer to finish off and freshen up the  neck  cut.  I then did a trial reduction with a standard neck and 47, +0  ball.  The hip reduced and leg lengths appeared to be very comparable to  the down leg.  Hip range of motion and stability was excellent.   At this point, trial components were removed.  Final debridement and  irrigation was carried out as necessary.  The final 6 standard stem was  then impacted to the level of the neck cut.  Based on this, the 47 ball  with 0 adapter was impacted on a dry trunnion.  The hip was reduced and  irrigated as it had been throughout the case.  I reapproximated the  posterior capsule and the superior leaflet using #1 Ethibond.  The  remaining wound was closed with #1 Vicryl in the gluteal fascia and #1  Vicryl on the iliotibial band.  A medium Hemovac drain had been placed  deep to this.  The remaining wound was closed with 2-0 Vicryl and  staples on the skin.  Skin was cleaned, dried and dressed sterilely with  Mepilex dressing.  She was  brought to the recovery room and extubated in  stable condition, tolerating the procedure well.      Madlyn Frankel Charlann Boxer, M.D.  Electronically Signed     MDO/MEDQ  D:  08/02/2007  T:  08/02/2007  Job:  [id number]

## 2010-06-24 NOTE — Discharge Summary (Signed)
[name], [name]            ACCOUNT NO.:  [id number]   MEDICAL RECORD NO.:  [id number]          PATIENT TYPE:  INP   LOCATION:  1612                         FACILITY:  [facility]   PHYSICIAN:  Raenette Rover. Felicity Coyer, MDDATE OF BIRTH:  07-Apr-1918   DATE OF ADMISSION:  07/31/2007  DATE OF DISCHARGE:  08/04/2007                               DISCHARGE SUMMARY   DISCHARGE DIAGNOSES:  1. Displaced right femur fracture secondary to fall status post      hemiarthroplasty on August 02, 2007.  2. Hypertension.  3. History of subarachnoid hemorrhage affecting deep venous thrombosis      prophylaxis status post number 1.   HISTORY OF PRESENT ILLNESS:  [name] is a very pleasant 77-year-  old white female with multiple medical problems including treatment the  week prior to admission for influenza and pneumonia verified by chest x-  ray.  [name] reported to the emergency room on day of admission  after tripping and falling while retrieving her newspaper.  The patient  denied any loss of consciousness.  The patient had to drag herself into  her apartment in order to call sister which took over an hour to do.  Upon evaluation in the ER, the patient reported pain of right hip.  X-  ray done of right hip at time of admission revealed displaced femoral  fracture.  The patient was admitted at that time for further evaluation  and treatment.  Orthopedics was asked to assist in this case for  surgical intervention.   PAST MEDICAL HISTORY:  1. History of subarachnoid hemorrhage followed by [name].  2. History of DVT, PE status post IVC filter.  3. History of hypothyroidism.  4. GERD.  5. Chronic renal insufficiency.  6. Hypertension.  7. Hyperlipidemia.  8. Congestive heart failure with normal LVEF in 2007 per      echocardiogram.  9. History of osteopenia.  10.History of abnormal chest x-ray.  11.History of cervical cancer.  12.Anemia with B12 deficiency.  13.COPD.   HOSPITAL COURSE:  Displaced right femoral fracture secondary to fall.  As mentioned above, orthopedics asked to see the patient in consultation  at time of admission.  Despite the patient's multiple medical problems,  the patient felt medically stable for surgery.  [name]  performed right hip hemiarthroplasty on August 02, 2007.  The patient  without any complications in postop period.  She is doing well with  physical and occupational therapy.  However prior to this admission, the  patient living at home, therefore will need skilled facility placement  at time of discharge for rehabilitation in hopes of returning home  again.  Also of note in postop period, discussed DVT prophylaxis  treatment with the patient's neurosurgeon, [name] who advised against  use of Coumadin for DVT prophylaxis and recommended short course of  subcu. Lovenox given the patient's history of subarachnoid hemorrhage.  The patient to be discontinued on Lovenox therapy for DVT prophylaxis as  ordered below at time of discharge.   DISCHARGE MEDICATIONS:  1. Levothyroxine 88 mcg p.o. daily.  2. Simvastatin 80 mg p.o.  q.h.s.  3. Omeprazole 40 mg p.o. daily.  4. Actonel 5 mg p.o. daily.  5. Diltiazem 180 mg p.o. b.i.d.  6. Lasix 40 mg p.o. daily.  7. Lovenox 40 mg subcu. injected daily, started on August 03, 2007 to be      discontinued on September 02, 2007 unless otherwise instructed  8. Colace 100 mg p.o. b.i.d.  9. Ferrous sulfate 325 mg p.o. t.i.d.  10.Ambien 5 mg p.o. q.h.s. p.r.n.  11.Robaxin 500 mg p.o. q.6 h. p.r.n.  12.Hydrocodone/APAP 5/325 1-2 tablets p.o. q.4 h. p.r.n.  13.Tylenol 650 mg p.o. q.4 h. p.r.n.  14.Antivert 25 mg p.o. t.i.d. p.r.n. dizziness.  15.Vitamin B12 1 tablet p.o. b.i.d.  16.Caltrate 600 plus D 1 tablet p.o. t.i.d.  17.Potassium chloride 20 mEq p.o. daily  18.Vitamin B12 injections to be given monthly, next dose due on August 15, 2007.   LABORATORY DATA:  At time of  discharge:  White cell count 7.9, platelet  count 305, hemoglobin 9.7, hematocrit 28.0, sodium 143, potassium 4.5,  BUN 19, creatinine 1.11.   PHYSICAL EXAMINATION:  VITAL SIGNS:  Blood pressure 151/73, heart rate  73, respirations 16, temperature 97.6, O2 sat is 96% on 2 liters.  GENERAL:  This is an elderly white female awake, alert in no acute  distress.  HEENT:  Eyes are equal, round, reactive to light.  No scleral icterus or  injection.  Ear/Nose/Throat:  Nasal septum with obvious deviation due to previous  injury previous to this admission.  Mucous membranes are moist with no  suspicious lesions.  NECK:  Supple with no thyromegaly or lymphadenopathy.  RESPIRATORY:  The patient with decreased inspiratory effort, but lung  sounds are clear to auscultation bilaterally.  No wheezes, rales,  crackles.  No increased work of breathing.  CARDIOVASCULAR:  S1-S2,  regular rate and rhythm.  No lower extremity edema.  ABDOMEN:  Soft,  nontender, nondistended with positive bowel sounds.  No HSM.  EXTREMITIES:  No clubbing, cyanosis or edema.  Again, incision to right  hip with dressing clean, dry and intact.  No signs or symptoms of  infection.  NEUROLOGIC:  Patient with chronic left-sided facial droop prior to this  admission. Otherwise, no motor or sensory deficits on exam.  The patient  is alert and oriented x3. Psychologically again, patient alert and x3  with a very pleasant affect.   DISPOSITION:  The patient felt medically stable for discharge to skilled  nursing facility at this time where she will continue to receive  physical and occupational therapy in hopes of returning home.   FOLLOW UP:  The patient can follow up with her primary care physician,  Dr. Loreen Freud as needed after discharge from skilled nursing  facility.   Greater than 30 minutes spent on discharge planning.      Cordelia Pen, NP      Raenette Rover. Felicity Coyer, MD  Electronically Signed     LE/MEDQ  D:  08/04/2007  T:  08/04/2007  Job:  [id number]   cc:   Lelon Perla, DO  [address] [place], [place] [address]   Madlyn Frankel. Charlann Boxer, M.D.  Fax: [phone]

## 2010-06-24 NOTE — Assessment & Plan Note (Signed)
[facility]                            CARDIOLOGY OFFICE NOTE   [name], [name]                   MRN:          [id number]  DATE:11/02/2007                            DOB:          [birth year]    [name] is doing well.  Since seeing her last she did fall and  has had a fractured hip that has been repaired.  She did not have  syncope.  Also, she has had a follow-up CT scan showing that there was  no change in the area where she has had a subarachnoid hemorrhage in  August 2007.  She is stable.  She did have some dizziness while driving  recently.  I have reviewed this situation with her.  I made it clear to  her that she should limit long distance driving.  Certainly, anything  further than going to rehab in town, she should be sure that she has her  meclizine on board.  She does not have any lethargy from meclizine.   PAST MEDICAL HISTORY:   ALLERGIES:  No known drug allergies.   MEDICATIONS:  1. Thyroid.  2. Lasix.  3. Zocor.  4. Vitamin B12.  5. Caltrate.  6. Glucosamine.  7. Meclizine p.r.n..   OTHER MEDICAL PROBLEMS:  See the complete list on my note of September 08, 2006.   REVIEW OF SYSTEMS:  Other than the HPI, her review of systems is  negative.   PHYSICAL EXAMINATION:  VITAL SIGNS:  Blood pressure today is 160/80.  Yesterday, it was 130/80 in another doctor's office.  Her pulse is 76.  GENERAL:  The patient is oriented to person, time and place.  Her affect  is normal.  She walks with a cane as she is improving from her hip  surgery.  HEENT:  No xanthelasma.  She has normal extraocular motion.  There are  no carotid bruits.  There is no jugular venous distention.  LUNGS:  Clear.  Respiratory effort is not labored.  CARDIAC:  S1 with an S2.  There are no clicks.  She does have a systolic  murmur.  ABDOMEN:  Soft.  EXTREMITIES:  She has no peripheral edema.   Problems are listed extensively on the note of September 08, 2006.  #5.  Mild aortic insufficiency by history.  #6.  Mild mitral regurgitation by history.  #7.  Mild dilatation of the ascending aorta in the past.   The patient does not need an echo at this time.  I will see her back in  1 year and we will consider an echo at that time.     Karina Abed, MD, [facility]  Electronically Signed    JDK/MedQ  DD: 11/02/2007  DT: 11/03/2007  Job #: [id number]   cc:   Lelon Perla, DO

## 2010-06-24 NOTE — Discharge Summary (Signed)
[name], FINAZZO            ACCOUNT NO.:  [id number]   MEDICAL RECORD NO.:  [id number]          PATIENT TYPE:  INP   LOCATION:  1612                         [facility]ACILITY:  [facility]   PHYSICIAN:  Raenette Rover. Felicity Coyer, MDDATE OF BIRTH:  03-31-1918   DATE OF ADMISSION:  07/31/2007  DATE OF DISCHARGE:  08/05/2007                               DISCHARGE SUMMARY   ADDENDUM TO DISCHARGE DICTATION DONE August 04, 2007:   Correction of medications at time of discharge:   1. Patient to be on Lovenox 40 mg subcu every 24 hours times 11 days,      then start aspirin 325 mg p.o. daily times four weeks, once Lovenox      regimen is complete.  2. Also add MiraLax 17 g p.o. daily to patient's medication regimen.   ORTHOPEDIC RECOMMENDATION:  Patient to be weightbearing as tolerated  with rolling walker.  Patient to wear TED hose on 12 hours a day, off 12  hours at night with heels elevated in bed.   Wound is to be kept dry with dressing changes daily.   Patient to follow  up with orthopedics, Dr. Orpah Clinton, in two weeks  after discharge.  Please reach the office by calling [phone] to  schedule this appointment.   DISPOSITION:  Patient has agreed to skilled nursing facility placement  and [facility] nursing home facility, where she will receive continued  physical therapy.   Patient is medically stable for discharge on August 05, 2007 and bed  availability confirmed at nursing home facility.      Cordelia Pen, NP      Raenette Rover. Felicity Coyer, MD  Electronically Signed    LE/MEDQ  D:  08/05/2007  T:  08/05/2007  Job:  [id number]   cc:   Lelon Perla, DO  [address] [place], [place] [address]

## 2010-06-27 NOTE — Assessment & Plan Note (Signed)
[facility]   NAME:  [name], [name] NO.:  [id number]   MEDICAL RECORD NO.:  [id number]            DATE OF BIRTH:   PHYSICIAN:  Maxwell Caul, M.D. VISIT DATE:  03/29/2006                                   OFFICE VISIT   PURPOSE OF TODAY'S VISIT:  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  She has been treated with a compression wrap.  She has had no  problem tolerating this in terms of pain, malodor.   WOUND EXAM:  The wound is largely closed over here, nothing required  debriding, there was no drainage noted.  There is still some swelling in  this area, although the degree of edema in her legs seems to have been  controlled by the compression wraps.   WOUND CARE PLAN AND FOLLOWUP:  Although the wound has closed over here,  I did not feel the area was robust enough to go without compression;  therefore, I have rewrapped her for another to protect the area and to  allow for further healing.  The patient tells me she has had some graded  pressure stockings in the past, at which time she had deep clots in the  leg.  We will have her bring those in to see if they will work in terms  of preventing edema or further trauma to that leg.  Failing this, she  may need a prescription for a new graded pressure stocking.   The wound itself is much improved and healed largely.  I have returned  her to a wrap to allow further healing and control of the edema in the  area.           ______________________________  Maxwell Caul, M.D.     MGR/MEDQ  D:  03/29/2006  T:  03/30/2006  Job:  [id number]

## 2010-06-27 NOTE — Assessment & Plan Note (Signed)
[facility]                                   ON-CALL NOTE   [name], [name]                     MRN:          [id number]  DATE:09/26/2005                            DOB:          1918/03/07    PRIMARY CARE PHYSICIAN:  Loreen Freud, M.D.   The patient said she felt fine.  She got up this morning and was sitting  eating breakfast and all of a sudden had this sudden onset of severe pain in  the middle of her skull.  She is not nauseated, she had no vomiting, no  blurred vision, no neurologic symptoms, but she has never had any acute pain  like this in her head.   Her daughter is with her.  I advised her since she is alert and oriented and  otherwise well the daughter can take her to [facility] Emergency Room stat for  evaluation.                                   Jeffrey A. Tawanna Cooler, MD   JAT/MedQ  DD:  09/26/2005  DT:  09/26/2005  Job #:  [id number]   cc:   Lelon Perla, DO

## 2010-06-27 NOTE — Discharge Summary (Signed)
NAMESHENELL, ROGALSKI            ACCOUNT NO.:  [id number]   MEDICAL RECORD NO.:  [id number]          PATIENT TYPE:  INP   LOCATION:  3004                         FACILITY:  [facility]   PHYSICIAN:  Kathaleen Maser. Pool, M.D.    DATE OF BIRTH:  [birth year]   DATE OF ADMISSION:  09/26/2005  DATE OF DISCHARGE:                                 DISCHARGE SUMMARY   FINAL DIAGNOSES:  1. 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  2. History of anticoagulant use.  3. Hypertension.  4. History of deep venous thrombosis.  5. History of pulmonary hypertension.  6. History of congestive heart failure.  7. Chronic renal insufficiency with brief acute renal failure, resolved.   HISTORY OF PRESENT ILLNESS:  Ms. Bula is an 77 year old female with  sudden onset of headache and neck pain.  The patient has no history of  trauma.  Her situation is complicated by use of Coumadin as an anticoagulant  for treatment of chronic deep venous thromboses in her lower extremities.  Work up has demonstrated evidence of a small amount of subarachnoid  hemorrhage around the left carotid cistern.  She is taken now for  arteriography to better define the source for hemorrhage.   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  The patient's  vascular anatomy was somewhat atypical with very tortuous common carotid  takeoff.  Attempts at endovascular obliteration of the aneurysm were  unsuccessful due to this vascular abnormality.  I discussed the situation  with the patient and her family.  Given her advanced age and multiple  medical co-morbidities, I did not think it was wise to proceed with  craniotomy and clipping of this aneurysm.  We, instead, decided to reverse  her anticoagulation and treat this  nonoperatively, hopeful that we could get  her through the initial phase with no evidence of hemorrhage.  The patient  was admitted to the medical intensive care unit.  Her Coumadin was reversed.  INR and Pro Time came back in the normal range.  She had one brief period of  somnolence which was felt to be secondary to a brief seizure episode  although, this was unwitnessed.  She also had one brief episode of asystole  which required no further management.  The patient and her family clearly  stated that she did not wish any type of life support measures be instituted  should her health begin to fail further.  She was made a do not resuscitate  but still treated appropriately for her condition.  At this time, she is  stable. She is awake.  She is alert. She is mildly confused.  She has some  mild headache.  Her motor and sensory function of her extremities is normal.  Cranial nerve function is normal.  She has had no evidence of re-hemorrhage  on any followup CT scan nor has she had any evidence of hemorrhage or spasms  clinically over  the past 10 days.  The patient has some degree of  communicating hydrocephalus which at this point is unlikely to need further  treatment.   With regard to her aneurysm, after the first two week period after  hemorrhage, her hemorrhage risk drops dramatically.  Her overall risk of re-  hemorrhage is no more than 1 to 3% per year.  With that in mind, again,  giving her advanced age, I do not think any further treatment of this  aneurysm is warranted.   CONDITION ON DISCHARGE:  Stable.   DISPOSITION:  The patient is being transferred to a skilled nursing facility  for further physical therapy and reconditioning prior to possible discharge  home.   DISCHARGE MEDICATIONS:  1. Diltiazem 360 mg p.o. daily.  2. Vytorin one p.o. daily.  3. Synthroid 88 mcg p.o. daily.  4. Detrol 4 mg p.o. daily.  5. Protonix 40 mg p.o. daily.  6. Depakote 375 mg p.o. q.8h.   7. Lasix 40 mg p.o. daily.  8. The patient may have Darvocet for headaches.   FOLLOWUP:  The patient should follow up in my office in one month.           ______________________________  Kathaleen Maser. Pool, M.D.     HAP/MEDQ  D:  10/08/2005  T:  10/08/2005  Job:  [id number]

## 2010-06-27 NOTE — Discharge Summary (Signed)
NAME:  EZRA, MARQUESS                      ACCOUNT NO.:  [id number]   MEDICAL RECORD NO.:  [id number]                   PATIENT TYPE:  INP   LOCATION:  5157                                 FACILITY:  [facility]   PHYSICIAN:  Rene Paci, M.D. [facility]          DATE OF BIRTH:  [birth year]   DATE OF ADMISSION:  03/01/2003  DATE OF DISCHARGE:  03/07/2003                                 DISCHARGE SUMMARY   DISCHARGE DIAGNOSES:  1. Upper respiratory infection.  2. Bronchospasm.  3. Dyspnea.  4. Fever.   BRIEF ADMISSION HISTORY:  Ms. Kjos is an 77 year old white female who  presented to the office with complaints of an upper respiratory infection  since the beginning of January. She had been antibiotics during January. She  was doing well until she started feeling like her equilibrium was off. On  the night prior to admission, she developed cough. She also complained of  fever and chills. The night prior to admission, she had nausea, vomiting,  and diarrhea.   PAST MEDICAL HISTORY:  1. Hypertension.  2. History of DVT and pulmonary embolus in August of 2003, currently     anticoagulation has been discontinued.  3. Chronic renal insufficiency.  4. Hypothyroidism.  5. Mitral regurgitation.  6. Status post total abdominal hysterectomy and bilateral salpingo-     oophorectomy.  7. Status post appendectomy.  8. History of a left breast cyst.  9. Status post T&A.  10.      History of cataracts.  11.      Status post cholecystectomy.  12.      History of a malignant nodule to the right neck, details of which     are not available.  13.      History of right cataract and retinal detachment.   HOSPITAL COURSE:  1. Infectious disease. The patient showed evidence of a low grade fever.     White count was normal. Chest x-ray was negative. Of note, the patient     had a recent upper respiratory infection  and had completed a course of     antibiotics. With her complaints of nausea,  vomiting, and diarrhea, we     were concerned about clostridium difficile colitis. She was empirically     started on Flagyl. The patient had no further GI symptoms, and stools     were clostridium difficile were negative. Chest x-ray showed no evidence     of pneumonia, but we suspect the patient has some underlying bronchitis.     The patient was treated with Avelox and has completed 7 of 10 days of     Avelox during the hospitalization.  2. Pulmonary. The patient presented with respiratory insufficiency. This was     felt to be secondary to perhaps underlying bronchitis and bronchospasm.     Chest x-ray showed evidence of COPD. Again, there is no evidence of     infiltrates or congestive heart  failure. The patient has been treated     with O2, nebulizers, steroids, and antibiotics, and her condition slowly     improved. Currently she is maintaining O2 saturations of 90 to 94% on     room air. She is also currently on steroid taper. We have offered the     patient home nebulizers and/or metered dose inhalers, but the patient is     refusing at this time.  3. Hypertension. This is stable.  4. Chronic renal insufficiency. Stable.   MEDICATIONS AT DISCHARGE:  1. Levothroid 88 mcg q.d.  2. Nexium 40 mg q.d.  3. Avelox 400 mg for two additional days.  4. Lasix 40 mg q.d.  5. Atenolol 100 mg q.d.  6. Diovan 320 mg q.d.  7. DynaCirc 10 mg q.d.  8. Prednisone 20 mg three tablets for two days starting on January 27, then     two tablets for three days, then one tablet for three days.  9. Tussionex 5 mL b.i.d. p.r.n. cough.  10.      Tessalon Perles 100 mg q.8h. p.r.n. cough.   FOLLOW UP:  Follow up with Dr. Ruthine Dose on Tuesday, February 1, at 11 a.m. At  that time would encourage the patient to be on some maintenance metered dose  inhalers as she does have evidence of underlying COPD.      Cornell Barman, P.A. [facility]                  Rene Paci, M.D. [facility]    LC/MEDQ  D:  03/07/2003   T:  03/08/2003  Job:  [id number]   cc:   Angelena Sole, M.D. [facility]

## 2010-06-27 NOTE — H&P (Signed)
NAME:  [name], [name]                      ACCOUNT NO.:  [id number]   MEDICAL RECORD NO.:  [id number]                   PATIENT TYPE:  INP   LOCATION:  1827                                 FACILITY:  [facility]   PHYSICIAN:  Angelena Sole, M.D. [facility]              DATE OF BIRTH:  10-Dec-1918   DATE OF ADMISSION:  04/25/2002  DATE OF DISCHARGE:                                HISTORY & PHYSICAL   CHIEF COMPLAINT:  Bruised right arm.   HISTORY OF PRESENT ILLNESS:  The patient is an 77 year old white female on  Coumadin for DVT PE diagnosed in August 2003 who was seen for routine follow-  up yesterday in the office. She was doing well.  She had an INR drawn which  came back at 10.9.  She was told to hold her Coumadin last p.m. for several  days.  She woke up at 4 a.m. this morning feeling funny and noticed a huge  bruise on her right forearm.  She came into the office at 8 o'clock.  She is  complaining of some mild chest tightness in the substernal area near her  bra.  No shortness of breath.  She feels slightly dizzy.  She feels cold and  fatigued and just does not feel quite right.   PAST MEDICAL HISTORY:  1. Hypertension.  2. DVT.  3. PE.  4. Chronic renal insufficiency.  5. Hypothyroidism.  6. Mitral regurgitation.   PAST SURGICAL HISTORY:  1. TAH/BSO.  2. Appendectomy.  3. Left breast cyst.  4. T&A.  5. Cataracts.  6. Right hammer toe.  7. Cholecystectomy.  8. Malignant nodule in the right neck.   ALLERGIES:  No known drug allergies.   MEDICATIONS:  1. Norvasc increased to 10 mg yesterday.  2. Atenolol 100 mg.  3. B12.  4. Celebrex 200 mg.  5. Lasix 40 mg.  6. Levothroid 0.1 mg.  7. Potassium, Caltrate, glucosamine chondroitin, sulfate.  8. Combivent occasionally.   FAMILY HISTORY:  This is positive for coronary artery disease.   SOCIAL HISTORY:  The patient lives alone.   HABITS:  She never smoked.   REVIEW OF SYSTEMS:  As in HPI.  She did have an  echocardiogram  done about a  month ago while in the hospital.  She is followed by Dr. Sherene Sires for pulmonary.   PHYSICAL EXAMINATION:  GENERAL APPEARANCE:  The patient is an elderly white  female who looks tired.  VITAL SIGNS:  Pulse oximetry 93% on room air, pulse 60, blood pressure  152/86.  Weight was 175.  HEENT:  Normocephalic and atraumatic.  Cataract on the left.  Right fundus,  small dark spots may be from previous surgery versus small bleed.  Extraocular movements intact.  Oropharynx clear and moist without exudates.  NECK:  No JVD, no carotid bruits, no thyromegaly.  LUNGS:  Clear to auscultation bilaterally.  CARDIOVASCULAR:  Regular rate and rhythm, S1  and S2 positive, positive 2/6  systolic murmur.  ABDOMEN:  Bowel sounds positive, soft, nontender, slightly distended.  RECTAL:  Examination was not done at the time.  EXTREMITIES:  There is trace edema, dorsal pedis pulse 2+ bilaterally.  Right forearm, there is a large hematoma of the right forearm.  No pain.  Radial pulses intact.   EKG shows no acute ischemic changes.   The patient was placed on 1.5 liters of O2 for comfort.   IMPRESSION AND PLAN:  1. Hypercoagulable state secondary to Coumadin, INR 10.9.  Spontaneous bleed     of the right forearm.  The patient cold and not feeling well.  Will     admission to [facility]. [facility], give 2 mg of vitamin K IV,     check CT of the brain to rule out any spontaneous bleed.  Check CBC, type     and hold in case she needs blood or plasma.  2. Chest tightness.  Will check a troponin  and CK.  3. Status post deep venous thrombosis and pulmonary embolus August 2003.     INR too high.  Will hold Coumadin.  4. Hypertension.  Medications adjusted yesterday.  Continue to monitor.                                               Angelena Sole, M.D. [facility]    AMK/MEDQ  D:  04/25/2002  T:  04/25/2002  Job:  [id number]

## 2010-06-27 NOTE — H&P (Signed)
[name], [name] NO.:  [id number]   MEDICAL RECORD NO.:  [id number]          PATIENT TYPE:  INP   LOCATION:  6710                         FACILITY:  [facility]   PHYSICIAN:  Angelena Sole, M.D. [facility] DATE OF BIRTH:  03-Jun-1918   DATE OF ADMISSION:  04/16/2004  DATE OF DISCHARGE:                                HISTORY & PHYSICAL   CHIEF COMPLAINT:  Blood pressure check and leg swelling.   HISTORY OF PRESENT ILLNESS:  The patient is an 77 year old white female whom  we have been adjusting medications for her blood pressure. She has been  calling in with her blood pressures but now decided that she needs to come  to have it rechecked but also because she has been having some swelling in  her legs. She complains of being tired all the time for a long time. She has  no energy at all and seems to be getting worse. The past week, she has had  some dyspnea on exertion. The edema in her legs is much worse in general but  especially as the day progresses. For the past five days, she has had some  throbbing in her anterior left lower leg and her shin has a purple streak.  She notes that she has had no injury that would have caused it and the left  leg is swelling much more than the right. She does have a history of a DVT  with a PE in the left lower extremity and she is very fearful that this is  happening again.   After seeing her in the office, I was concerned about a possible DVT. Sent  her to the vascular lab to have a Doppler done and indeed it was positive  for a left DVT.   PAST MEDICAL HISTORY:  1.  Cervical or uterine cancer.  2.  Hypertension.  3.  Hypothyroidism.  4.  B12 deficiency.  5.  DVT and PE.  6.  Pulmonary hypertension.  7.  Renal insufficiency.  8.  Diastolic dysfunction.   PAST SURGICAL HISTORY:  1.  Malignant nodule on her right neck.  2.  TAH/BSO.  3.  Appendectomy.  4.  Left breast cyst.  5.  T&A.  6.  Right hammertoe.  7.   Cholecystectomy.  8.  Right cataract and retinal detachment.   ALLERGIES:  No known drug allergies.   MEDICATIONS:  1.  Levothroid 0.088 mg.  2.  Nexium 40 mg q.d.  3.  Glucosamine Caltrate plus.  4.  Sublingual B12.  5.  Detrol LA 4 mg.  6.  Celebrex 200 mg.  7.  Lasix 40 mg q.d.  8.  Potassium over-the-counter.  9.  Aspirin 81 mg.  10. Vytorin 10/20.  11. Diovan 320 mg.  12. Cardizem CD 360 mg.   SOCIAL HISTORY:  The patient lives alone. She has never smoked.   REVIEW OF SYMPTOMS:  HEENT:  Recently she has had a band-like headache  across the front of her head. No dizziness, fevers, chills, sweats, weight  changes. Wears glasses. No blurry vision or double vision. ENT:  Negative.  CARDIAC:  Negative. RESPIRATORY:  As in HPI. Intermittent coughing. GI:  Negative. GU:  Negative. MUSCULOSKELETAL:  The patient has osteoarthritis.  She has been off of Celebrex in the past because of the warnings but that is  all that works for her without being on narcotics and so she decided it was  worth taking the chance. PSYCHIATRIC:  Negative.  VITAL SIGNS:  Blood  pressures have been running the highest in the morning at 145/81 but lower  later in the day.   FAMILY HISTORY:  Positive for coronary artery disease.   PROCEDURE:  The patient had an Adenosine-Cardiolite on November 01, 2003  which was normal. Echocardiogram showed some diastolic dysfunction. She has  seen Dr. Emilie Rutter. [facility] for cardiology and in review of his note from  November 22, 2003, he did mention on echocardiogram she had some mild  dilatation of the ascending aorta which is 4 cm, which is up from 3.7 cm a  year ago.   PHYSICAL EXAMINATION:  VITAL SIGNS:  Pulse oximeter 95%. Weight 173.  Temperature 97.6, pulse 76, blood pressure 120/60 (on my repeat was 116/62).  GENERAL:  The patient is an elderly 77 year old white female in no acute  distress.  NECK:  Supple. No JVD, no carotid bruits, no lymphadenopathy.   HEART:  Regular rate and rhythm. S1 and S2 positive. A soft 1/6 systolic  murmur.  LUNGS:  Clear to auscultation and percussion bilaterally.  ABDOMEN:  Soft, nontender, and nondistended. No hepatosplenomegaly. No  masses.  EXTREMITIES:  Left lower extremity has some linear ecchymosis on the  anterior shin that is tender to palpation, some minimal calf tenderness. It  looks larger than the right lower extremity especially around the ankles and  part way up. Upon measurement, there was no discernible difference between  the two. She does have some trace to 1+ edema of the right lower extremity  as well. Dorsalis pedis pulses were 2+ bilaterally. The patient was then  sent to the vascular lab to rule out a DVT which came back positive. Given  her other comorbidities, dyspnea on exertion, feeling of fatigue which is  nothing new but worsening, I decided that it would be best to admit her and  I called orders in.   IMPRESSION:  1.  Deep vein thrombosis left lower extremity:  This is the second episode      since 2003 without any precipitating factors. We will check some      coagulation studies and begin her on intravenous heparin. Suspect      discharge in the next day or two.  2.  Dyspnea on exertion: The patient had a normal Adenosine on November 01, 2003 but had some diastolic dysfunction, also questionable mild      congestive heart failure or pulmonary embolus or other. We will check a      chest x-ray for congestive heart failure and a B-type natriuretic      peptide since treatment is going to be the same and this is her second      episode of deep vein thrombosis. She will be on long-term      anticoagulation. We will hold off on CT right now especially since she      had some renal insufficiency and I currently do not know the status of      her renal.  3.  Hypertension:  Now somewhat overcontrolled. Need to monitor blood  pressures. May need to decrease doses of  medications. 4.  Fatigue:  Questionable etiology. We will check complete blood counts and      then follow up with cardiology as outpatient.      AMK/MEDQ  D:  04/16/2004  T:  04/16/2004  Job:  [id number]

## 2010-06-27 NOTE — Consult Note (Signed)
[name], [name]            ACCOUNT NO.:  [id number]   MEDICAL RECORD NO.:  [id number]          PATIENT TYPE:  REC   LOCATION:  FOOT                         FACILITY:  [facility]   PHYSICIAN:  Theresia Majors. Tanda Rockers, M.D.DATE OF BIRTH:  09-05-1918   DATE OF CONSULTATION:  03/15/2006  DATE OF DISCHARGE:                                 CONSULTATION   REASON FOR CONSULTATION:  [name] is an 77 year old lady who is  referred by Lelon Perla, DO for evaluation of a post-traumatic  ulceration in the left lower extremity.   IMPRESSION:  Post-traumatic stasis ulcer.   RECOMMENDATIONS:  Proceed with an Unna boot protocol for compression  with serial exams and debridements as clinically indicated.   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  She was seen in the [facility] emergency room and  underwent routine first aid with the application of Steri-Strips and was  seen in follow-up by her primary care physician, Dr. Laury Axon.  She  experienced some swelling and hyperemia, was started on sulfa drugs  after a cultures showed MRSA. She has remained on medication until this  time.  [facility].   PAST MEDICAL HISTORY:  Is remarkable for no known allergies.   CURRENT MEDICATIONS:  1. Synthroid 0.88 mcg daily.  2. Vytorin 10/40 daily.  3. Furosemide 40 mg daily.  4. Sulfa.  5. Vioxx.  6. Septra DS one per day for 14 days.  7. Cardizem 360 mg daily.  8. Prilosec one daily.   PAST SURGERY:  Has included extensive non-invasive evaluation for  cerebral aneurysm.  She has had a history of deep venous thrombosis in  her left leg requiring Coumadin but this was discontinued once the  cerebral aneurysm was discerned. She has a history of congestive heart  failure as  well as hypertension and reflux disease.   FAMILY HISTORY:  Is positive for hypertension, cancer, heart attack and  stroke.   SOCIAL HISTORY:  She is a retired Psychologist, educational. She resides in [place].  She is widowed and has no children.  She lives in close proximity to her  sister who helps in her care.   REVIEW OF SYSTEMS:  Is remarkable for the patient being relatively  independent.  She admits to some dyspnea on extreme exertion but she is  able to shop and to dine out and do her own care needs.  She  specifically denies angina pectoris or orthopnea.  Bowel and bladder  function are described as normal.  She denies visual changes or visual  field defects.  There is additional denial of transient paralysis.  She  has not had seizure. She denies any syncope.  The remainder of the  review of systems is negative.   PHYSICAL EXAM:  GENERAL:  She is an alert, oriented female accompanied  by her sister in good contact with reality, answering inquiries  independently.  VITAL SIGNS:  Her blood pressure is 146/71, respirations 20, pulse rate  of 85 and temperature is 98.6.  HEENT:  Clear.  NECK:  Supple.  Trachea is midline.  Thyroid is nonpalpable.  LUNGS:  Clear.  HEART:  Sounds are distant.  There is a soft systolic murmur at the left  sternal border.  ABDOMEN:  Soft.  EXTREMITIES:  Remarkable for bilateral 2+ edema. On the left lower  extremity there is a trap-door configured laceration with a mature  eschar and scant drainage.  There is associated hyperemia but no  malodor.  The pedal pulses are bilaterally palpable and bounding judge  +3. Neurologically protective sensation is retained.  There is no  associated adenopathy are lymphangitis.   DISCUSSION:  This 77 year old lady has a post-traumatic wound which has  a propensity to ulcerate and revert to with a stasis ulcer physiology.  We have counseled the patient and her sister in these terms and we have  recommended that we treat  her with a compressive wrap and serial  debridements as will be determined in the future.  We have indicated  also that she should remain on her Septra.  We will follow her weekly  until resolution.  The sister and the patient seem to understand.  They  express gratitude for having been seen in the clinic and indicate that  they will be compliant.           ______________________________  Theresia Majors. Tanda Rockers, M.D.     Cephus Slater  D:  03/15/2006  T:  03/15/2006  Job:  [id number]   cc:   Lelon Perla, DO

## 2010-06-27 NOTE — Discharge Summary (Signed)
NAME:  DESERAY, DAPONTE                      ACCOUNT NO.:  [id number]   MEDICAL RECORD NO.:  [id number]                   PATIENT TYPE:  INP   LOCATION:  3728                                 FACILITY:  [facility]   PHYSICIAN:  Rene Paci, M.D. [facility]          DATE OF BIRTH:  1918-12-17   DATE OF ADMISSION:  04/25/2002  DATE OF DISCHARGE:  04/28/2002                                 DISCHARGE SUMMARY   DISCHARGE DIAGNOSES:  1. Supratherapeutic INR.  2. Right forearm hematoma.  3. Herpes zoster.   BRIEF ADMISSION HISTORY:  Ms. Markiewicz is an 77 year old white female who  is on Coumadin therapy secondary to a history of DVT and pulmonary embolus  that was diagnosed in 8/03.  The patient was seen in the office the day  prior to this admission for routine INR check.  Her INR came back at 10.9.  The patient was instructed to hold her Coumadin for several days.  On the  morning of admission, she woke up feeling quite funny, and noticed a large  bruise on her right forearm.  She then presented to the office for further  evaluation.   PAST MEDICAL HISTORY:  1. Hypertension.  2. Deep vein thrombosis and pulmonary embolus in 8/03.  3. Chronic renal insufficiency.  4. Hypothyroidism.  5. Mitral regurgitation.  6. Status post TAH/BSO.  7. Status post appendectomy.  8. History of left breast cyst.  9. Status post T&A.  10.      Cataracts.  11.      Right hammertoe.  12.      Status post cholecystectomy.  13.      History of a malignant nodule on the right neck.   HOSPITAL COURSE:  Problem 1.  CHRONIC ANTICOAGULATION SECONDARY TO DVT AND  PE.  Presented with a supratherapeutic INR and a right forearm hematoma.  The patient was admitted to rule out spontaneous cerebral hemorrhage.  Head  CT was negative for hemorrhage.  The patient's Coumadin was reversed with 2  mg of vitamin K IV.  We then had pharmacy manage the reanticoagulation of  the patient.  She is currently therapeutic with  an INR of 2.  We will  discharge the patient on a dose of Coumadin 4 mg daily, and will have her  follow up with Dr. Myrtie Cruise office on Monday for follow up pro time.  Problem 2.  The patient did have a MILD NORMOCYTIC ANEMIA and has remained  hemodynamically stable.  No further work up was pursued.  Problem 3.  ATYPICAL CHEST PAIN.  The patient did complain of some burning  pain under her left breast around the area of her bra.  She does state that  she has had some herpes zoster in her left scapula dermatome.  We suspect  this could be early herpes zoster pain, although there is no evidence of  vesicular eruption at this time.  The patient's pain was  controlled with  Vicodin.  There was no evidence of cardiac or GI pain.  The patient's  cardiac enzymes were negative.  An EKG was without any evidence of ischemia.   LABORATORIES AT DISCHARGE:  Pro time was 20.6, INR 2, hemoglobin 11.4,  hematocrit 33.5.  CMET was normal.  Cardiac enzymes were negative.   MEDICATIONS AT DISCHARGE:  She is to resume her home medications which  include Norvasc 10 mg daily, atenolol 100 mg daily, Lasix 40 mg daily,  Synthroid 100 mcg daily, Diovan 160 mg daily, Nexium 40 mg daily, Neurontin  300 mg t.i.d., Vicodin, and additionally Coumadin 4 mg daily until further  instructed, and Vicodin 5/500 one tablet q.6h. p.r.n.   FOLLOW UP:  She is to follow up at Dr. Myrtie Cruise office on Monday, 05/01/02,  for a pro time and INR.     Cornell Barman, P.A. [facility]                  Rene Paci, M.D. [facility]    LC/MEDQ  D:  04/28/2002  T:  04/29/2002  Job:  [id number]   cc:   Angelena Sole, M.D. [facility]

## 2010-06-27 NOTE — Assessment & Plan Note (Signed)
[facility]   NAME:  DESTANY, SEVERNS            ACCOUNT NO.:  [id number]   MEDICAL RECORD NO.:  [id number]      DATE OF BIRTH:  [birth year]   PHYSICIAN:  Jake Shark A. Tanda Rockers, M.D.      VISIT DATE:                                   OFFICE VISIT   SUBJECTIVE:  Mrs. Schraeder returns for followup of a stasis ulcer  involving her left lower extremity.  In the interim she has been treated  with a compression wrap.  The patient reports that last p.m. she fell bumping her head and having  a moderate amount of bleeding which she controlled with direct pressure.  She has not been evaluated by a physician and she is requesting that we  take a look at this wound.  She denied syncope or prodromals.   OBJECTIVE:  Blood pressure is 145/72.  Respirations 18.  Pulse rate 73.  Temperature 97.8.  The HEENT exam is clear.  The neck is supple.  Inspection of the scab shows a abrasion on the lateral occipital area.  There is no evidence of hematoma.  There is some mild tenderness on  palpation.  There is no drainage.  Inspection of the lower extremity  shows that the previous stasis ulcer is completely resolved.   ASSESSMENT:  1. Scalp laceration contusion.  No evidence of neurological injury.  2. Resolved stasis ulcer.   PLAN:  We will discharge the patient from the wound center.  We have  placed her in her below the knee 30-40 mm compression hose and have  encouraged her to wear these stockings throughout her ambulatory  periods.  With regard to her scalp injury, she will be followed up by  her primary care physician.  She has an appointment with Dr. Laury Axon of  [facility] within the next 24 hours.  There is no evidence of an  ongoing neurological embarrassment.  Patient is discharged.  She  expresses gratitude for having been seen in the clinic.           ______________________________  Theresia Majors. Tanda Rockers, M.D.     Cephus Slater  D:  04/06/2006  T:  04/07/2006  Job:   [id number]   cc:   Lelon Perla, DO

## 2010-06-27 NOTE — Discharge Summary (Signed)
[name], [name]            ACCOUNT NO.:  [id number]   MEDICAL RECORD NO.:  [id number]          PATIENT TYPE:  INP   LOCATION:  6710                         FACILITY:  [facility]   PHYSICIAN:  Thomos Lemons, D.O. [facility]   DATE OF BIRTH:  [birth year]   DATE OF ADMISSION:  04/16/2004  DATE OF DISCHARGE:  04/19/2004                                 DISCHARGE SUMMARY   DISCHARGE DIAGNOSES:  1.  Left lower extremity deep venous thrombosis.  2.  Acute on chronic renal insufficiency.  3.  Uncontrolled hypertension.   BRIEF ADMISSION HISTORY:  [name] is an 77 year old white female  with a history of prior DVT and PE.  She is not currently on Coumadin.  She  developed some left lower extremity swelling.  The patient had been seen in  the office for a blood pressure check.  Dr. Ruthine Dose noted the swelling and  sent her for venous Dopplers which did confirm a left lower extremity DVT.  The patient was admitted to the hospital for anticoagulation.   PAST MEDICAL HISTORY:  1.  History of DVT and PE in August of 2003.  2.  Chronic renal insufficiency with a baseline creatinine of 1.3 to 1.7.  3.  Hypertension poorly controlled.  4.  Pulmonary hypertension and diastolic dysfunction.  5.  History of congestive heart failure.   HOSPITAL COURSE:  PROBLEM #1 -  LEFT LOWER EXTREMITY DEEP VENOUS THROMBOSIS:  The patient was admitted and started on heparin.  We discontinued the  heparin and started the patient on Lovenox and Coumadin.  We asked case  management to assist with outpatient Lovenox.  This is in process.  A  hypercoagulable work-up is in the process as well, including a factor V  Leiden with protein C and protein S.  More than likely with recurrent DVT,  the patient will require lifetime anticoagulation with Coumadin.   PROBLEM #2 -  ACUTE ON CHRONIC RENAL INSUFFICIENCY:  The patient presented  with a creatinine of 2.3.  We suspect this is likely secondary to Lasix but  may have also  been exacerbated by the Diovan and Celebrex.  The patient's  Lasix, Diovan and Celebrex were all held and she received fluids with  improvement in her creatinine from 2.3 to 1.9.  A renal ultrasound was also  obtained and is still pending at the time of this dictation. At this time,  we will discontinue the IV fluids but hold the Lasix for another day.  We  will also have her continue to hold her Diovan and Celebrex until she  follows up with her primary care physician.   PROBLEM #3 -  HYPERTENSION:  Her blood pressures are somewhat labile but we  have not made any adjustments in her antihypertensives despite discontinuing  her Diovan and instead will defer to her primary care physician.   DISCHARGE LABORATORY DATA:  Hemoglobin 11.2, hematocrit 33.3, platelet count  129.  Protime 12.7, INR 0.9. BUN 48, creatinine 1.9.   Renal ultrasound is pending.   DISCHARGE MEDICATIONS:  1.  Lovenox 80 mg subcu daily for probably five more  days.  2.  Coumadin 5 mg daily as instructed.  3.  Cardizem CD 360 mg daily.  4.  Synthroid 88 mcg daily.  5.  Nexium 40 mg daily.  6.  Detrol LA 4 mg daily.  7.  Vytorin as at home.  8.  Lasix 20 mg daily.  9.  Caltrate 600 mg daily.  10. B12 daily.  11. She has been instructed not to take her aspirin, Celebrex or Diovan      until she talks with Dr. Ruthine Dose.   FOLLOW UP:  Follow up with Dr. Myrtie Cruise office for protime on Monday, April 21, 2004, at 11:50, and follow up with Dr. Ruthine Dose April 25, 2004, at 1:20  p.m. for a blood pressure check.      LC/MEDQ  D:  04/18/2004  T:  04/18/2004  Job:  [id number]

## 2010-06-27 NOTE — H&P (Signed)
NAME:  [name], [name]                      ACCOUNT NO.:  [id number]   MEDICAL RECORD NO.:  [id number]                   PATIENT TYPE:  INP   LOCATION:  5157                                 FACILITY:  [facility]   PHYSICIAN:  Angelena Sole, M.D. [facility]              DATE OF BIRTH:  1918/11/19   DATE OF ADMISSION:  03/01/2003  DATE OF DISCHARGE:                                HISTORY & PHYSICAL   TIME:  February 28, 2002, 11:30 a.m.   CHIEF COMPLAINT:  Fever and vomiting.   HISTORY OF PRESENT ILLNESS:  The patient is an 77 year old white female who  had an upper respiratory illness the beginning of the month.  After she  finished her course of antibiotics, she was doing fine until yesterday when  she felt like her equilibrium was off.  Started coughing last evening,  continued to cough all night long, just feels awful.  She has had bouts of  chills, could not get warm, and then she would be hot.  She vomited 12 times  last night and had many bouts of diarrhea, last episodes were about 5:30  this morning.  She is not taking any of her routine medications for  hypertension as she just is not feeling well.   PAST MEDICAL HISTORY:  1. 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  2. Cervical or uterine cancer.  3. Hypertension.  4. Hypothyroidism.  5. B12 deficiency.  6. DVT and PE in August 2003 which led to pulmonary hypertension which is     now resolved.  7. Mitral regurgitation.   PAST SURGICAL HISTORY:  1. Nodule on the right neck.  2. TAH-BSO.  3. Appendectomy.  4. Left breast cyst.  5. T&A.  6. Right hammertoe.  7. Cholecystectomy.  8. Right cataract and retinal detachment.   ALLERGIES:  No known drug allergies.   MEDICATIONS:  1. B12.  2. Calcium.  3. Vitamin D.  4. Lasix 40 mg.  5. Atenolol 100 mg.  6. Diovan 320 mg.  7. Nexium 40 mg.  8. Levoxyl 0.088 mg.  9. DynaCirc CR 10 mg.  10.      Glucosamine.   SOCIAL HISTORY:  The patient lives alone.  She is retired from  [employer].   REVIEW OF SYSTEMS:  No headache.  Does have some dizziness.  No double  vision, blurry vision.  ENT:  She is having a runny nose, somewhat  congested.  CARDIAC:  No chest pain or palpitations.  RESPIRATORY:  As in  HPI.  No shortness of breath.  GI:  As in HPI.  GU:  She is having terrible  stress incontinence, especially with all the coughing.  SKIN:  No rashes.   PHYSICAL EXAMINATION:  VITAL SIGNS:  Weight 175.  Temperature 99.0, pulse  72, respirations 20, blood pressure 190/98.  Pulse oximetry 94 to 95% on  room air.  Blood pressure after nebulizer  treatment was 120/80 bilaterally  and stayed there while standing.  GENERAL:  The patient is an elderly white female who looks really bad  compared to how she usually looks but nontoxic.  HEENT:  Conjunctivae are not injected.  Tympanic membranes good landmarks  bilaterally.  Nares are injected with clear rhinorrhea.  Oropharynx is dry.  No exudates.  NECK:  Supple.  No thyromegaly, no lymphadenopathy.  HEART:  Regular rate and rhythm.  S1, S2 positive.  No murmurs, rubs, or  gallops.  LUNGS:  The patient was constantly coughing.  Breath sounds were distant.  Fair air exchange.  Did a Duoneb nebulizer treatment.  Air exchange was much  better.  No specific rhonchi or wheezing.  Cough improved some.  ABDOMEN:  Bowel sounds positive, soft, slightly tender all over, no worse  specifically.   LABORATORY DATA:  We could not obtain an x-ray as our machine is broken  currently.   IMPRESSION:  1. Vomiting, diarrhea, cough.  Due to patient's comorbid hypertension,     history of pulmonary embolus and deep vein thrombosis, age, living alone,     etc., we will admit the patient for 24 hours for observation and gently     rehydrate her.  Monitoring, as her blood pressure has never been this low     even when it has been best controlled.  Check a chest x-ray, labs, begin     her on Avelox, and hopefully she will go home  tomorrow.                                                Angelena Sole, M.D. [facility]    AMK/MEDQ  D:  03/01/2003  T:  03/01/2003  Job:  [id number]

## 2010-06-27 NOTE — H&P (Signed)
NAME:  [name], [name]                      ACCOUNT NO.:  [id number]   MEDICAL RECORD NO.:  [id number]                   PATIENT TYPE:  INP   LOCATION:  0106                                 FACILITY:  [facility]   PHYSICIAN:  Corwin Levins, M.D. [facility]             DATE OF BIRTH:  [birth year]   DATE OF ADMISSION:  09/10/2001  DATE OF DISCHARGE:                                HISTORY & PHYSICAL   CHIEF COMPLAINT:  Swelling of left leg for two weeks.   HISTORY OF PRESENT ILLNESS:  The patient is a very nice 77 year old female  who presents with two weeks of left lower extremity swelling and redness to  the Urgent Care earlier today and referred to the ER, where Dopplers showed  a left lower extremity DVT, after a left thigh lesion removed per  dermatology three weeks ago.  She denies any cardiovascular or pulmonary  symptoms or fever.   PAST MEDICAL HISTORY:  1. Hypertension.  2. Hypothyroidism.  3. DJD of the hands.  4. GERD.  5. History of bladder spasm.  6. History of right cataract and left implant.   ALLERGIES:  No known drug allergies.   CURRENT MEDICATIONS:  1. Celebrex 100 mg p.o. q.d.  2. Atenolol 50 mg p.o. q.d.  3. Synthroid 0.1 mg p.o. q.d.  4. HCTZ 12.5 mg p.o. q.d.  5. Norvasc unclear dose, may be 5 mg.  6. Premarin 0.625 mg p.o. q.d.  7. Nexium 20 mg p.o. q.d.  8. Detrol LA 4 mg p.o. q.d.   SOCIAL HISTORY:  No tobacco.  Alcohol occasional.  Widowed, lives by  herself.  Independent with her ADLs.   FAMILY HISTORY:  Significant for hypertension, MI, CVA, and brother deceased  with CHF.  No history of blood clots or pulmonary embolus.   REVIEW OF SYSTEMS:  Otherwise noncontributory except for abdominal  complaints evaluated by __________ per EDP.   PHYSICAL EXAMINATION:  VITAL SIGNS:  Afebrile, heart rate 96, respirations  19, O2 saturation 96%, blood pressure 135/80.  GENERAL:  She is very pleasant.  Alert and oriented x 3.  HEENT:  Sclerae are clear.   Pharynx benign.  NECK:  Without lymphadenopathy, JVD, thyromegaly.  CHEST:  Rales, wheezing.  CARDIAC:  Regular rate and rhythm, no murmur.  ABDOMEN:  Soft and nontender.  Positive bowel sounds.  No organomegaly.  No  masses.  EXTREMITIES:  With 2+ edema to the mid left thigh with distal erythema on  the foot and ankle.  There is evidence from the posterior thigh lesion  removal.   LABORATORY DATA:  INR 0.8.  CMET normal.  CBC near normal, with hemoglobin  11.9, white blood cell count 6.2.   Abdomen films pending.  Doppler with positive DVTs from the popliteal to  common femoral vein.   ASSESSMENT AND PLAN:  1. Deep vein thrombosis of the left lower extremity:  She was started on IV  heparin in the ER with bolus.  __________ cardiovascular stable.  She is     admitted with continued IV heparin to start Coumadin and continue her     __________ .  2. Hypertension:  Mildly increased.  Will follow up __________  on current     medications.  3. Other medical problems as above:  Continue home medications.  Otherwise     stable.  4. Disposition:  To home when INR is greater than 2-3.  Plan six-month total     treatment with Coumadin.  5. Do Not Resuscitate per patient request after discussion.                                               Corwin Levins, M.D. [facility]    JWJ/MEDQ  D:  09/10/2001  T:  09/16/2001  Job:  [id number]   cc:   Angelena Sole, M.D. [facility]

## 2010-06-27 NOTE — Assessment & Plan Note (Signed)
[facility]   NAME:  ROSELIN, WIEMANN            ACCOUNT NO.:  [id number]   MEDICAL RECORD NO.:  [id number]      DATE OF BIRTH:  [birth year]   PHYSICIAN:  Jake Shark A. Tanda Rockers, M.D. VISIT DATE:  03/22/2006                                   OFFICE VISIT   SUBJECTIVE:  Ms. Rohde is an 77 year old lady who we are seeing  for a posttraumatic stasis ulcer involving her left anterior shin.  In  the interim, we have treated her with a compression wrap.  She denies  excessive drainage, malodor, pain, or fever.   OBJECTIVE:  Blood pressure is 137/87, respirations 18, pulse rate 81,  temperature 97.6.  Inspection of the lower extremity shows that the  compression wrap has been effective in decreasing her edema.  There are  linear wrinkles in her leg.  The wound itself has shown contraction with  some areas of moist eschar.  A full thickness debridement was performed  with a 10 blade with hemorrhage control with direct pressure.   ASSESSMENT:  Improved stasis ulcer.   PLAN:  We returned the patient to a multi-layer compression wrap.  We  will reevaluate her in one week.           ______________________________  Theresia Majors. Tanda Rockers, M.D.     Cephus Slater  D:  03/22/2006  T:  03/23/2006  Job:  [id number]

## 2010-06-27 NOTE — Discharge Summary (Signed)
NAME:  [name], [name]                      ACCOUNT NO.:  [id number]   MEDICAL RECORD NO.:  [id number]                   PATIENT TYPE:  INP   LOCATION:  0354                                 FACILITY:  [facility]   PHYSICIAN:  Isla Pence, M.D. [facility]         DATE OF BIRTH:  November 07, 1918   DATE OF ADMISSION:  09/10/2001  DATE OF DISCHARGE:  09/22/2001                                 DISCHARGE SUMMARY   DISCHARGE DIAGNOSES:  1. Pulmonary embolism and left lower extremity deep venous thrombosis.  2. Bilateral basilar infiltrates and also right upper lobe pneumonia with     question of loculated area on the right upper lobe question of mass     versus changes from previous radiation treatment.  3. B12 deficiency anemia.  4. Mild congestive heart failure secondary to volume that she received     during the hospital course and probably also some element from the     pneumonia.  5. History of hypertension.  6. Left renal nodule seen on CT of chest which needs further work-up as an     outpatient.  7. History of hypertension.  8. History of gastroesophageal reflux disease.  9. History of hypothyroidism.  10.      History of degenerative joint disease of the hands.  11.      History of bladder spasm.  12.      History of right cataract and left implant.   DISCHARGE MEDICATIONS:  1. Coumadin as directed.  2. Atenolol 50 mg p.o. q.d.  3. Synthroid 0.1 mg p.o. q.d.  4. Celebrex 100 mg p.o. q.d. with food.  5. Ceftin 500 mg p.o. b.i.d. for another five more days.  6. Hydrochlorothiazide 12.5 mg q.d.  7. Norvasc same as home dose.  8. Detrol LA 4 mg p.o. q.d.  9. Nexium 20 mg p.o. q.d. according to her chart, although I think Nexium     comes as a 40.  10.      Vitamin B12 500 mcg p.o. q.d.  11.      Lasix 20 mg p.o. q.d. x1 more week.  12.      The patient has been taken off of Premarin secondary to her current     pulmonary emboli with DVT.   DISCHARGE ACTIVITIES:  As  tolerated.   DISCHARGE DIET:  Low salt.   FOLLOW UP:  With Dr. Lutricia Horsfall on August 25 at 10 a.m.  Also, she will need  a follow-up with Dr. Sherene Sires one week after she has her repeat CT scan done.   CONSULTATIONS:  Pulmonary with Dr. Sherene Sires was consulted for question of the  right upper lobe nodule versus loculated fluid.  Pending laboratory work-up  she will need a CT scan of her chest and abdomen and pelvis paying special  attention to the renal area to look for these nodules that was seen on the  CT scan of the chest  on the left kidney in the upper pole.  The repeat CT  scan recommendation for it to be done will be in approximately a month from  when she had it done, so September 4 around that date is when she needs the  repeat CT scan done.  She is to follow up with Dr. Sherene Sires a week after that.   HOSPITAL COURSE:  This very functional 77 year old female had presented to  the emergency room with complaints of swelling in her left leg for two  weeks.  The patient was seen earlier that day at the Urgent Care and  referred to the ER where Doppler showed a left lower extremity DVT.  She  developed this most likely after she had a lesion taken off from her left  leg in the thigh by dermatology.  She, in addition, has also been on  Premarin.  The patient also developed shortness of breath as the admission  progressed and she subsequently underwent a CT of her chest which showed  that she certainly had small bilateral pulmonary emboli.  The patient was  started on heparin.  It took a while for her PTT to get therapeutic and, in  fact, they were having to follow her heparin levels to dose her  anticoagulation.  She was started on Coumadin with great trouble achieving  INR that was in the therapeutic range, but subsequently three or four days  prior to discharge she developed therapeutic INR and was subsequently taken  off of heparin.   Also noted on the CT scan of her chest was right upper lobe  and bibasilar  infiltrates with subsegmental atelectatic changes and pleural thickening.  There was also an the area of the right upper lobe that was of concern for a  mass.  Secondary to the fact that the patient was fairly sick with the acute  pulmonary emboli, the recommendation was to get a follow-up CT scan in the  next few weeks.  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   The patient was also noted to be anemic during the course of this hospital  stay.  Her H&H on August 4 was 10.1 and 29.5.  She dropped to about 9.9 and  29.1.  She subsequently underwent iron studies and B12 folate levels and  they have come back suggesting B12 deficiency.  She was subsequently started  on vitamin B12 orally 500 mcg p.o. q.d.  My concern was for the fact that  she was on heparin and Coumadin.  I was afraid in that setting to do IM  shots and certainly there is evidence published that suggests that high  doses of vitamin B12 may be sufficient for B12 deficiency treatment.  The  patient's H&H a few days prior to discharge was 10.1 and 29.7, respectively.  She never developed any heme-positive stools during her hospitalization.  Her iron studies done on August 9 showed a serum iron of 62 which is normal,  TIBC 277 which was also normal, percent saturation 22% which is normal.  Vitamin B12 was 197 which is certainly low, normal range being 211-911.  Folate was normal at 834.  Ferritin was also normal at 197.  The patient  tells me that she has never had a colonoscopy so this can be  done as an  outpatient as part of her work-up for anemia and considering that she has  never had a screening colonoscopy.   In regards to her pneumonia, patient was initially started on ceftriaxone and subsequently switched to Ceftin.  The  ceftriaxone was started on August  4 and subsequent switched to Ceftin.  She is to continue on this for another  five more days.  Again, as previously mentioned, she will need a repeat CT  scan of her chest and abdomen.  The patient was started on albuterol and  nebulizer treatments and also O2 because of her PE.  She may not need either  one of these for a long period of time and that can be decided by Dr. Ruthine Dose  at the time of her follow-up.   In regards to her left renal nodule noted on CT scan, the recommendation is  when she undergoes CT of the chest to get a CT of the abdomen and pelvis to  fully look at the left upper pole nodule.  The radiologist was of the  opinion this most likely might represent just a benign cyst but further work-  up is needed for that.   For her mild CHF she was diuresed and has clinically improved.  She was  switched to p.o. Lasix two days prior to admission and my recommendation is  just for her to be on this for about another week.   For her hypertension no further intervention was needed during this  hospitalization.  The patient is going to be discharged on the same home  medications.   The patient is going to be discharged in stable condition.  If there are any  changes to this dictation, Dr. Amador Cunas will do an addendum dictation to  this.                                               Isla Pence, M.D. [facility]    RRV/MEDQ  D:  09/21/2001  T:  09/23/2001  Job:  [id number]   cc:   Angelena Sole, M.D. [facility]

## 2010-07-03 ENCOUNTER — Other Ambulatory Visit: Payer: Self-pay | Admitting: Internal Medicine

## 2010-07-09 MED ORDER — DILTIAZEM HCL ER COATED BEADS 240 MG PO CP24: 240.0000 mg | ORAL_CAPSULE | Freq: Every day | ORAL | Status: DC

## 2010-07-11 ENCOUNTER — Encounter: Payer: Self-pay | Admitting: Cardiology

## 2010-07-11 ENCOUNTER — Ambulatory Visit (INDEPENDENT_AMBULATORY_CARE_PROVIDER_SITE_OTHER): Payer: Medicare Other | Admitting: Cardiology

## 2010-07-11 DIAGNOSIS — I509 Heart failure, unspecified: Secondary | ICD-10-CM

## 2010-07-11 DIAGNOSIS — J449 Chronic obstructive pulmonary disease, unspecified: Secondary | ICD-10-CM

## 2010-07-11 DIAGNOSIS — R0602 Shortness of breath: Secondary | ICD-10-CM

## 2010-07-11 DIAGNOSIS — I4891 Unspecified atrial fibrillation: Secondary | ICD-10-CM

## 2010-07-11 NOTE — Assessment & Plan Note (Signed)
At this point the etiology of her exertional shortness of breath is not clear.  I have considered whether it could be from increased heart rate from her atrial fib.  Her resting heart rate is controlled.  In 2011 she did wear a Holter that did not show any marked tachycardia.  In fact there were some mild bradycardia.  At that time I stopped her digoxin.  After that we restarted and she felt better with the restart.  Her GFR is only 27.  I am very hesitant to put her digoxin dose higher than 0.625 mg daily.  Digoxin level will be checked.  Coronary ischemia must be considered.  She does have some chest heaviness.  Her left ventricular function is normal by history.There is no proven coronary disease in the past.  I decided to proceed with a stress nuclear scan to see if there is marked ischemia.  The patient is elderly.  However mentally she is fully alert.

## 2010-07-11 NOTE — Assessment & Plan Note (Signed)
It is possible that the patient's shortness of breath is all due to her lung disease and her age.

## 2010-07-11 NOTE — Patient Instructions (Signed)
Lab today (dig level) Your physician has requested that you have a lexiscan myoview. For further information please visit [email or web address] Please follow instruction sheet, as given. Your physician recommends that you schedule a follow-up appointment in: after stress test.

## 2010-07-11 NOTE — Assessment & Plan Note (Signed)
Digoxin level will be checked before considering any change in medications.

## 2010-07-11 NOTE — Assessment & Plan Note (Signed)
Volume status is controlled.  I am very hesitant to push her diuretic.

## 2010-07-11 NOTE — Progress Notes (Signed)
HPI [name] is seen for followup of atrial fibrillation and shortness of breath.  Saw her last in May 29, 2010.  At that time we felt her atrial fibrillation was controlled.  She is on a small dose of digoxin.  She wonders if she could take a larger dose.  I am very hesitant with her GFR in the range of 27.  Digoxin level will be checked before any modification.  She does have exertional shortness of breath.  There is no PND or orthopnea.  Her volume status appears to be stable.  She has edema near the end of the day but it is gone in the morning after wrestling with her feet up.  It is still possible that she has increased heart rate with walking.  The [name] has also had some chest discomfort.  I am not convinced that this represents angina.  This will have to be kept in mind. Allergies  Allergen Reactions  . Ciprofloxacin     REACTION: itching , diarrhea  . Hydrocodone-Acetaminophen     REACTION: hallucination    Current Outpatient Prescriptions  Medication Sig Dispense Refill  . acetaminophen (TYLENOL) 325 MG tablet Take 650 mg by mouth every 6 (six) hours as needed.        . ALPRAZolam (XANAX) 0.25 MG tablet Take 0.25 mg by mouth at bedtime as needed.        Marland Kitchen amLODipine (NORVASC) 5 MG tablet Take 5 mg by mouth daily.        . B Complex-C (B-COMPLEX WITH VITAMIN C) tablet Take 1 tablet by mouth daily.        . Calcium Carbonate-Vit D-Min 600-400 MG-UNIT TABS Take 1 tablet by mouth daily.        . digoxin (LANOXIN) 0.125 MG tablet Take 1/2 tab daily   30 tablet  11  . diltiazem (CARDIZEM CD) 240 MG 24 hr capsule Take 1 capsule (240 mg total) by mouth daily.  30 capsule  6  . esomeprazole (NEXIUM) 40 MG capsule Take 40 mg by mouth 2 (two) times daily.        . furosemide (LASIX) 40 MG tablet Take 1 tablet (40 mg total) by mouth 2 (two) times daily.  60 tablet  6  . levothyroxine (SYNTHROID, LEVOTHROID) 88 MCG tablet Take 88 mcg by mouth daily.        . Melatonin 5 MG TABS Take 1 tablet  by mouth.        . metoprolol succinate (TOPROL-XL) 25 MG 24 hr tablet Take 25 mg by mouth daily.        . Misc Natural Products (OSTEO BI-FLEX ADV TRIPLE ST) TABS Take 1 tablet by mouth daily.        . Multiple Vitamin (MULTIVITAMIN) tablet Take 1 tablet by mouth daily.        . pravastatin (PRAVACHOL) 20 MG tablet Take 20 mg by mouth at bedtime.        . Probiotic Product (ACIDOPHILUS PROBIOTIC BLEND) CAPS Take 1 capsule by mouth daily.        Marland Kitchen DISCONTD: guaiFENesin-codeine (ROBITUSSIN AC) 100-10 MG/5ML syrup 1-2 tsp po qhs prn cough  120 mL  0    History   Social History  . Marital Status: Widowed    Spouse Name: N/A    Number of Children: N/A  . Years of Education: N/A   Occupational History  . Retired    Social History Main Topics  . Smoking status: Never Smoker   .  Smokeless tobacco: Not on file  . Alcohol Use: Yes  . Drug Use: No  . Sexually Active: Not on file   Other Topics Concern  . Not on file   Social History Narrative  . No narrative on file    Family History  Problem Relation Age of Onset  . Aortic aneurysm    . Breast cancer      1st degree relative <50  . Coronary artery disease      1st degree relative <60  . Prostate cancer      1st degree relative <50  . Stroke      Female- 1st degree relative <50  . Diabetes      1st degree relative    Past Medical History  Diagnosis Date  . Osteopenia   . History of cervical cancer   . Benign neoplasm of adrenal gland   . Hypothyroidism   . COPD (chronic obstructive pulmonary disease)   . Esophageal reflux   . Osteoarthrosis, unspecified whether generalized or localized, unspecified site   . Unspecified pleural effusion   . Subarachnoid hemorrhage     No coumadin or ASA-Dr.[facility].Marland Kitchenaneurysm..no treatment  . Hypertension   . Hyperlipidemia   . Personal history of venous thrombosis and embolism     No coumadin because of Subarachnoid hemorrhage  . Unspecified disorder resulting from  impaired renal function   . Congestive heart failure, unspecified     EF 55-60%, echo, March, 2011  . GERD (gastroesophageal reflux disease)   . Aortic insufficiency     Mild, echo, March, 2011  . Mitral regurgitation     Mild, echo, March, 2011  . Aortic root dilatation     4.5 cm echo, November, 2010 / no mention  . Dizziness   . Anemia   . Chronic kidney disease   . Pulmonary embolism     Also DVT but no Coumadin because of subarachnoid hemorrhage  . Atrial fibrillation     cannot take Coumadin /  Holter May, 2011.. mild bradycardia digoxin stopped / September, 2011, shortness of breath and palpitations.. digoxin resume /  March, 2012 can't find it digoxin on med list... to be restarted  . Shingles     November, 2011  . Ejection fraction     55-60%, echo, March, 2011  . Shortness of breath     Past Surgical History  Procedure Date  . Foot surgery   . Cataract extraction   . Cholecystectomy   . Pulmonary embolism surgery     vena cava filter  . Orif hip fracture     07/2007  . Epidural block injection     11/2009    ROS  [name] denies fever, chills, headache, sweats, rash, change in vision, change in hearing, cough, nausea vomiting, urinary symptoms.  All other systems are reviewed and are negative  PHYSICAL EXAM [name] is stable today.  She is oriented to person time and place.  Affect is normal.  Head is atraumatic.  There is no xanthelasma.  There is no jugular venous distention.  Lungs revealed decreased breath sounds.  There is no respiratory distress.  Cardiac exam reveals an S1-S2.  The rhythm is irregularly irregular.  There is a soft systolic murmur.  The abdomen is soft.  There is trace peripheral edema at this time.  There are no musculoskeletal deformities.  There are chronic senile skin changes but no rashes Filed Vitals:   07/11/10 1108  BP: 149/83  Pulse: 75  Resp: 18  Height: 5\' 4"  (1.626 m)  Weight: 146 lb (66.225 kg)    EKG is done today and  reviewed by me.  There is atrial fibrillation.  The rate is controlled at rest.  ASSESSMENT & PLAN

## 2010-07-16 ENCOUNTER — Ambulatory Visit (HOSPITAL_COMMUNITY): Payer: Medicare Other | Attending: Cardiology | Admitting: Radiology

## 2010-07-16 VITALS — Ht 64.0 in | Wt 150.0 lb

## 2010-07-16 DIAGNOSIS — I4891 Unspecified atrial fibrillation: Secondary | ICD-10-CM

## 2010-07-16 DIAGNOSIS — R079 Chest pain, unspecified: Secondary | ICD-10-CM

## 2010-07-16 DIAGNOSIS — I4949 Other premature depolarization: Secondary | ICD-10-CM

## 2010-07-16 DIAGNOSIS — R0609 Other forms of dyspnea: Secondary | ICD-10-CM

## 2010-07-16 DIAGNOSIS — R0602 Shortness of breath: Secondary | ICD-10-CM | POA: Insufficient documentation

## 2010-07-16 MED ORDER — TECHNETIUM TC 99M TETROFOSMIN IV KIT
11.0000 | PACK | Freq: Once | INTRAVENOUS | Status: AC | PRN
  Administered 2010-07-16: 11 via INTRAVENOUS

## 2010-07-16 MED ORDER — TECHNETIUM TC 99M TETROFOSMIN IV KIT
33.0000 | PACK | Freq: Once | INTRAVENOUS | Status: AC | PRN
  Administered 2010-07-16: 33 via INTRAVENOUS

## 2010-07-16 MED ORDER — REGADENOSON 0.4 MG/5ML IV SOLN
0.4000 mg | Freq: Once | INTRAVENOUS | Status: AC
  Administered 2010-07-16: 0.4 mg via INTRAVENOUS

## 2010-07-16 NOTE — Progress Notes (Signed)
[facility] SITE 3 NUCLEAR MED [address] [place] [address] [phone]  Cardiology Nuclear Med Study  CAROLANNE MERCIER is a 77 y.o. female [id number] October 26, 1918   Nuclear Med Background Indication for Stress Test:  Evaluation for Ischemia History:  COPD and Patient says she had a Heart Cath at [facility] many years ago, no report (she has pictures), '05 MPS:no ischemia, EF=65%, 3/11 Echo:EF=55-60%, mild AI, h/o Thoracic Aortic Aneurysm Cardiac Risk Factors: Family History - CAD, Hypertension and Lipids  Symptoms:  Chest Pain with and without Exertion (last date of chest discomfort was Saturday), Diaphoresis, Dizziness, DOE/SOB, Fatigue, Near Syncope, Palpitations and Rapid HR    Nuclear Pre-Procedure Caffeine/Decaff Intake:  None NPO After: 8:00pm   Lungs:  Clear.  O2 sat 96% on RA. IV 0.9% NS with Angio Cath:  20g  IV Site: R Antecubital  IV Started by:  Irean Hong, RN  Chest Size (in):  34 Cup Size: C  Height: 5\' 4"  (1.626 m)  Weight:  150 lb (68.04 kg)  BMI:  Body mass index is 25.75 kg/(m^2). Tech Comments:  Toprol held this a.m.    Nuclear Med Study 1 or 2 day study: 1 day  Stress Test Type:  Lexiscan  Reading MD: Charlton Haws, MD  Order Authorizing Provider:  Willa Rough, MD  Resting Radionuclide: Technetium 35m Tetrofosmin  Resting Radionuclide Dose: 11.0 mCi   Stress Radionuclide:  Technetium 5m Tetrofosmin  Stress Radionuclide Dose: 33.0 mCi           Stress Protocol Rest HR: 74 Stress HR: 90  Rest BP: 110/68 Stress BP: 119/61  Exercise Time (min): n/a METS: n/a   Predicted Max HR: 130 bpm % Max HR: 69.23 bpm Rate Pressure Product: 41324   Dose of Adenosine (mg):  n/a Dose of Lexiscan: 0.4 mg  Dose of Atropine (mg): n/a Dose of Dobutamine: n/a mcg/kg/min (at max HR)  Stress Test Technologist: Smiley Houseman, CMA-N  Nuclear Technologist:  Doyne Keel, CNMT     Rest Procedure:  Myocardial perfusion imaging was performed at rest 45  minutes following the intravenous administration of Technetium 65m Tetrofosmin.  Rest ECG: Atrial Fibrillation, nonspecific T-wave changes and occasional PVC's.  Stress Procedure:  The patient received IV Lexiscan 0.4 mg over 15-seconds.  Technetium 52m Tetrofosmin injected at 30-seconds.  There were no significant changes with Lexiscan.  Quantitative spect images were obtained after a 45 minute delay.  Stress ECG: No significant change from baseline ECG and Underlying rhythm is atrial fibrillation  QPS Raw Data Images:  Normal; no motion artifact; normal heart/lung ratio. Stress Images:  Normal homogeneous uptake in all areas of the myocardium. Rest Images:  Normal homogeneous uptake in all areas of the myocardium. Subtraction (SDS):  Normal Transient Ischemic Dilatation (Normal <1.22):  0.92 Lung/Heart Ratio (Normal <0.45):  0.27  Quantitative Gated Spect Images QGS EDV:  85 ml QGS ESV:  23 ml QGS cine images:  NL LV Function; NL Wall Motion QGS EF: 73%  Impression Exercise Capacity:  Lexiscan with no exercise. BP Response:  Normal blood pressure response. Clinical Symptoms:  No chest pain. ECG Impression:  No significant ST segment change suggestive of ischemia. Comparison with Prior Nuclear Study: No images to compare  Overall Impression:  Normal stress nuclear study.    Charlton Haws

## 2010-07-17 NOTE — Progress Notes (Signed)
COPY ROUTED TO DR.KATZ

## 2010-07-22 ENCOUNTER — Encounter: Payer: Self-pay | Admitting: Cardiology

## 2010-07-22 DIAGNOSIS — R0602 Shortness of breath: Secondary | ICD-10-CM | POA: Insufficient documentation

## 2010-07-24 ENCOUNTER — Other Ambulatory Visit: Payer: Self-pay | Admitting: Family Medicine

## 2010-07-24 ENCOUNTER — Encounter: Payer: Self-pay | Admitting: Cardiology

## 2010-07-24 ENCOUNTER — Ambulatory Visit (INDEPENDENT_AMBULATORY_CARE_PROVIDER_SITE_OTHER): Payer: Medicare Other | Admitting: Cardiology

## 2010-07-24 DIAGNOSIS — R0602 Shortness of breath: Secondary | ICD-10-CM

## 2010-07-24 DIAGNOSIS — R609 Edema, unspecified: Secondary | ICD-10-CM

## 2010-07-24 MED ORDER — PRAVASTATIN SODIUM 20 MG PO TABS: 20.0000 mg | ORAL_TABLET | Freq: Every day | ORAL | Status: DC

## 2010-07-24 NOTE — Progress Notes (Signed)
HPI Patient is seen today to follow up shortness of breath.  I saw her last in the first, 2012.  Because she had some chest tightness with her shortness of breath decision was made to proceed with a nuclear exercise test.  This was done July 16, 2010.  Study showed no ischemia.  Her ejection fraction was 70%.  Since her last visit she feels a little better.  She had a good night's sleep last night Allergies  Allergen Reactions  . Contrast Media (Iodinated Diagnostic Agents) Other (See Comments)    No Contrast due to cerebral aneurysm per patient  . Ciprofloxacin     REACTION: itching , diarrhea  . Hydrocodone-Acetaminophen     REACTION: hallucination    Current Outpatient Prescriptions  Medication Sig Dispense Refill  . acetaminophen (TYLENOL) 325 MG tablet Take 650 mg by mouth every 6 (six) hours as needed.        . ALPRAZolam (XANAX) 0.25 MG tablet Take 0.25 mg by mouth at bedtime as needed.        Marland Kitchen amLODipine (NORVASC) 5 MG tablet Take 5 mg by mouth daily.        . B Complex-C (B-COMPLEX WITH VITAMIN C) tablet Take 1 tablet by mouth daily.        . Calcium Carbonate-Vit D-Min 600-400 MG-UNIT TABS Take 1 tablet by mouth daily.        . digoxin (LANOXIN) 0.125 MG tablet Take 1/2 tab daily   30 tablet  11  . diltiazem (CARDIZEM CD) 240 MG 24 hr capsule Take 1 capsule (240 mg total) by mouth daily.  30 capsule  6  . furosemide (LASIX) 40 MG tablet Take 1 tablet (40 mg total) by mouth 2 (two) times daily.  60 tablet  6  . IOPHEN C-NR 100-10 MG/5ML syrup       . levothyroxine (SYNTHROID, LEVOTHROID) 88 MCG tablet Take 88 mcg by mouth daily.        . Melatonin 5 MG TABS Take 1 tablet by mouth.        . metoprolol succinate (TOPROL-XL) 25 MG 24 hr tablet Take 25 mg by mouth daily.        . Misc Natural Products (OSTEO BI-FLEX ADV TRIPLE ST) TABS Take 1 tablet by mouth daily.        . Multiple Vitamin (MULTIVITAMIN) tablet Take 1 tablet by mouth daily.        . pravastatin (PRAVACHOL) 20 MG  tablet Take 20 mg by mouth at bedtime.        . Probiotic Product (ACIDOPHILUS PROBIOTIC BLEND) CAPS Take 1 capsule by mouth daily.        Marland Kitchen esomeprazole (NEXIUM) 40 MG capsule Take 40 mg by mouth 2 (two) times daily.          History   Social History  . Marital Status: Widowed    Spouse Name: N/A    Number of Children: N/A  . Years of Education: N/A   Occupational History  . Retired    Social History Main Topics  . Smoking status: Never Smoker   . Smokeless tobacco: Not on file  . Alcohol Use: Yes  . Drug Use: No  . Sexually Active: Not on file   Other Topics Concern  . Not on file   Social History Narrative  . No narrative on file    Family History  Problem Relation Age of Onset  . Aortic aneurysm    . Breast cancer  1st degree relative <50  . Coronary artery disease      1st degree relative <60  . Prostate cancer      1st degree relative <50  . Stroke      Female- 1st degree relative <50  . Diabetes      1st degree relative    Past Medical History  Diagnosis Date  . Osteopenia   . History of cervical cancer   . Benign neoplasm of adrenal gland   . Hypothyroidism   . COPD (chronic obstructive pulmonary disease)   . Esophageal reflux   . Osteoarthrosis, unspecified whether generalized or localized, unspecified site   . Unspecified pleural effusion   . Subarachnoid hemorrhage     No coumadin or ASA-Dr.[facility].Marland Kitchenaneurysm..no treatment  . Hypertension   . Hyperlipidemia   . Personal history of venous thrombosis and embolism     No coumadin because of Subarachnoid hemorrhage  . Unspecified disorder resulting from impaired renal function   . Congestive heart failure, unspecified     EF 55-60%, echo, March, 2011  . GERD (gastroesophageal reflux disease)   . Aortic insufficiency     Mild, echo, March, 2011  . Mitral regurgitation     Mild, echo, March, 2011  . Aortic root dilatation     4.5 cm echo, November, 2010 / no mention  . Dizziness    . Anemia   . Chronic kidney disease   . Pulmonary embolism     Also DVT but no Coumadin because of subarachnoid hemorrhage  . Atrial fibrillation     cannot take Coumadin /  Holter May, 2011.. mild bradycardia digoxin stopped / September, 2011, shortness of breath and palpitations.. digoxin resume /  March, 2012 can't find it digoxin on med list... to be restarted  . Shingles     November, 2011  . Ejection fraction     55-60%, echo, March, 2011  . Shortness of breath     Nuclear 07/11/2010,,73%, no ischemia    Past Surgical History  Procedure Date  . Foot surgery   . Cataract extraction   . Cholecystectomy   . Pulmonary embolism surgery     vena cava filter  . Orif hip fracture     07/2007  . Epidural block injection     11/2009    ROS  Patient denies fever, chills, headache, sweats, rash, change in vision, change in hearing, chest pain, nausea vomiting, urinary symptoms.  All other systems are reviewed and are negative.  PHYSICAL EXAM Patient is oriented to person time and place.  Affect is normal.  She is here with her helper today.  Head is atraumatic.  Lungs revealed decreased breath sounds.  There is kyphosis of the spine.  There is no jugular venous distention.  Cardiac exam reveals S1 and S2.  No clicks or significant murmurs.  The abdomen is soft.  She has no significant peripheral edema. Filed Vitals:   07/24/10 1116  BP: 138/68  Pulse: 66  Height: 5\' 3"  (1.6 m)  Weight: 148 lb (67.132 kg)    EKG Is not done today.  ASSESSMENT & PLAN

## 2010-07-24 NOTE — Assessment & Plan Note (Signed)
Edema is stable on her current diuretics.  No change in therapy.

## 2010-07-24 NOTE — Telephone Encounter (Signed)
done

## 2010-07-24 NOTE — Patient Instructions (Signed)
Your physician wants you to follow-up in:  6 months. You will receive a reminder letter in the mail two months in advance. If you don't receive a letter, please call our office to schedule the follow-up appointment.   

## 2010-07-24 NOTE — Assessment & Plan Note (Signed)
The nuclear study is negative.  It appears that ischemia is not feeling well with her symptoms.  I was very reassuring.  I encouraged her to increase her activity.  I suspect that her shortness of breath is due to pulmonary limitations she has related to her age.  No further workup.

## 2010-07-31 NOTE — Progress Notes (Signed)
Dr Myrtis Ser discussed w/pt at [facility] 6/14

## 2010-09-01 ENCOUNTER — Other Ambulatory Visit: Payer: Self-pay | Admitting: Cardiology

## 2010-09-05 ENCOUNTER — Other Ambulatory Visit: Payer: Self-pay | Admitting: Family Medicine

## 2010-09-05 DIAGNOSIS — E538 Deficiency of other specified B group vitamins: Secondary | ICD-10-CM

## 2010-09-08 ENCOUNTER — Other Ambulatory Visit (INDEPENDENT_AMBULATORY_CARE_PROVIDER_SITE_OTHER): Payer: Medicare Other

## 2010-09-08 DIAGNOSIS — R0989 Other specified symptoms and signs involving the circulatory and respiratory systems: Secondary | ICD-10-CM

## 2010-09-08 DIAGNOSIS — E538 Deficiency of other specified B group vitamins: Secondary | ICD-10-CM

## 2010-09-08 DIAGNOSIS — D649 Anemia, unspecified: Secondary | ICD-10-CM

## 2010-09-08 LAB — VITAMIN B12: Vitamin B-12: 965 pg/mL — ABNORMAL HIGH (ref 211–911)

## 2010-09-08 NOTE — Progress Notes (Signed)
Labs only

## 2010-09-08 NOTE — Progress Notes (Signed)
Addended by: Legrand Como on: 09/08/2010 03:23 PM   Modules accepted: Orders

## 2010-09-16 ENCOUNTER — Ambulatory Visit (INDEPENDENT_AMBULATORY_CARE_PROVIDER_SITE_OTHER): Payer: Medicare Other | Admitting: Family Medicine

## 2010-09-16 ENCOUNTER — Encounter: Payer: Self-pay | Admitting: Family Medicine

## 2010-09-16 VITALS — BP 114/72 | HR 82 | Temp 96.9°F | Wt 144.2 lb

## 2010-09-16 DIAGNOSIS — R5383 Other fatigue: Secondary | ICD-10-CM

## 2010-09-16 DIAGNOSIS — R197 Diarrhea, unspecified: Secondary | ICD-10-CM

## 2010-09-16 DIAGNOSIS — E039 Hypothyroidism, unspecified: Secondary | ICD-10-CM

## 2010-09-16 DIAGNOSIS — R5381 Other malaise: Secondary | ICD-10-CM

## 2010-09-16 DIAGNOSIS — K529 Noninfective gastroenteritis and colitis, unspecified: Secondary | ICD-10-CM

## 2010-09-16 DIAGNOSIS — R159 Full incontinence of feces: Secondary | ICD-10-CM

## 2010-09-16 DIAGNOSIS — E538 Deficiency of other specified B group vitamins: Secondary | ICD-10-CM

## 2010-09-16 LAB — BASIC METABOLIC PANEL
BUN: 39 mg/dL — ABNORMAL HIGH (ref 6–23)
CO2: 28 mEq/L (ref 19–32)
Calcium: 9.6 mg/dL (ref 8.4–10.5)
Glucose, Bld: 92 mg/dL (ref 70–99)
Sodium: 145 mEq/L (ref 135–145)

## 2010-09-16 LAB — HEPATIC FUNCTION PANEL
Bilirubin, Direct: 0.1 mg/dL (ref 0.0–0.3)
Total Bilirubin: 0.7 mg/dL (ref 0.3–1.2)

## 2010-09-16 NOTE — Patient Instructions (Signed)
Chronic Diarrhea Diarrhea is loose, watery stools. Having diarrhea means passing loose stools 3 or more times a day. Diarrhea that lasts longer than 4 weeks is considered long-lasting (chronic). Symptoms of chronic diarrhea may be continual or may come and go. People of all ages can get diarrhea. Body fluid loss (dehydration) may occur as a result of diarrhea. This means the body does not have as many fluids and salts (electrolytes) as it needs. CAUSES There are many causes of chronic diarrhea. Causes may be different for children and adults. The various causes can be grouped into 2 categories: diarrhea caused by an infection and diarrhea not caused by an infection. Sometimes, the cause is unknown. Diarrhea caused by an infection may result from:  Parasites.   Bacteria.   Viral infections.  Diarrhea not caused by an infection may result from:  Irritable bowel syndrome.   Reaction to medicines, such as antibiotics, cancer drugs, blood pressure medicines, and antacids.   Intestinal disease (Crohn's disease, ulcerative colitis, celiac disease).   Food allergies or sensitivity to additives (fructose, lactose, sugar substitutes).   Tumors.   Diabetes, thyroid disease, and other endocrine diseases.   Reduced blood flow to the intestine.   Previous surgery or radiation of the abdomen or gastrointestinal tract.  Risk factors for chronic diarrhea include:  Having a severely weakened immune system, such as from HIV/AIDS.   Taking certain types of cancer-fighting drugs (chemotherapy) or other medicines.   A recent organ transplant.   Having a portion of the stomach removed.   Traveling to countries where food and water supplies are often contaminated.  SYMPTOMS In addition to frequent, loose stools, diarrhea may cause:  Cramping.   Abdominal pain.   Nausea.   Urgent need to use the bathroom, or loss of bowel control.  If dehydration occurs, problems include:  Thirst.   Less  frequent urination.   Dark urine.   Dry skin.   Fatigue.   Dizziness.  Infections that cause diarrhea may also cause a fever, chills, or bloody stools. DIAGNOSIS Diagnosis may be difficult. Your caregiver must take a careful history and perform a physical exam. Tests given are based on your symptoms and history. Tests may include:  Blood or stool tests, in which 3 or more stool samples may be examined. Stool cultures may be used to test for bacteria or parasites.   X-rays.   A procedure in which a thin tube is inserted into the mouth or rectum (endoscopy). This allows the caregiver to look inside the intestine.  TREATMENT  Diarrhea caused by an infection can often be treated with antibiotics.   Diarrhea not caused by an infection is more difficult to diagnose and treat. Long-term medicine use or surgery may be required. Specific treatment should be discussed with your caregiver.   If the cause cannot determined, treatment to relieve symptoms includes:   Preventing dehydration. Serious health problems can occur if you do not maintain proper fluid levels. Many oral rehydration solutions (ORS) are available at drug stores. Ask your caregiver what product is best for you.   Not drinking beverages that contain caffeine (tea, coffee, soft drinks).   Not drinking alcohol. It causes dehydration.   Not relying on sports drinks and broths alone to maintain proper fluid levels. They should not be used to prevent severe dehydration.   Maintaining well-balanced nutrition. This may help you recover faster.  PREVENTION  Drink clean or purified water.   Use proper food handling techniques.  Maintain proper hand-washing habits.  HOME CARE INSTRUCTIONS  Avoid:   Caffeine.   Greasy foods.   High fiber.   If you have problems digesting lactose during or after an episode of diarrhea, you might want to try yogurt. Yogurt is often better tolerated, because it has less lactose than milk.  Yogurt with active, live bacterial cultures may even help you recover faster.  SEEK MEDICAL CARE IF: The person with diarrhea is an otherwise healthy adult and has:  Signs of dehydration.   Diarrhea for more than 2 days.   Severe pain in the abdomen or rectum.   An oral temperature above 100.4.   Stools containing blood or pus.   Stools that are black and tarry.  SEEK IMMEDIATE MEDICAL CARE IF: The person with diarrhea is a child, elderly person, or has a weakened immune system and has:  Signs of dehydration.   Diarrhea for more than 1 day.   Severe pain in the abdomen or rectum.   An oral temperature above 100.4, not controlled by medicine.   Stools containing blood or pus.   Stools that are black and tarry.  Document Released: 04/18/2003 Document Re-Released: 07/16/2009 [facility] Patient Information 2011 [place], [place].

## 2010-09-16 NOTE — Assessment & Plan Note (Signed)
Check labs secondary to diarrhea

## 2010-09-16 NOTE — Progress Notes (Signed)
  Subjective:    Patient ID: [name], female    DOB: 1919-01-27, 77 y.o.   MRN: [id number]  HPI Pt here to start B12 shots and is still c/o chronic diarrhea but is is worse.  She is taking probiotics and fiber.  No fevers---+ fatigue.    Review of Systems As above    Objective:   Physical Exam  Constitutional: She is oriented to person, place, and time. She appears well-developed and well-nourished.  Cardiovascular:  Murmur heard.      irreg irreg  Pulmonary/Chest: Effort normal and breath sounds normal.  Musculoskeletal: She exhibits no edema and no tenderness.  Neurological: She is alert and oriented to person, place, and time.  Skin: Skin is warm and dry.  Psychiatric: She has a normal mood and affect.          Assessment & Plan:

## 2010-09-17 DIAGNOSIS — K529 Noninfective gastroenteritis and colitis, unspecified: Secondary | ICD-10-CM | POA: Insufficient documentation

## 2010-09-17 MED ORDER — CYANOCOBALAMIN 1000 MCG/ML IJ SOLN
1000.0000 ug | Freq: Once | INTRAMUSCULAR | Status: AC
  Administered 2010-09-16: 1000 ug via INTRAMUSCULAR

## 2010-09-18 LAB — CLOSTRIDIUM DIFFICILE EIA: CDIFTX: NEGATIVE

## 2010-09-21 LAB — STOOL CULTURE

## 2010-09-22 ENCOUNTER — Ambulatory Visit (INDEPENDENT_AMBULATORY_CARE_PROVIDER_SITE_OTHER): Payer: Medicare Other | Admitting: Gastroenterology

## 2010-09-22 ENCOUNTER — Encounter: Payer: Self-pay | Admitting: Gastroenterology

## 2010-09-22 ENCOUNTER — Other Ambulatory Visit: Payer: Self-pay | Admitting: Family Medicine

## 2010-09-22 ENCOUNTER — Other Ambulatory Visit (INDEPENDENT_AMBULATORY_CARE_PROVIDER_SITE_OTHER): Payer: Medicare Other

## 2010-09-22 ENCOUNTER — Telehealth: Payer: Self-pay | Admitting: *Deleted

## 2010-09-22 VITALS — BP 122/76 | HR 80 | Ht 64.0 in | Wt 143.6 lb

## 2010-09-22 DIAGNOSIS — R197 Diarrhea, unspecified: Secondary | ICD-10-CM

## 2010-09-22 DIAGNOSIS — K219 Gastro-esophageal reflux disease without esophagitis: Secondary | ICD-10-CM

## 2010-09-22 LAB — CBC WITH DIFFERENTIAL/PLATELET
Basophils Relative: 0.4 % (ref 0.0–3.0)
Eosinophils Absolute: 0.2 10*3/uL (ref 0.0–0.7)
HCT: 43.1 % (ref 36.0–46.0)
Hemoglobin: 14.3 g/dL (ref 12.0–15.0)
Lymphocytes Relative: 28.3 % (ref 12.0–46.0)
Lymphs Abs: 1.7 10*3/uL (ref 0.7–4.0)
MCHC: 33.2 g/dL (ref 30.0–36.0)
MCV: 95.7 fl (ref 78.0–100.0)
Monocytes Absolute: 0.4 10*3/uL (ref 0.1–1.0)
Neutro Abs: 3.8 10*3/uL (ref 1.4–7.7)
RBC: 4.5 Mil/uL (ref 3.87–5.11)

## 2010-09-22 MED ORDER — METRONIDAZOLE 500 MG PO TABS: 500.0000 mg | ORAL_TABLET | Freq: Two times a day (BID) | ORAL | Status: DC

## 2010-09-22 MED ORDER — LEVOTHYROXINE SODIUM 75 MCG PO TABS: 75.0000 ug | ORAL_TABLET | Freq: Every day | ORAL | Status: DC

## 2010-09-22 MED ORDER — PRAVASTATIN SODIUM 20 MG PO TABS: 20.0000 mg | ORAL_TABLET | Freq: Every day | ORAL | Status: DC

## 2010-09-22 NOTE — Telephone Encounter (Signed)
Tsh low----Hyperthyroidism-----decrease synthroid to 75 mcg daily---recheck 2 months 244.9 TSH BUN/CR elevated---fax to nephrology Notes Recorded by Lelon Perla, DO on 09/18/2010 at 8:53 AM Negative --C diff  Left message to call back.

## 2010-09-22 NOTE — Progress Notes (Signed)
History of Present Illness: This is a 77 year old female here today with her daughter for evaluation of chronic diarrhea. She has multiple comorbidities however her health status has been stable. She relates a 6 month history of urgent, watery, nonbloody diarrhea occurring between 5 and 10 times each day. She states she will generally have watery bowel movements but occasionally has some semi-formed bowel movements. Her bowel movements tend to follow meals. She has had a few episodes of incontinence. She denies nocturnal diarrhea. She denies any medication changes or antibiotic usage around the time of the onset of diarrhea. Recent stool culture and C. difficile toxin were negative. Stool Hemoccults were done and the results are pending. She denies weight loss, change in appetite, nausea, vomiting, melena, hematochezia. She has had minimal lower abdominal discomfort on occasion over the past few weeks. She states her diarrhea improved slightly after changing from [employer] brand probiotic to Align last week. She takes a substantial amount of milk products on a daily basis. Her reflux symptoms are well-controlled on esomeprazole twice a day.  Past Medical History  Diagnosis Date  . Osteopenia   . History of cervical cancer   . Benign neoplasm of adrenal gland   . Hypothyroidism   . COPD (chronic obstructive pulmonary disease)   . Esophageal reflux   . Osteoarthrosis, unspecified whether generalized or localized, unspecified site   . Unspecified pleural effusion   . Subarachnoid hemorrhage     No coumadin or ASA-Dr.[facility].Marland Kitchenaneurysm..no treatment  . Hypertension   . Hyperlipidemia   . Personal history of venous thrombosis and embolism     No coumadin because of Subarachnoid hemorrhage  . Unspecified disorder resulting from impaired renal function   . Congestive heart failure, unspecified     EF 55-60%, echo, March, 2011  . GERD (gastroesophageal reflux disease)   . Aortic insufficiency    Mild, echo, March, 2011  . Mitral regurgitation     Mild, echo, March, 2011  . Aortic root dilatation     4.5 cm echo, November, 2010 / no mention  . Dizziness   . Anemia   . Chronic kidney disease   . Pulmonary embolism     Also DVT but no Coumadin because of subarachnoid hemorrhage  . Atrial fibrillation     cannot take Coumadin /  [place] May, 2011.. mild bradycardia digoxin stopped / September, 2011, shortness of breath and palpitations.. digoxin resume /  March, 2012 can't find it digoxin on med list... to be restarted  . Shingles     November, 2011  . Ejection fraction     55-60%, echo, March, 2011  . Shortness of breath     Nuclear 07/11/2010,,73%, no ischemia  . Renal insufficiency   . Macular degeneration (senile) of retina, unspecified   . Myalgia and myositis, unspecified   . B12 deficiency anemia   . Pulmonary embolism    Past Surgical History  Procedure Date  . Foot surgery   . Cataract extraction   . Cholecystectomy   . Pulmonary embolism surgery     vena cava filter  . Orif hip fracture     07/2007  . Epidural block injection     11/2009  . Abdominal hysterectomy   . Appendectomy     reports that she has never smoked. She has never used smokeless tobacco. She reports that she does not drink alcohol or use illicit drugs. family history includes Aortic aneurysm in her brother; Breast cancer in her sister; Coronary artery  disease in her mother; and Stroke in her father. Allergies  Allergen Reactions  . Contrast Media (Iodinated Diagnostic Agents) Other (See Comments)    No Contrast due to cerebral aneurysm per patient  . Ciprofloxacin     REACTION: itching , diarrhea  . Hydrocodone-Acetaminophen     REACTION: hallucination   Outpatient Encounter Prescriptions as of 09/22/2010  Medication Sig Dispense Refill  . cyanocobalamin (,VITAMIN B-12,) 1000 MCG/ML injection Inject 1,000 mcg into the muscle every 30 (thirty) days.        . Probiotic Product (ALIGN) 4 MG  CAPS Take 1 capsule by mouth daily.        Marland Kitchen acetaminophen (TYLENOL) 325 MG tablet Take 650 mg by mouth every 6 (six) hours as needed.        . ALPRAZolam (XANAX) 0.25 MG tablet Take 0.25 mg by mouth at bedtime as needed.        Marland Kitchen amLODipine (NORVASC) 5 MG tablet Take 5 mg by mouth daily.        . B Complex-C (B-COMPLEX WITH VITAMIN C) tablet Take 1 tablet by mouth daily.        . Calcium Carbonate-Vit D-Min 600-400 MG-UNIT TABS Take 1 tablet by mouth daily.        . digoxin (LANOXIN) 0.125 MG tablet Take 1/2 tab daily   30 tablet  11  . diltiazem (CARDIZEM CD) 240 MG 24 hr capsule Take 1 capsule (240 mg total) by mouth daily.  30 capsule  6  . esomeprazole (NEXIUM) 40 MG capsule Take 40 mg by mouth 2 (two) times daily.        . furosemide (LASIX) 40 MG tablet Take 1 tablet (40 mg total) by mouth 2 (two) times daily.  60 tablet  6  . gabapentin (NEURONTIN) 300 MG capsule       . IOPHEN C-NR 100-10 MG/5ML syrup       . levothyroxine (SYNTHROID, LEVOTHROID) 88 MCG tablet Take 88 mcg by mouth daily.        . Melatonin 5 MG TABS Take 1 tablet by mouth.        . metoprolol succinate (TOPROL-XL) 25 MG 24 hr tablet TAKE ONE TABLET BY MOUTH DAILY  30 tablet  11  . metroNIDAZOLE (FLAGYL) 500 MG tablet Take 1 tablet (500 mg total) by mouth 2 (two) times daily.  14 tablet  0  . Misc Natural Products (OSTEO BI-FLEX ADV TRIPLE ST) TABS Take 1 tablet by mouth daily.        . Multiple Vitamin (MULTIVITAMIN) tablet Take 1 tablet by mouth daily.        . pravastatin (PRAVACHOL) 20 MG tablet Take 1 tablet (20 mg total) by mouth at bedtime.  30 tablet  1  . DISCONTD: Probiotic Product (ACIDOPHILUS PROBIOTIC BLEND) CAPS Take 1 capsule by mouth daily.        Review of Systems: Pertinent positive and negative review of systems were noted in the above HPI section. All other review of systems were otherwise negative.  Physical Exam: General: Well developed , well nourished, no acute distress. Appears younger than  stated age Head: Normocephalic and atraumatic Eyes:  sclerae anicteric, EOMI Ears: Normal auditory acuity Mouth: No deformity or lesions Neck: Supple, no masses or thyromegaly Lungs: Clear throughout to auscultation with decreased breath sounds bilaterally Heart: Regular rate and rhythm; no murmurs, rubs or bruits Abdomen: Soft, non tender and non distended. No masses, hepatosplenomegaly or hernias noted. Normal Bowel sounds Rectal:  Significantly decreased anal sphincter tone, loose green brown Hemoccult negative stool the vault, no lesions Musculoskeletal: Symmetrical with no gross deformities  Skin: No lesions on visible extremities Pulses:  Normal pulses noted Extremities: No clubbing, cyanosis, edema or deformities noted Neurological: Alert oriented x 4, grossly nonfocal Cervical Nodes:  No significant cervical adenopathy Inguinal Nodes: No significant inguinal adenopathy Psychological:  Alert and cooperative. Normal mood and affect  Assessment and Recommendations:  1. Chronic diarrhea. Occasional fecal incontinence due to lax anal sphincter tone. Rule out microscopic colitis, celiac disease, inflammatory bowel disease, colorectal neoplasms, lactose intolerance and other disorders. Obtain a celiac panel and begin a trial of a lactose avoidance diet for one week. If this is not successfu proceed with a course of metronidazole. Continue Align. If her diarrhea persists or her stool Hemoccults are positive, plan to proceed with colonoscopy with propofol sedation given her comorbidities.  2. GERD. Symptoms controlled on esomeprazole twice daily and antireflux measures.

## 2010-09-22 NOTE — Patient Instructions (Addendum)
Stay on a lactose free diet for seven days. If lactose free diet does not help, pick up prescription from your pharmacy for metronidazole.  Continue Align once daily. Go to the basement for your labs today.  Lactose Free Diet Lactose is a carbohydrate that is found mainly in milk and milk products, as well as in foods with added milk or whey. Lactose must be digested by the enzyme in order to be used by the body. Lactose intolerance occurs when there is a shortage of lactase. When your body is not able to digest lactose, you may feel sick to your stomach (nausea), bloating, cramping, gas and diarrhea. TYPES OF LACTASE DEFICIENCY  Primary lactase deficiency. This is the most common type. It is characterized by a slow decrease in lactase activity.   Secondary lactase deficiency. This occurs following injury to the small intestinal mucosa as a result of diseases such as celiac disease, nontropical sprue, infectious gastroenteritis (stomach virus), malnutrition, parasites, or inflammatory bowel disease. It can also occur after treatment with medications that kill germs (antibiotics) or cancer drugs, or as a result of surgery.  Tolerance to lactose varies widely, and each person must determine how much milk can be consumed without developing symptoms. Drinking smaller portions of milk throughout the day may be helpful. Some studies suggest that slowing gastric emptying may help increase tolerance of milk products. This may be done by:  Consuming milk or milk products with a meal rather than alone.   Using milk with a higher fat content.  There are many dairy products that may be tolerated better than milk by some people:  Cheese (especially aged cheese) - the lactose content is much lower than in milk.   The use of cultured dairy products such as yogurt, buttermilk, cottage cheese, and sweet acidophilus milk (Kefir) for lactase-deficient individuals is usually well tolerated. This is because the  healthy bacteria help digest lactose.   Lactose-hydrolyzed milk (Lactaid) contains 40-90% less lactose than milk and may also be well tolerated.  ADEQUACY These diets may be deficient in calcium, riboflavin, and vitamin D, according to the Recommended Dietary Allowances of the [employer]. Depending on individual tolerances and the use of milk substitutes, milk, or other dairy products, these recommendations may be met. SPECIAL NOTES  Lactose is a carbohydrates. The major food source is dairy products. Reading food labels is important. Many products contain lactose even when they are not made from milk. Look for the following words: whey, milk solids, dry milk solids, nonfat dry milk powder. Typical sources of lactose other than dairy products include breads, candies, cold cuts, prepared and processed foods, and commercial sauces and gravies.   All foods must be prepared without milk, cream, or other dairy foods.   A vitamin/mineral supplement may be necessary. Consult your physician or Registered Dietitian.   Lactose also is found in many prescription and over-the-counter medications.   Soy milk and lactose-free supplements may be used as an alternative to milk.  FOOD GROUP ALLOWED/RECOMMENDED AVOID/USE SPARINGLY  BREADS / STARCHES 4 servings or more* Breads and rolls made without milk. [place], [place], or [place] bread. Breads and rolls that contain milk. Prepared mixes such as muffins, biscuits, waffles, pancakes. Sweet rolls, donuts, [place] toast (if made with milk or lactose).  Crackers: Soda crackers, graham crackers. Any crackers prepared without lactose. Zwieback crackers, corn curls, or any that contain lactose.  Cereals: Cooked or dry cereals prepared without lactose (read labels). Cooked or dry cereals prepared with  lactose (read labels). Total, Cocoa Krispies. Special K.  Potatoes / Pasta / Rice: Any prepared without milk or lactose. Popcorn. Instant potatoes, frozen  [place] fries, scalloped or au gratin potatoes.  VEGETABLES 2 servings or more Fresh, frozen, and canned vegetables. Creamed or breaded vegetables. Vegetables in a cheese sauce or with lactose-containing margarines.  FRUIT 2 servings or more All fresh, canned, or frozen fruits that are not processed with lactose. Any canned or frozen fruits processed with lactose.  MEAT & SUBSTITUTES 2 servings or more (4 to 6 oz. total per day) Plain beef, chicken, fish, [place], lamb, veal, pork, or ham. Kosher prepared meat products. Strained or junior meats that do not contain milk. Eggs, soy meat substitutes, nuts. Scrambled eggs, omelets, and souffles that contain milk. Creamed or breaded meat, fish, or fowl. Sausage products such as wieners, liver sausage, or cold cuts that contain milk solids. Cheese, cottage cheese, or cheese spreads.  MILK None. (See "BEVERAGES" for milk substitutes. See "DESSERTS" for ice cream and frozen desserts.) Milk (whole, 2%, skim, or chocolate). Evaporated, powdered, or condensed milk; malted milk.  SOUPS & COMBINATION FOODS Bouillon, broth, vegetable soups, clear soups, consomms. Homemade soups made with allowed ingredients. Combination or prepared foods that do not contain milk or milk products (read labels). Cream soups, chowders, commercially prepared soups containing lactose. Macaroni and cheese, pizza. Combination or prepared foods that contain milk or milk products.  DESSERTS & SWEETS In moderation Water and fruit ices; gelatin; angel food cake. Homemade cookies, pies, or cakes made from allowed ingredients. Pudding (if made with water or a milk substitute). Lactose-free tofu desserts. Sugar, honey, corn syrup, jam, jelly; marmalade; molasses (beet sugar); Pure sugar candy; marshmallows. Ice cream, ice milk, sherbet, custard, pudding, frozen yogurt. Commercial cake and cookie mixes. Desserts that contain chocolate. Pie crust made with milk-containing margarine; reduced-calorie  desserts made with a sugar substitute that contains lactose. Toffee, peppermint, butterscotch, chocolate, caramels.  FATS & OILS In moderation Butter (as tolerated; contains very small amounts of lactose). Margarines and dressings that do not contain milk, Vegetable oils, shortening, Miracle Whip, mayonnaise, nondairy cream & whipped toppings without lactose or milk solids added (examples: Coffee Rich, Carnation Coffeemate, Rich's Whipped Topping, PolyRich). Tomasa Blase. Margarines and salad dressings containing milk; cream, cream cheese; peanut butter with added milk solids, sour cream, chip dips, made with sour cream.  BEVERAGES Carbonated drinks; tea; coffee and freeze-dried coffee; some instant coffees (check labels). Fruit drinks; fruit and vegetable juice; Rice or Soy milk. Ovaltine, hot chocolate. Some cocoas; some instant coffees; instant iced teas; powdered fruit drinks (read labels).   CONDIMENTS / MISCELLANEOUS Soy sauce, carob powder, olives, gravy made with water, baker's cocoa, pickles, pure seasonings and spices, wine, pure monosodium glutamate, catsup, mustard. Some chewing gums, chocolate, some cocoas. Certain antibiotics and vitamin / mineral preparations. Spice blends if they contain milk products. MSG extender. Artificial sweeteners that contain lactose such as Equal (Nutra-Sweet) and Sweet 'n Low. Some nondairy creamers (read labels).  * These amounts indicate the minimum number of servings needed from the basic food groups to provide a variety of nutrients essential to good health. A maximum amount is listed if intake of certain foods must be controlled. Combination foods may count as full or partial servings from the food groups. Dark green, leafy, or orange vegetables are recommended 3 or 4 times weekly to provide vitamin A. A good source of vitamin C is recommended daily. Potatoes may be included as a serving of vegetables. SAMPLE  MENU*  Breakfast   Orange Juice.  Banana.    Bran flakes.   Nondairy Creamer.  Vienna Bread (toasted).   Butter or milk-free margarine.   Coffee or tea.    Noon Meal   Chicken Breast.  Rice.   Green beans.   Butter or milk-free margarine.  Fresh melon.   Coffee or tea.    Evening Meal   Roast Beef.  Baked potato.   Butter or milk-free margarine.   Broccoli.   Lettuce salad with vinegar and oil dressing.  [employer].   Coffee or tea.   Document Released: 07/18/2001 Document Re-Released: 07/25/2007 [facility] Patient Information 2011 [place], [place].   cc:  Loreen Freud, DO

## 2010-09-22 NOTE — Telephone Encounter (Signed)
Discuss with patient  

## 2010-09-22 NOTE — Telephone Encounter (Signed)
rx sent to pharmacy

## 2010-09-23 LAB — CELIAC PANEL 10
Tissue Transglut Ab: 8.9 U/mL (ref <20)
Tissue Transglutaminase Ab, IgA: 5.6 U/mL (ref <20)

## 2010-10-15 ENCOUNTER — Encounter: Payer: Self-pay | Admitting: Gastroenterology

## 2010-10-15 ENCOUNTER — Ambulatory Visit (INDEPENDENT_AMBULATORY_CARE_PROVIDER_SITE_OTHER): Payer: Medicare Other | Admitting: Gastroenterology

## 2010-10-15 VITALS — BP 108/70 | HR 64 | Ht 64.0 in | Wt 144.0 lb

## 2010-10-15 DIAGNOSIS — R197 Diarrhea, unspecified: Secondary | ICD-10-CM

## 2010-10-15 NOTE — Patient Instructions (Signed)
cc: Loreen Freud, DO

## 2010-10-15 NOTE — Progress Notes (Signed)
History of Present Illness: This is a 76 year old female here today with her daughter. After holding milk products her diarrhea has completely resolved. She takes a large amount of milk products in her regular diet and it is difficult for her to avoid them. She has tried Lactaid milk and soy milk but does not like the taste. She has not yet tried Lactaid pills. She has not yet tried soy milk or rice milk.. She is having 3-4 formed bowel movements each day with no episodes of incontinence.  Current Medications, Allergies, Past Medical History, Past Surgical History, Family History and Social History were reviewed in [employer] record.  Physical Exam: General: Well developed , well nourished, no acute distress Head: Normocephalic and atraumatic Eyes:  sclerae anicteric, EOMI Ears: Normal auditory acuity Mouth: No deformity or lesions Lungs: Clear throughout to auscultation Heart: Regular rate and rhythm; no murmurs, rubs or bruits Abdomen: Soft, non tender and non distended. No masses, hepatosplenomegaly or hernias noted. Normal Bowel sounds Musculoskeletal: Symmetrical with no gross deformities  Extremities: No clubbing, cyanosis, edema or deformities noted Neurological: Alert oriented x 4, grossly nonfocal Psychological:  Alert and cooperative. Normal mood and affect  Assessment and Recommendations:  1. Chronic diarrhea secondary to lactose intolerance. Continue a lactose avoidance diet or use Lactaid pills prior to taking products. Consider soy, rice or almond milk as a substitute She is returned to her primary physician for ongoing care. Followup with me as needed with me..  2. Fecal incontinence. Lax sphincter tone however without diarrhea she has not had episodes of incontinence.

## 2010-10-17 ENCOUNTER — Ambulatory Visit (INDEPENDENT_AMBULATORY_CARE_PROVIDER_SITE_OTHER): Payer: Medicare Other | Admitting: Family Medicine

## 2010-10-17 DIAGNOSIS — E538 Deficiency of other specified B group vitamins: Secondary | ICD-10-CM

## 2010-10-17 DIAGNOSIS — D518 Other vitamin B12 deficiency anemias: Secondary | ICD-10-CM

## 2010-10-17 MED ORDER — CYANOCOBALAMIN 1000 MCG/ML IJ SOLN
1000.0000 ug | Freq: Once | INTRAMUSCULAR | Status: AC
  Administered 2010-10-17: 1000 ug via INTRAMUSCULAR

## 2010-10-29 LAB — CBC
Hemoglobin: 12.1
MCHC: 33
Platelets: 127 — ABNORMAL LOW
RDW: 15

## 2010-10-29 LAB — BASIC METABOLIC PANEL
BUN: 26 — ABNORMAL HIGH
CO2: 27
Calcium: 9.4
GFR calc non Af Amer: 43 — ABNORMAL LOW
Glucose, Bld: 103 — ABNORMAL HIGH
Sodium: 143

## 2010-10-29 LAB — PROTIME-INR: INR: 0.9

## 2010-11-06 LAB — COMPREHENSIVE METABOLIC PANEL
Albumin: 2.7 — ABNORMAL LOW
BUN: 20
Creatinine, Ser: 1.34 — ABNORMAL HIGH
Total Bilirubin: 1
Total Protein: 6.9

## 2010-11-06 LAB — DIFFERENTIAL
Basophils Relative: 0
Monocytes Absolute: 0.4
Monocytes Relative: 4
Neutro Abs: 9.7 — ABNORMAL HIGH

## 2010-11-06 LAB — URINE MICROSCOPIC-ADD ON

## 2010-11-06 LAB — CBC
HCT: 27 — ABNORMAL LOW
HCT: 28 — ABNORMAL LOW
HCT: 30.3 — ABNORMAL LOW
Hemoglobin: 10 — ABNORMAL LOW
Hemoglobin: 11.3 — ABNORMAL LOW
Hemoglobin: 9.7 — ABNORMAL LOW
MCHC: 33
MCHC: 33
MCHC: 33
MCV: 94.3
MCV: 94.8
MCV: 95
MCV: 95.2
Platelets: 153
RBC: 2.96 — ABNORMAL LOW
RBC: 3.62 — ABNORMAL LOW
RDW: 15.7 — ABNORMAL HIGH
WBC: 6.6
WBC: 7.9

## 2010-11-06 LAB — CROSSMATCH: Antibody Screen: NEGATIVE

## 2010-11-06 LAB — BASIC METABOLIC PANEL
BUN: 14
CO2: 29
CO2: 31
Calcium: 8.3 — ABNORMAL LOW
Chloride: 106
Chloride: 108
Creatinine, Ser: 1.11
GFR calc non Af Amer: 54 — ABNORMAL LOW
Glucose, Bld: 102 — ABNORMAL HIGH
Glucose, Bld: 108 — ABNORMAL HIGH
Potassium: 4
Potassium: 4.7
Sodium: 142

## 2010-11-06 LAB — URINALYSIS, ROUTINE W REFLEX MICROSCOPIC
Bilirubin Urine: NEGATIVE
Protein, ur: 100 — AB
Urobilinogen, UA: 0.2

## 2010-11-06 LAB — CK TOTAL AND CKMB (NOT AT ARMC)
CK, MB: 2.3
Relative Index: INVALID
Total CK: 84

## 2010-11-06 LAB — APTT: aPTT: 30

## 2010-11-06 LAB — ABO/RH: ABO/RH(D): B POS

## 2010-11-14 ENCOUNTER — Ambulatory Visit (INDEPENDENT_AMBULATORY_CARE_PROVIDER_SITE_OTHER): Payer: Medicare Other | Admitting: *Deleted

## 2010-11-14 DIAGNOSIS — E538 Deficiency of other specified B group vitamins: Secondary | ICD-10-CM

## 2010-11-14 MED ORDER — CYANOCOBALAMIN 1000 MCG/ML IJ SOLN
1000.0000 ug | Freq: Once | INTRAMUSCULAR | Status: AC
  Administered 2010-11-14: 1000 ug via INTRAMUSCULAR

## 2010-11-18 ENCOUNTER — Other Ambulatory Visit: Payer: Self-pay | Admitting: Family Medicine

## 2010-11-18 MED ORDER — PRAVASTATIN SODIUM 20 MG PO TABS: 20.0000 mg | ORAL_TABLET | Freq: Every day | ORAL | Status: DC

## 2010-11-26 ENCOUNTER — Encounter: Payer: Self-pay | Admitting: Cardiology

## 2010-11-26 ENCOUNTER — Ambulatory Visit (INDEPENDENT_AMBULATORY_CARE_PROVIDER_SITE_OTHER): Payer: Medicare Other | Admitting: Cardiology

## 2010-11-26 DIAGNOSIS — I4891 Unspecified atrial fibrillation: Secondary | ICD-10-CM

## 2010-11-26 DIAGNOSIS — R079 Chest pain, unspecified: Secondary | ICD-10-CM

## 2010-11-26 DIAGNOSIS — R0602 Shortness of breath: Secondary | ICD-10-CM

## 2010-11-26 DIAGNOSIS — I509 Heart failure, unspecified: Secondary | ICD-10-CM

## 2010-11-26 NOTE — Assessment & Plan Note (Signed)
Her volume status is stable.  I've chosen not to change her diuretics.

## 2010-11-26 NOTE — Assessment & Plan Note (Signed)
Atrial fibrillation rate is controlled.  She may be slow at times.  Her digoxin was stopped.

## 2010-11-26 NOTE — Assessment & Plan Note (Signed)
The patient's O2 saturation was 92 at rest.  She walked around our office.  When she became fatigued enough to stop her O2 sat was 94%.  Lowest O2 sat was 91%.  The patient did not have significant increase in heart rate with walking.  I carefully reviewed the prior records concerning our evaluation of her shortness of breath.  She does not appear to need oxygen.  It is possible that she has some chronotropic incompetence.  She says that she feels rapid heartbeat at times.  But I think that she may have inadequate heart rate response at times.  Therefore digoxin will be held completely.  We will obtain a chest x-ray and see her for follow.

## 2010-11-26 NOTE — Patient Instructions (Addendum)
Your physician recommends that you schedule a follow-up appointment in: 3-4 WEEKS WITH DR Myrtis Ser Your physician has recommended you make the following change in your medication: STOP DIGOXIN A chest x-ray takes a picture of the organs and structures inside the chest, including the heart, lungs, and blood vessels. This test can show several things, including, whether the heart is enlarges; whether fluid is building up in the lungs; and whether pacemaker / defibrillator leads are still in place.DX SHORTNESS OF BREATH

## 2010-11-26 NOTE — Progress Notes (Signed)
HPI Patient is seen today to followup shortness of breath.  [name] says that [name] is very limited at home.  [name] says that [name] feels shortness of breath when only walking across one room at home.  Yet [name] does agree that at times can walk without difficulty in the grocery store.  This shortness of breath appears to be somewhat intermittent.  [name] denies PND or orthopnea.  [name]'s also had chest discomfort.  It occurs at rest.  Sometimes it is severe.  It is random.  [name] has had a somewhat more frequently lately and it is concerning her.  [name] had a nuclear exercise test in June, 2012.  Ejection fraction then was 70%.  There was no definite ischemia.  [name] is also having some nighttime leg cramps.  [name] has overall decreased energy.  [name] has not had syncope or presyncope.  Allergies  Allergen Reactions  . Contrast Media (Iodinated Diagnostic Agents) Other (See Comments)    No Contrast due to cerebral aneurysm per patient  . Ciprofloxacin     REACTION: itching , diarrhea  . Hydrocodone-Acetaminophen     REACTION: hallucination    Current Outpatient Prescriptions  Medication Sig Dispense Refill  . acetaminophen (TYLENOL) 325 MG tablet Take 650 mg by mouth every 6 (six) hours as needed.        . ALPRAZolam (XANAX) 0.25 MG tablet Take 0.25 mg by mouth at bedtime as needed.        Marland Kitchen amLODipine (NORVASC) 5 MG tablet Take 5 mg by mouth daily.        . B Complex-C (B-COMPLEX WITH VITAMIN C) tablet Take 1 tablet by mouth daily.        . Calcium Carbonate-Vit D-Min 600-400 MG-UNIT TABS Take 1 tablet by mouth daily.        . cyanocobalamin (,VITAMIN B-12,) 1000 MCG/ML injection Inject 1,000 mcg into the muscle every 30 (thirty) days.        . digoxin (LANOXIN) 0.125 MG tablet Take 1/2 tab daily   30 tablet  11  . diltiazem (CARDIZEM CD) 240 MG 24 hr capsule Take 1 capsule (240 mg total) by mouth daily.  30 capsule  6  . esomeprazole (NEXIUM) 40 MG capsule Take 40 mg by mouth 2 (two) times daily.        .  furosemide (LASIX) 40 MG tablet Take 1 tablet (40 mg total) by mouth 2 (two) times daily.  60 tablet  6  . lactase (LACTAID) 3000 UNITS tablet Take 1 tablet by mouth daily.        . Melatonin 5 MG TABS Take 1 tablet by mouth.        . metoprolol succinate (TOPROL-XL) 25 MG 24 hr tablet TAKE ONE TABLET BY MOUTH DAILY  30 tablet  11  . Misc Natural Products (OSTEO BI-FLEX ADV TRIPLE ST) TABS Take 1 tablet by mouth daily.        . Multiple Vitamin (MULTIVITAMIN) tablet Take 1 tablet by mouth daily.        . pravastatin (PRAVACHOL) 20 MG tablet Take 1 tablet (20 mg total) by mouth at bedtime.  30 tablet  0  . Probiotic Product (ALIGN) 4 MG CAPS Take 1 capsule by mouth daily.        Marland Kitchen SYNTHROID 75 MCG tablet TAKE 1 TABLET EVERY DAY  30 tablet  0    History   Social History  . Marital Status: Widowed    Spouse Name: N/A  Number of Children: N/A  . Years of Education: N/A   Occupational History  . Retired    Social History Main Topics  . Smoking status: Never Smoker   . Smokeless tobacco: Never Used  . Alcohol Use: No  . Drug Use: No  . Sexually Active: Not on file   Other Topics Concern  . Not on file   Social History Narrative  . No narrative on file    Family History  Problem Relation Age of Onset  . Aortic aneurysm Brother   . Breast cancer Sister     1st degree relative <50  . Coronary artery disease Mother     1st degree relative <60  . Stroke Father     Female- 1st degree relative <50    Past Medical History  Diagnosis Date  . Osteopenia   . History of cervical cancer   . Benign neoplasm of adrenal gland   . Hypothyroidism   . COPD (chronic obstructive pulmonary disease)   . Esophageal reflux   . Osteoarthrosis, unspecified whether generalized or localized, unspecified site   . Unspecified pleural effusion   . Subarachnoid hemorrhage     No coumadin or ASA-Dr.[facility].Marland Kitchenaneurysm..no treatment  . Hypertension   . Hyperlipidemia   . Personal history  of venous thrombosis and embolism     No coumadin because of Subarachnoid hemorrhage  . Unspecified disorder resulting from impaired renal function   . Congestive heart failure, unspecified     EF 55-60%, echo, March, 2011  . GERD (gastroesophageal reflux disease)   . Aortic insufficiency     Mild, echo, March, 2011  . Mitral regurgitation     Mild, echo, March, 2011  . Aortic root dilatation     4.5 cm echo, November, 2010 / no mention  . Dizziness   . Anemia   . Chronic kidney disease   . Pulmonary embolism     Also DVT but no Coumadin because of subarachnoid hemorrhage  . Atrial fibrillation     cannot take Coumadin /  Holter May, 2011.. mild bradycardia digoxin stopped / September, 2011, shortness of breath and palpitations.. digoxin resume /  March, 2012 can't find it digoxin on med list... to be restarted  . Shingles     November, 2011  . Ejection fraction     55-60%, echo, March, 2011  . Shortness of breath     Nuclear 07/11/2010,,73%, no ischemia  . Renal insufficiency   . Macular degeneration (senile) of retina, unspecified   . Myalgia and myositis, unspecified   . B12 deficiency anemia   . Pulmonary embolism     Past Surgical History  Procedure Date  . Foot surgery   . Cataract extraction   . Cholecystectomy   . Pulmonary embolism surgery     vena cava filter  . Orif hip fracture     07/2007  . Epidural block injection     11/2009  . Abdominal hysterectomy   . Appendectomy     ROS  Patient denies fever, chills, headache, rash, sweats, change in vision, change in hearing, cough, nausea vomiting, urinary symptoms.  All other systems are reviewed and are negative.  PHYSICAL EXAM Patient is oriented to person time and place.  Affect is normal.  [name] is here with her helper.  Head is atraumatic. There is no jugular venous stenting.  Lungs are clear.  Respiratory effort is not labored.  Cardiac exam reveals S1 and S2.  There is a soft  systolic murmur.  The abdomen  is soft.  There is trace peripheral edema.  There are no musculoskeletal deformities.  No skin rashes. Filed Vitals:   11/26/10 1551  BP: 128/68  Pulse: 85  Height: 5\' 3"  (1.6 m)  Weight: 143 lb (64.864 kg)    EKG is done today and reviewed by me.  [name] has atrial fibrillation.  Her resting rate is 65. There is no significant QRS change.  ASSESSMENT & PLAN

## 2010-11-26 NOTE — Assessment & Plan Note (Signed)
Etiology of her current chest pain is not clear.  However we have a nuclear study from June, 2012.  There was no definite ischemia.  No further workup at this time.

## 2010-11-27 ENCOUNTER — Ambulatory Visit (INDEPENDENT_AMBULATORY_CARE_PROVIDER_SITE_OTHER)
Admission: RE | Admit: 2010-11-27 | Discharge: 2010-11-27 | Disposition: A | Discharge status: Home / Self Care | Payer: Medicare Other | Source: Ambulatory Visit | Attending: Cardiology | Admitting: Cardiology

## 2010-11-27 DIAGNOSIS — R0602 Shortness of breath: Secondary | ICD-10-CM

## 2010-12-09 NOTE — Progress Notes (Signed)
Addended by: Reine Just on: 12/09/2010 04:17 PM   Modules accepted: Orders

## 2010-12-15 ENCOUNTER — Ambulatory Visit (INDEPENDENT_AMBULATORY_CARE_PROVIDER_SITE_OTHER): Payer: Medicare Other | Admitting: *Deleted

## 2010-12-15 DIAGNOSIS — Z23 Encounter for immunization: Secondary | ICD-10-CM

## 2010-12-15 DIAGNOSIS — E538 Deficiency of other specified B group vitamins: Secondary | ICD-10-CM

## 2010-12-15 MED ORDER — CYANOCOBALAMIN 1000 MCG/ML IJ SOLN
1000.0000 ug | Freq: Once | INTRAMUSCULAR | Status: AC
  Administered 2010-12-15: 1000 ug via INTRAMUSCULAR

## 2010-12-24 ENCOUNTER — Other Ambulatory Visit: Payer: Self-pay | Admitting: Family Medicine

## 2010-12-25 ENCOUNTER — Encounter: Payer: Self-pay | Admitting: Cardiology

## 2010-12-25 ENCOUNTER — Ambulatory Visit (INDEPENDENT_AMBULATORY_CARE_PROVIDER_SITE_OTHER): Payer: Medicare Other | Admitting: Cardiology

## 2010-12-25 DIAGNOSIS — I10 Essential (primary) hypertension: Secondary | ICD-10-CM

## 2010-12-25 DIAGNOSIS — I4891 Unspecified atrial fibrillation: Secondary | ICD-10-CM

## 2010-12-25 NOTE — Assessment & Plan Note (Signed)
The patient is doing well on her current regimen.  No further changes in her medicine.

## 2010-12-25 NOTE — Progress Notes (Signed)
HPI   Patient is seen today for cardiology followup.  I saw her last November 26, 2010. We obtained a chest x-ray that showed no active disease.  I decided that her heart rate might in fact be slower than we wanted and her digoxin was held.  She is actually doing pretty well today.  She's not having any more palpitations and she is getting around without major difficulties.  Allergies  Allergen Reactions  . Contrast Media (Iodinated Diagnostic Agents) Other (See Comments)    No Contrast due to cerebral aneurysm per patient  . Ciprofloxacin     REACTION: itching , diarrhea  . Hydrocodone-Acetaminophen     REACTION: hallucination    Current Outpatient Prescriptions  Medication Sig Dispense Refill  . acetaminophen (TYLENOL) 325 MG tablet Take 650 mg by mouth every 6 (six) hours as needed.        . ALPRAZolam (XANAX) 0.25 MG tablet Take 0.25 mg by mouth at bedtime as needed.        Marland Kitchen amLODipine (NORVASC) 5 MG tablet Take 5 mg by mouth daily.        . B Complex-C (B-COMPLEX WITH VITAMIN C) tablet Take 1 tablet by mouth daily.        . Calcium Carbonate-Vit D-Min 600-400 MG-UNIT TABS Take 1 tablet by mouth daily.        . cyanocobalamin (,VITAMIN B-12,) 1000 MCG/ML injection Inject 1,000 mcg into the muscle every 30 (thirty) days.        Marland Kitchen diltiazem (CARDIZEM CD) 240 MG 24 hr capsule Take 1 capsule (240 mg total) by mouth daily.  30 capsule  6  . esomeprazole (NEXIUM) 40 MG capsule Take 40 mg by mouth 2 (two) times daily.        . furosemide (LASIX) 40 MG tablet Take 1 tablet (40 mg total) by mouth 2 (two) times daily.  60 tablet  6  . Melatonin 5 MG TABS Take 1 tablet by mouth.        . metoprolol succinate (TOPROL-XL) 25 MG 24 hr tablet TAKE ONE TABLET BY MOUTH DAILY  30 tablet  11  . Misc Natural Products (OSTEO BI-FLEX ADV TRIPLE ST) TABS Take 1 tablet by mouth daily.        . Multiple Vitamin (MULTIVITAMIN) tablet Take 1 tablet by mouth daily.        . pravastatin (PRAVACHOL) 20 MG tablet  TAKE 1 TABLET (20 MG TOTAL) BY MOUTH AT BEDTIME.  30 tablet  0  . Probiotic Product (ALIGN) 4 MG CAPS Take 1 capsule by mouth daily.        Marland Kitchen SYNTHROID 75 MCG tablet TAKE 1 TABLET BY MOUTH EVERY DAY  30 tablet  2  . lactase (LACTAID) 3000 UNITS tablet Take 1 tablet by mouth daily.          History   Social History  . Marital Status: Widowed    Spouse Name: N/A    Number of Children: N/A  . Years of Education: N/A   Occupational History  . Retired    Social History Main Topics  . Smoking status: Never Smoker   . Smokeless tobacco: Never Used  . Alcohol Use: No  . Drug Use: No  . Sexually Active: Not on file   Other Topics Concern  . Not on file   Social History Narrative  . No narrative on file    Family History  Problem Relation Age of Onset  . Aortic aneurysm Brother   .  Breast cancer Sister     1st degree relative <50  . Coronary artery disease Mother     1st degree relative <60  . Stroke Father     Female- 1st degree relative <50    Past Medical History  Diagnosis Date  . Osteopenia   . History of cervical cancer   . Benign neoplasm of adrenal gland   . Hypothyroidism   . COPD (chronic obstructive pulmonary disease)   . Esophageal reflux   . Osteoarthrosis, unspecified whether generalized or localized, unspecified site   . Unspecified pleural effusion   . Subarachnoid hemorrhage     No coumadin or ASA-Dr.[facility].Marland Kitchenaneurysm..no treatment  . Hypertension   . Hyperlipidemia   . Personal history of venous thrombosis and embolism     No coumadin because of Subarachnoid hemorrhage  . Unspecified disorder resulting from impaired renal function   . Congestive heart failure, unspecified     EF 55-60%, echo, March, 2011  . GERD (gastroesophageal reflux disease)   . Aortic insufficiency     Mild, echo, March, 2011  . Mitral regurgitation     Mild, echo, March, 2011  . Aortic root dilatation     4.5 cm echo, November, 2010 / no mention  . Dizziness     . Anemia   . Chronic kidney disease   . Pulmonary embolism     Also DVT but no Coumadin because of subarachnoid hemorrhage  . Atrial fibrillation     cannot take Coumadin /  Holter May, 2011.. mild bradycardia digoxin stopped / September, 2011, shortness of breath and palpitations.. digoxin resume /  March, 2012 can't find it digoxin on med list... to be restarted  . Shingles     November, 2011  . Ejection fraction     55-60%, echo, March, 2011  . Shortness of breath     Nuclear 07/11/2010,,73%, no ischemia  . Renal insufficiency   . Macular degeneration (senile) of retina, unspecified   . Myalgia and myositis, unspecified   . B12 deficiency anemia   . Pulmonary embolism     Past Surgical History  Procedure Date  . Foot surgery   . Cataract extraction   . Cholecystectomy   . Pulmonary embolism surgery     vena cava filter  . Orif hip fracture     07/2007  . Epidural block injection     11/2009  . Abdominal hysterectomy   . Appendectomy     ROS Patient denies fever, chills, headache, sweats, rash, change in vision, change in hearing, chest pain, cough, nausea vomiting, urinary symptoms.  All other systems are reviewed and are negative.  PHYSICAL EXAM Patient looks good today.  She is oriented to person time and place.  Affect is normal.  She is here with her helper.  Lungs reveal a few scattered rhonchi.  Cardiac exam reveals S1 and S2.  There are no clicks or significant murmurs.  There is no jugular venous distention.  The abdomen is soft.  There is no peripheral edema. Filed Vitals:   12/25/10 1502  BP: 120/82  Pulse: 80  Height: 5\' 3"  (1.6 m)  Weight: 145 lb (65.772 kg)    ASSESSMENT & PLAN

## 2010-12-25 NOTE — Assessment & Plan Note (Signed)
Blood pressure is well controlled. No change in therapy. 

## 2010-12-25 NOTE — Patient Instructions (Signed)
Your physician recommends that you schedule a follow-up appointment in: 3 months with Dr Myrtis Ser

## 2011-01-14 ENCOUNTER — Ambulatory Visit: Payer: Medicare Other

## 2011-01-21 ENCOUNTER — Ambulatory Visit (INDEPENDENT_AMBULATORY_CARE_PROVIDER_SITE_OTHER): Payer: Medicare Other

## 2011-01-21 DIAGNOSIS — D518 Other vitamin B12 deficiency anemias: Secondary | ICD-10-CM

## 2011-01-21 MED ORDER — CYANOCOBALAMIN 1000 MCG/ML IJ SOLN
1000.0000 ug | Freq: Once | INTRAMUSCULAR | Status: AC
Start: 2011-01-21 — End: 2011-01-21
  Administered 2011-01-21: 1000 ug via INTRAMUSCULAR

## 2011-01-23 ENCOUNTER — Other Ambulatory Visit: Payer: Self-pay | Admitting: Family Medicine

## 2011-01-23 ENCOUNTER — Other Ambulatory Visit: Payer: Self-pay | Admitting: Cardiology

## 2011-02-09 ENCOUNTER — Telehealth: Payer: Self-pay | Admitting: Family Medicine

## 2011-02-09 ENCOUNTER — Inpatient Hospital Stay (HOSPITAL_COMMUNITY): Payer: Medicare Other

## 2011-02-09 ENCOUNTER — Ambulatory Visit (INDEPENDENT_AMBULATORY_CARE_PROVIDER_SITE_OTHER): Payer: Medicare Other

## 2011-02-09 ENCOUNTER — Encounter (HOSPITAL_COMMUNITY): Payer: Self-pay | Admitting: Cardiology

## 2011-02-09 ENCOUNTER — Inpatient Hospital Stay (HOSPITAL_COMMUNITY)
Admission: EM | Admit: 2011-02-09 | Discharge: 2011-02-13 | DRG: 065 | Disposition: A | Discharge status: Skilled Nursing Facility | Payer: Medicare Other | Attending: Internal Medicine | Admitting: Internal Medicine

## 2011-02-09 ENCOUNTER — Other Ambulatory Visit: Payer: Self-pay

## 2011-02-09 ENCOUNTER — Emergency Department (HOSPITAL_COMMUNITY): Payer: Medicare Other

## 2011-02-09 DIAGNOSIS — I672 Cerebral atherosclerosis: Secondary | ICD-10-CM

## 2011-02-09 DIAGNOSIS — I4891 Unspecified atrial fibrillation: Secondary | ICD-10-CM | POA: Diagnosis present

## 2011-02-09 DIAGNOSIS — I609 Nontraumatic subarachnoid hemorrhage, unspecified: Secondary | ICD-10-CM | POA: Diagnosis present

## 2011-02-09 DIAGNOSIS — I10 Essential (primary) hypertension: Secondary | ICD-10-CM

## 2011-02-09 DIAGNOSIS — I129 Hypertensive chronic kidney disease with stage 1 through stage 4 chronic kidney disease, or unspecified chronic kidney disease: Secondary | ICD-10-CM | POA: Diagnosis present

## 2011-02-09 DIAGNOSIS — E785 Hyperlipidemia, unspecified: Secondary | ICD-10-CM | POA: Diagnosis present

## 2011-02-09 DIAGNOSIS — J4489 Other specified chronic obstructive pulmonary disease: Secondary | ICD-10-CM | POA: Diagnosis present

## 2011-02-09 DIAGNOSIS — J449 Chronic obstructive pulmonary disease, unspecified: Secondary | ICD-10-CM | POA: Diagnosis present

## 2011-02-09 DIAGNOSIS — Z86718 Personal history of other venous thrombosis and embolism: Secondary | ICD-10-CM

## 2011-02-09 DIAGNOSIS — G8190 Hemiplegia, unspecified affecting unspecified side: Secondary | ICD-10-CM

## 2011-02-09 DIAGNOSIS — Z86711 Personal history of pulmonary embolism: Secondary | ICD-10-CM

## 2011-02-09 DIAGNOSIS — K529 Noninfective gastroenteritis and colitis, unspecified: Secondary | ICD-10-CM | POA: Diagnosis present

## 2011-02-09 DIAGNOSIS — E039 Hypothyroidism, unspecified: Secondary | ICD-10-CM | POA: Diagnosis present

## 2011-02-09 DIAGNOSIS — I509 Heart failure, unspecified: Secondary | ICD-10-CM | POA: Diagnosis present

## 2011-02-09 DIAGNOSIS — K5900 Constipation, unspecified: Secondary | ICD-10-CM | POA: Diagnosis present

## 2011-02-09 DIAGNOSIS — I2699 Other pulmonary embolism without acute cor pulmonale: Secondary | ICD-10-CM | POA: Diagnosis present

## 2011-02-09 DIAGNOSIS — I639 Cerebral infarction, unspecified: Secondary | ICD-10-CM | POA: Diagnosis present

## 2011-02-09 DIAGNOSIS — N183 Chronic kidney disease, stage 3 unspecified: Secondary | ICD-10-CM | POA: Diagnosis present

## 2011-02-09 DIAGNOSIS — M79609 Pain in unspecified limb: Secondary | ICD-10-CM

## 2011-02-09 DIAGNOSIS — R197 Diarrhea, unspecified: Secondary | ICD-10-CM | POA: Diagnosis present

## 2011-02-09 DIAGNOSIS — K219 Gastro-esophageal reflux disease without esophagitis: Secondary | ICD-10-CM | POA: Diagnosis present

## 2011-02-09 DIAGNOSIS — M549 Dorsalgia, unspecified: Secondary | ICD-10-CM | POA: Diagnosis present

## 2011-02-09 DIAGNOSIS — I6350 Cerebral infarction due to unspecified occlusion or stenosis of unspecified cerebral artery: Principal | ICD-10-CM | POA: Diagnosis present

## 2011-02-09 LAB — CBC
MCHC: 32 g/dL (ref 30.0–36.0)
MCV: 99.7 fL (ref 78.0–100.0)
Platelets: 131 10*3/uL — ABNORMAL LOW (ref 150–400)
RDW: 14.2 % (ref 11.5–15.5)
WBC: 4.9 10*3/uL (ref 4.0–10.5)

## 2011-02-09 LAB — POCT I-STAT TROPONIN I: Troponin i, poc: 0.11 ng/mL (ref 0.00–0.08)

## 2011-02-09 LAB — COMPREHENSIVE METABOLIC PANEL
ALT: 13 U/L (ref 0–35)
AST: 27 U/L (ref 0–37)
Albumin: 3.8 g/dL (ref 3.5–5.2)
CO2: 29 mEq/L (ref 19–32)
Calcium: 9.5 mg/dL (ref 8.4–10.5)
Creatinine, Ser: 1.31 mg/dL — ABNORMAL HIGH (ref 0.50–1.10)
GFR calc non Af Amer: 35 mL/min — ABNORMAL LOW (ref >90)
Sodium: 143 mEq/L (ref 135–145)

## 2011-02-09 LAB — URINALYSIS, ROUTINE W REFLEX MICROSCOPIC
Leukocytes, UA: NEGATIVE
Nitrite: NEGATIVE
Specific Gravity, Urine: 1.012 (ref 1.005–1.030)
Urobilinogen, UA: 0.2 mg/dL (ref 0.0–1.0)
pH: 7 (ref 5.0–8.0)

## 2011-02-09 LAB — APTT: aPTT: 30 seconds (ref 24–37)

## 2011-02-09 LAB — DIFFERENTIAL
Basophils Absolute: 0.1 10*3/uL (ref 0.0–0.1)
Basophils Relative: 1 % (ref 0–1)
Eosinophils Relative: 3 % (ref 0–5)
Lymphocytes Relative: 25 % (ref 12–46)
Neutro Abs: 3.2 10*3/uL (ref 1.7–7.7)

## 2011-02-09 LAB — URINE MICROSCOPIC-ADD ON

## 2011-02-09 LAB — PROTIME-INR: INR: 0.99 (ref 0.00–1.49)

## 2011-02-09 MED ORDER — DILTIAZEM HCL ER COATED BEADS 240 MG PO CP24
240.0000 mg | ORAL_CAPSULE | Freq: Every day | ORAL | Status: DC
  Administered 2011-02-10 – 2011-02-13 (×4): 240 mg via ORAL
  Filled 2011-02-09 (×4): qty 1

## 2011-02-09 MED ORDER — PROPYLENE GLYCOL 0.6 % OP SOLN: 1.0000 [drp] | Freq: Two times a day (BID) | OPHTHALMIC | Status: DC

## 2011-02-09 MED ORDER — ALIGN 4 MG PO CAPS: 1.0000 | ORAL_CAPSULE | Freq: Every day | ORAL | Status: DC

## 2011-02-09 MED ORDER — METOPROLOL SUCCINATE ER 25 MG PO TB24
25.0000 mg | ORAL_TABLET | Freq: Every day | ORAL | Status: DC
  Administered 2011-02-10 – 2011-02-13 (×4): 25 mg via ORAL
  Filled 2011-02-09 (×4): qty 1

## 2011-02-09 MED ORDER — CALCIUM CARBONATE-VIT D-MIN 600-400 MG-UNIT PO TABS: 1.0000 | ORAL_TABLET | Freq: Every day | ORAL | Status: DC

## 2011-02-09 MED ORDER — CYANOCOBALAMIN 1000 MCG/ML IJ SOLN
1000.0000 ug | INTRAMUSCULAR | Status: DC
  Administered 2011-02-12: 1000 ug via INTRAMUSCULAR
  Filled 2011-02-09 (×2): qty 1

## 2011-02-09 MED ORDER — FLORA-Q PO CAPS
1.0000 | ORAL_CAPSULE | Freq: Every day | ORAL | Status: DC
  Administered 2011-02-09 – 2011-02-13 (×5): 1 via ORAL
  Filled 2011-02-09 (×5): qty 1

## 2011-02-09 MED ORDER — FUROSEMIDE 40 MG PO TABS
40.0000 mg | ORAL_TABLET | Freq: Two times a day (BID) | ORAL | Status: DC
  Administered 2011-02-10 – 2011-02-11 (×4): 40 mg via ORAL
  Filled 2011-02-09 (×8): qty 1

## 2011-02-09 MED ORDER — LEVOTHYROXINE SODIUM 75 MCG PO TABS
75.0000 ug | ORAL_TABLET | Freq: Every day | ORAL | Status: DC
  Administered 2011-02-10 – 2011-02-13 (×4): 75 ug via ORAL
  Filled 2011-02-09 (×5): qty 1

## 2011-02-09 MED ORDER — ONE-DAILY MULTI VITAMINS PO TABS: 1.0000 | ORAL_TABLET | Freq: Every day | ORAL | Status: DC

## 2011-02-09 MED ORDER — OSTEO BI-FLEX ADV TRIPLE ST PO TABS: 1.0000 | ORAL_TABLET | Freq: Every day | ORAL | Status: DC

## 2011-02-09 MED ORDER — ADULT MULTIVITAMIN W/MINERALS CH
1.0000 | ORAL_TABLET | Freq: Every day | ORAL | Status: DC
  Administered 2011-02-09 – 2011-02-13 (×5): 1 via ORAL
  Filled 2011-02-09 (×5): qty 1

## 2011-02-09 MED ORDER — POLYVINYL ALCOHOL 1.4 % OP SOLN
1.0000 [drp] | Freq: Two times a day (BID) | OPHTHALMIC | Status: DC
  Administered 2011-02-09 – 2011-02-13 (×8): 1 [drp] via OPHTHALMIC
  Filled 2011-02-09: qty 15

## 2011-02-09 MED ORDER — AMLODIPINE BESYLATE 5 MG PO TABS
5.0000 mg | ORAL_TABLET | Freq: Every day | ORAL | Status: DC
  Administered 2011-02-10 – 2011-02-13 (×4): 5 mg via ORAL
  Filled 2011-02-09 (×5): qty 1

## 2011-02-09 MED ORDER — SIMVASTATIN 10 MG PO TABS
10.0000 mg | ORAL_TABLET | Freq: Every day | ORAL | Status: DC
  Administered 2011-02-09 – 2011-02-10 (×2): 10 mg via ORAL
  Filled 2011-02-09 (×2): qty 1

## 2011-02-09 MED ORDER — LACTASE 3000 UNITS PO TABS
3.0000 | ORAL_TABLET | Freq: Every day | ORAL | Status: DC
  Filled 2011-02-09: qty 3

## 2011-02-09 MED ORDER — SENNOSIDES-DOCUSATE SODIUM 8.6-50 MG PO TABS: 1.0000 | ORAL_TABLET | Freq: Every evening | ORAL | Status: DC | PRN

## 2011-02-09 MED ORDER — B COMPLEX-C PO TABS
1.0000 | ORAL_TABLET | Freq: Every day | ORAL | Status: DC
  Administered 2011-02-09 – 2011-02-13 (×5): 1 via ORAL
  Filled 2011-02-09 (×5): qty 1

## 2011-02-09 MED ORDER — PANTOPRAZOLE SODIUM 40 MG PO TBEC
40.0000 mg | DELAYED_RELEASE_TABLET | Freq: Every day | ORAL | Status: DC
  Administered 2011-02-09 – 2011-02-13 (×5): 40 mg via ORAL
  Filled 2011-02-09 (×5): qty 1

## 2011-02-09 MED ORDER — LACTASE 9000 UNITS PO CHEW
1.0000 | CHEWABLE_TABLET | Freq: Every day | ORAL | Status: DC
  Administered 2011-02-09 – 2011-02-13 (×5): 9000 [IU] via ORAL
  Filled 2011-02-09 (×5): qty 1

## 2011-02-09 MED ORDER — CALCIUM CARBONATE-VITAMIN D 250-125 MG-UNIT PO TABS
1.0000 | ORAL_TABLET | Freq: Every day | ORAL | Status: DC
  Administered 2011-02-13: 1 via ORAL
  Filled 2011-02-09 (×4): qty 1

## 2011-02-09 NOTE — H&P (Signed)
[name] is an 77 y.o. female.   Chief Complaint: Rt upper extremity weakness. HPI: Pt is 77 yr old female who states that at about 8:00pm last night she had sudden onset of weakness and loss of use of her rt hand.  She thought that maybe her hand had just fallen asleep.  She finished watching TV and then went to bed. When she awoke this am, her hand still didn't work and she broke two coffee cups trying to use it.  Furthermore, the patient states that she did not sleep well.  She states that she just didn't feel right.   She denies confusion, difficulty with speech, swallowing, or dizziness.  She denies any new LE weakness or change in gait.  Pt uses a cane to get around since her subarachnoid hemorrhage.  She also denies chest pain, SOB or cough.  Past Medical History  Diagnosis Date  . Osteopenia   . History of cervical cancer   . Benign neoplasm of adrenal gland   . Hypothyroidism   . COPD (chronic obstructive pulmonary disease)   . Esophageal reflux   . Osteoarthrosis, unspecified whether generalized or localized, unspecified site   . Unspecified pleural effusion   . Subarachnoid hemorrhage     No coumadin or ASA-Dr.[facility].Marland Kitchenaneurysm..no treatment  . Hypertension   . Hyperlipidemia   . Personal history of venous thrombosis and embolism     No coumadin because of Subarachnoid hemorrhage  . Unspecified disorder resulting from impaired renal function   . Congestive heart failure, unspecified     EF 55-60%, echo, March, 2011  . GERD (gastroesophageal reflux disease)   . Aortic insufficiency     Mild, echo, March, 2011  . Mitral regurgitation     Mild, echo, March, 2011  . Aortic root dilatation     4.5 cm echo, November, 2010 / no mention  . Dizziness   . Anemia   . Chronic kidney disease   . Pulmonary embolism     Also DVT but no Coumadin because of subarachnoid hemorrhage  . Campath-induced atrial fibrillation     cannot take Coumadin /  Holter May,  2011.. mild bradycardia digoxin stopped / September, 2011, shortness of breath and palpitations.. digoxin resume /  March, 2012 can't find it digoxin on med list... to be restarted  . Shingles     November, 2011  . Ejection fraction     55-60%, echo, March, 2011  . Shortness of breath     Nuclear 07/11/2010,,73%, no ischemia  . Renal insufficiency   . Macular degeneration (senile) of retina, unspecified   . Myalgia and myositis, unspecified   . B12 deficiency anemia   . Pulmonary embolism     Past Surgical History  Procedure Date  . Foot surgery   . Cataract extraction   . Cholecystectomy   . Pulmonary embolism surgery     vena cava filter  . Orif hip fracture     07/2007  . Epidural block injection     11/2009  . Abdominal hysterectomy   . Appendectomy     Family History  Problem Relation Age of Onset  . Aortic aneurysm Brother   . Breast cancer Sister     1st degree relative <50  . Coronary artery disease Mother     1st degree relative <60  . Stroke Father     Female- 1st degree relative <50   Social History:  reports that she has never smoked. She has  never used smokeless tobacco. She reports that she does not drink alcohol or use illicit drugs. No current facility-administered medications on file as of 02/09/2011.   Medications Prior to Admission  Medication Sig Dispense Refill  . amLODipine (NORVASC) 5 MG tablet TAKE 1 TABLET BY MOUTH DAILY  30 tablet  10  . B Complex-C (B-COMPLEX WITH VITAMIN C) tablet Take 1 tablet by mouth daily.        . Calcium Carbonate-Vit D-Min 600-400 MG-UNIT TABS Take 1 tablet by mouth daily.        . cyanocobalamin (,VITAMIN B-12,) 1000 MCG/ML injection Inject 1,000 mcg into the muscle every 30 (thirty) days.        Marland Kitchen diltiazem (CARDIZEM CD) 240 MG 24 hr capsule Take 1 capsule (240 mg total) by mouth daily.  30 capsule  6  . furosemide (LASIX) 40 MG tablet Take 1 tablet (40 mg total) by mouth 2 (two) times daily.  60 tablet  6  . lactase  (LACTAID) 3000 UNITS tablet Take 1 tablet by mouth daily.        . metoprolol succinate (TOPROL-XL) 25 MG 24 hr tablet TAKE ONE TABLET BY MOUTH DAILY  30 tablet  11  . Misc Natural Products (OSTEO BI-FLEX ADV TRIPLE ST) TABS Take 1 tablet by mouth daily.        . Multiple Vitamin (MULTIVITAMIN) tablet Take 1 tablet by mouth daily.        . pravastatin (PRAVACHOL) 20 MG tablet TAKE 1 TABLET BY MOUTH AT BEDTIME  30 tablet  1  . Probiotic Product (ALIGN) 4 MG CAPS Take 1 capsule by mouth daily.        Marland Kitchen SYNTHROID 75 MCG tablet TAKE 1 TABLET BY MOUTH EVERY DAY  30 tablet  2    Allergies:  Allergies  Allergen Reactions  . Contrast Media (Iodinated Diagnostic Agents) Other (See Comments)    No Contrast due to cerebral aneurysm per patient  . Ciprofloxacin     REACTION: itching , diarrhea  . Hydrocodone-Acetaminophen     REACTION: hallucination    Constitutional: positive for fatigue, negative for chills, fevers and sweats Eyes: negative for icterus, irritation, redness and visual disturbance Ears, nose, mouth, throat, and face: negative for earaches, epistaxis, hearing loss, sore throat and voice change Respiratory: negative for cough, dyspnea on exertion, hemoptysis, sputum and wheezing Cardiovascular: negative for chest pain, chest pressure/discomfort, dyspnea, palpitations and syncope Gastrointestinal: negative for abdominal pain, constipation, diarrhea, dysphagia, jaundice, melena, nausea and vomiting Genitourinary:negative for dysuria, frequency, hematuria and hesitancy Integument/breast: negative for pruritus, rash and skin color change Hematologic/lymphatic: negative for bleeding, easy bruising, lymphadenopathy and petechiae, Unable to take coumadin or blood thinners due to history of subarachnoid hemorrhage. Musculoskeletal:positive for muscle weakness, negative for back pain, myalgias, neck pain and stiff joints Neurological: positive for weakness, negative for coordination problems,  dizziness, gait problems, paresthesia, seizures, speech problems, tremors and vertigo Behavioral/Psych: negative for anxiety, depression and irritability   General appearance: cooperative, appears stated age, fatigued and no distress Head: Normocephalic, without obvious abnormality, atraumatic Eyes: conjunctivae/corneas clear. PERRL, EOM's intact. Fundi benign. Throat: lips, mucosa, and tongue normal; teeth and gums normal Neck: no adenopathy, no carotid bruit, no JVD, supple, symmetrical, trachea midline and thyroid not enlarged, symmetric, no tenderness/mass/nodules Resp: clear to auscultation bilaterally Chest wall: no tenderness Cardio: regular rate and rhythm, S1, S2 normal, no murmur, click, rub or gallop GI: soft, non-tender; bowel sounds normal; no masses,  no organomegaly Extremities: extremities normal,  atraumatic, no cyanosis or edema Pulses: 2+ and symmetric Skin: Skin color, texture, turgor normal. No rashes or lesions Lymph nodes: Cervical, supraclavicular, and axillary nodes normal. Neurologic: Cranial nerves: normal  Pt with 5/5 muscle strength upper extremities b/l, but 3/5 muscle strength of rt hand grip strength.  Pt is 4/5 muscle strength of rt lower extremity (old) and 5/5 strength on the left. Sensation intact.  Results for orders placed during the hospital encounter of 02/09/11 (from the past 48 hour(s))  CBC     Status: Abnormal   Collection Time   02/09/11 11:27 AM      Component Value Range Comment   WBC 4.9  4.0 - 10.5 (K/uL)    RBC 3.92  3.87 - 5.11 (MIL/uL)    Hemoglobin 12.5  12.0 - 15.0 (g/dL)    HCT 40.9  81.1 - 91.4 (%)    MCV 99.7  78.0 - 100.0 (fL)    MCH 31.9  26.0 - 34.0 (pg)    MCHC 32.0  30.0 - 36.0 (g/dL)    RDW 78.2  95.6 - 21.3 (%)    Platelets 131 (*) 150 - 400 (K/uL)   DIFFERENTIAL     Status: Normal   Collection Time   02/09/11 11:27 AM      Component Value Range Comment   Neutrophils Relative 65  43 - 77 (%)    Neutro Abs 3.2  1.7 -  7.7 (K/uL)    Lymphocytes Relative 25  12 - 46 (%)    Lymphs Abs 1.2  0.7 - 4.0 (K/uL)    Monocytes Relative 7  3 - 12 (%)    Monocytes Absolute 0.3  0.1 - 1.0 (K/uL)    Eosinophils Relative 3  0 - 5 (%)    Eosinophils Absolute 0.2  0.0 - 0.7 (K/uL)    Basophils Relative 1  0 - 1 (%)    Basophils Absolute 0.1  0.0 - 0.1 (K/uL)   COMPREHENSIVE METABOLIC PANEL     Status: Abnormal   Collection Time   02/09/11 11:27 AM      Component Value Range Comment   Sodium 143  135 - 145 (mEq/L)    Potassium 4.1  3.5 - 5.1 (mEq/L)    Chloride 105  96 - 112 (mEq/L)    CO2 29  19 - 32 (mEq/L)    Glucose, Bld 91  70 - 99 (mg/dL)    BUN 24 (*) 6 - 23 (mg/dL)    Creatinine, Ser 0.86 (*) 0.50 - 1.10 (mg/dL)    Calcium 9.5  8.4 - 10.5 (mg/dL)    Total Protein 7.7  6.0 - 8.3 (g/dL)    Albumin 3.8  3.5 - 5.2 (g/dL)    AST 27  0 - 37 (U/L)    ALT 13  0 - 35 (U/L)    Alkaline Phosphatase 71  39 - 117 (U/L)    Total Bilirubin 0.4  0.3 - 1.2 (mg/dL)    GFR calc non Af Amer 35 (*) >90 (mL/min)    GFR calc Af Amer 40 (*) >90 (mL/min)   PROTIME-INR     Status: Normal   Collection Time   02/09/11 11:27 AM      Component Value Range Comment   Prothrombin Time 13.3  11.6 - 15.2 (seconds)    INR 0.99  0.00 - 1.49    APTT     Status: Normal   Collection Time   02/09/11 11:27 AM  Component Value Range Comment   aPTT 30  24 - 37 (seconds)   POCT I-STAT TROPONIN I     Status: Abnormal   Collection Time   02/09/11 12:09 PM      Component Value Range Comment   Troponin i, poc 0.11 (*) 0.00 - 0.08 (ng/mL)    Comment NOTIFIED PHYSICIAN      Comment 3            URINALYSIS, ROUTINE W REFLEX MICROSCOPIC     Status: Abnormal   Collection Time   02/09/11  1:53 PM      Component Value Range Comment   Color, Urine YELLOW  YELLOW     APPearance CLEAR  CLEAR     Specific Gravity, Urine 1.012  1.005 - 1.030     pH 7.0  5.0 - 8.0     Glucose, UA NEGATIVE  NEGATIVE (mg/dL)    Hgb urine dipstick NEGATIVE   NEGATIVE     Bilirubin Urine NEGATIVE  NEGATIVE     Ketones, ur NEGATIVE  NEGATIVE (mg/dL)    Protein, ur 147 (*) NEGATIVE (mg/dL)    Urobilinogen, UA 0.2  0.0 - 1.0 (mg/dL)    Nitrite NEGATIVE  NEGATIVE     Leukocytes, UA NEGATIVE  NEGATIVE    URINE MICROSCOPIC-ADD ON     Status: Abnormal   Collection Time   02/09/11  1:53 PM      Component Value Range Comment   Squamous Epithelial / LPF RARE  RARE     WBC, UA 0-2  <3 (WBC/hpf)    Bacteria, UA RARE  RARE     Casts HYALINE CASTS (*) NEGATIVE     @[id number] @  Blood pressure 139/81, pulse 72, temperature 98.5 F (36.9 C), temperature source Oral, resp. rate 18, SpO2 95.00%.   *RADIOLOGY REPORT*  Clinical Data: Right arm weakness. Filling better.  CT HEAD WITHOUT CONTRAST  Technique: Contiguous axial images were obtained from the base of  the skull through the vertex without contrast.  Comparison: 09/09/2007.  Findings: Left parietal lobe small to moderate size infarct is new  from the prior CT scan although having an appearance suggesting  that this is subacute to remote in origin.  Prominent small vessel disease type changes. No CT evidence of  large acute infarct. Small acute infarct cannot be excluded by  CT.  No intracranial mass lesion detected on this unenhanced exam.  No intracranial hemorrhage.  Global atrophy without hydrocephalus.  Vascular calcifications.  IMPRESSION:  Subacute to remote left parietal lobe infarct.  Prominent small vessel disease type changes.  Original Report Authenticated By: Fuller Canada, M.D.  Assessment/Plan 1. CVA - pt presentation is greater than 12 hours after onset of symptoms.  She is not a candidate for TPA for this reason.  She will be admitted to telemetry, she will have a CVA work up including carotid dopplers, echocardiogram and MRI. 2. Atrial fibrillation - this is an old problem.  Pt rate appeared to be elevated at urgent care, but is controlled here.  She cannot take  anticoagulation due to a history of subarachnoid hemorrhage. 3. HTN - continue home medications 4. Hyperlipidemia - will recheck a lipid panel, but will continue home meds in the meantime. 5. GERD - PPI 6 DVT prophylaxis - pt is not a candidate for chemoprophylaxis - will use SCD's. 7.Hypothyroidsism - continue home meds.  [name] 02/09/2011, 3:12 PM

## 2011-02-09 NOTE — Telephone Encounter (Signed)
Dr Cleta Alberts from [facility] called and pt seemingly had TIA last night, R arm weakness.  Is in Afib w/ HR of 120.  He is sending her to hospital for evaluation.  Will forward to PCP.

## 2011-02-09 NOTE — ED Provider Notes (Signed)
History     CSN: [id number]  Arrival date & time 02/09/11  1016   First MD Initiated Contact with Patient 02/09/11 1021      Chief Complaint  Patient presents with  . Weakness    (Consider location/radiation/quality/duration/timing/severity/associated sxs/prior treatment) Patient is a 77 y.o. female presenting with weakness. The history is provided by the patient.  Weakness  Additional symptoms include weakness.   patient here with right upper extremity weakness which started yesterday about 7:30. Symptoms have been persistent up to this point. No status she is able to lift objects with her right hand. She denies any ataxia, headaches, neck pain, vomiting, lower extremity weakness. She denies any confusion or trouble speaking. No history of same. History of atrial fibrillation and she does not take any anticoagulants. Nothing makes her symptoms worse or better. No recent history of head trauma.  Patient went to an urgent care center prior to arrival and she was sent her for further evaluation  Past Medical History  Diagnosis Date  . Osteopenia   . History of cervical cancer   . Benign neoplasm of adrenal gland   . Hypothyroidism   . COPD (chronic obstructive pulmonary disease)   . Esophageal reflux   . Osteoarthrosis, unspecified whether generalized or localized, unspecified site   . Unspecified pleural effusion   . Subarachnoid hemorrhage     No coumadin or ASA-Dr.[facility].Marland Kitchenaneurysm..no treatment  . Hypertension   . Hyperlipidemia   . Personal history of venous thrombosis and embolism     No coumadin because of Subarachnoid hemorrhage  . Unspecified disorder resulting from impaired renal function   . Congestive heart failure, unspecified     EF 55-60%, echo, March, 2011  . GERD (gastroesophageal reflux disease)   . Aortic insufficiency     Mild, echo, March, 2011  . Mitral regurgitation     Mild, echo, March, 2011  . Aortic root dilatation     4.5 cm echo,  November, 2010 / no mention  . Dizziness   . Anemia   . Chronic kidney disease   . Pulmonary embolism     Also DVT but no Coumadin because of subarachnoid hemorrhage  . Campath-induced atrial fibrillation     cannot take Coumadin /  Holter May, 2011.. mild bradycardia digoxin stopped / September, 2011, shortness of breath and palpitations.. digoxin resume /  March, 2012 can't find it digoxin on med list... to be restarted  . Shingles     November, 2011  . Ejection fraction     55-60%, echo, March, 2011  . Shortness of breath     Nuclear 07/11/2010,,73%, no ischemia  . Renal insufficiency   . Macular degeneration (senile) of retina, unspecified   . Myalgia and myositis, unspecified   . B12 deficiency anemia   . Pulmonary embolism     Past Surgical History  Procedure Date  . Foot surgery   . Cataract extraction   . Cholecystectomy   . Pulmonary embolism surgery     vena cava filter  . Orif hip fracture     07/2007  . Epidural block injection     11/2009  . Abdominal hysterectomy   . Appendectomy     Family History  Problem Relation Age of Onset  . Aortic aneurysm Brother   . Breast cancer Sister     1st degree relative <50  . Coronary artery disease Mother     1st degree relative <60  . Stroke Father  Female- 1st degree relative <50    History  Substance Use Topics  . Smoking status: Never Smoker   . Smokeless tobacco: Never Used  . Alcohol Use: No    OB History    Grav Para Term Preterm Abortions TAB SAB Ect Mult Living                  Review of Systems  Neurological: Positive for weakness.  All other systems reviewed and are negative.    Allergies  Contrast media; Ciprofloxacin; and Hydrocodone-acetaminophen  Home Medications   Current Outpatient Rx  Name Route Sig Dispense Refill  . AMLODIPINE BESYLATE 5 MG PO TABS  TAKE 1 TABLET BY MOUTH DAILY 30 tablet 10  . B COMPLEX-C PO TABS Oral Take 1 tablet by mouth daily.      Marland Kitchen CALCIUM CARBONATE-VIT  D-MIN 600-400 MG-UNIT PO TABS Oral Take 1 tablet by mouth daily.      . CYANOCOBALAMIN 1000 MCG/ML IJ SOLN Intramuscular Inject 1,000 mcg into the muscle every 30 (thirty) days.      Marland Kitchen DILTIAZEM HCL ER COATED BEADS 240 MG PO CP24 Oral Take 1 capsule (240 mg total) by mouth daily. 30 capsule 6  . FUROSEMIDE 40 MG PO TABS Oral Take 1 tablet (40 mg total) by mouth 2 (two) times daily. 60 tablet 6  . LACTASE 3000 UNITS PO TABS Oral Take 1 tablet by mouth daily.      Marland Kitchen METOPROLOL SUCCINATE ER 25 MG PO TB24  TAKE ONE TABLET BY MOUTH DAILY 30 tablet 11  . OSTEO BI-FLEX ADV TRIPLE ST PO TABS Oral Take 1 tablet by mouth daily.      Marland Kitchen ONE-DAILY MULTI VITAMINS PO TABS Oral Take 1 tablet by mouth daily.      Marland Kitchen PRAVASTATIN SODIUM 20 MG PO TABS  TAKE 1 TABLET BY MOUTH AT BEDTIME 30 tablet 1  . ALIGN 4 MG PO CAPS Oral Take 1 capsule by mouth daily.      Frazier Butt BALANCE OP Both Eyes Place 1 drop into both eyes 2 (two) times daily.      Marland Kitchen SYNTHROID 75 MCG PO TABS  TAKE 1 TABLET BY MOUTH EVERY DAY 30 tablet 2    BP 155/99  Pulse 104  Temp 98.7 F (37.1 C)  Resp 22  SpO2 95%  Physical Exam  Nursing note and vitals reviewed. Constitutional: She is oriented to person, place, and time. She appears well-developed and well-nourished.  Non-toxic appearance. No distress.  HENT:  Head: Normocephalic and atraumatic.  Eyes: Conjunctivae, EOM and lids are normal. Pupils are equal, round, and reactive to light.  Neck: Normal range of motion. Neck supple. No tracheal deviation present. No mass present.  Cardiovascular: Normal rate, regular rhythm and normal heart sounds.  Exam reveals no gallop.   No murmur heard. Pulmonary/Chest: Effort normal and breath sounds normal. No stridor. No respiratory distress. She has no decreased breath sounds. She has no wheezes. She has no rhonchi. She has no rales.  Abdominal: Soft. Normal appearance and bowel sounds are normal. She exhibits no distension. There is no tenderness.  There is no rebound and no CVA tenderness.  Musculoskeletal: Normal range of motion. She exhibits no edema and no tenderness.  Neurological: She is alert and oriented to person, place, and time. She displays no tremor. A sensory deficit is present. No cranial nerve deficit. Coordination abnormal. GCS eye subscore is 4. GCS verbal subscore is 5. GCS motor subscore is 6.  Right upper extremity strength is 3/5, dysmetria noted on the right arm. No facial asymmetry, speech normal.  Skin: Skin is warm and dry. No abrasion and no rash noted.  Psychiatric: She has a normal mood and affect. Her speech is normal and behavior is normal.    ED Course  Procedures (including critical care time)   Labs Reviewed  CBC  DIFFERENTIAL  COMPREHENSIVE METABOLIC PANEL  PROTIME-INR  APTT  URINALYSIS, ROUTINE W REFLEX MICROSCOPIC  I-STAT TROPONIN I   No results found.   No diagnosis found.    MDM   Date: 02/09/2011  Rate: 109  Rhythm: atrial fibrillation  QRS Axis: normal  Intervals: normal  ST/T Wave abnormalities: nonspecific ST changes  Conduction Disutrbances:atrial fib  Narrative Interpretation:   Old EKG Reviewed: unchanged  1:17 PM Patient is not a candidate for TPA as her symptoms began yesterday. I spoke with [facility] hospitalist and patient will be admitted for evaluation of her stroke        Toy Baker, MD 02/09/11 1317

## 2011-02-09 NOTE — ED Notes (Signed)
RN unable to take report at this time. Will return call in 10 minutes.

## 2011-02-09 NOTE — ED Notes (Signed)
Pt to department from [facility] with right arm weakness that started around 2130 last night. Bp- 162/112 Hr-90 A-fib. No neuro deficits present per EMS.

## 2011-02-10 LAB — LIPID PANEL
HDL: 57 mg/dL (ref >39)
LDL Cholesterol: 85 mg/dL (ref 0–99)
Triglycerides: 99 mg/dL (ref <150)
VLDL: 20 mg/dL (ref 0–40)

## 2011-02-10 LAB — HEMOGLOBIN A1C: Hgb A1c MFr Bld: 5.4 % (ref <5.7)

## 2011-02-10 MED ORDER — ROSUVASTATIN CALCIUM 10 MG PO TABS
10.0000 mg | ORAL_TABLET | Freq: Every day | ORAL | Status: DC
  Administered 2011-02-10 – 2011-02-12 (×3): 10 mg via ORAL
  Filled 2011-02-10 (×4): qty 1

## 2011-02-10 MED ORDER — ZOLPIDEM TARTRATE 5 MG PO TABS
5.0000 mg | ORAL_TABLET | Freq: Once | ORAL | Status: AC
  Administered 2011-02-10: 5 mg via ORAL
  Filled 2011-02-10: qty 1

## 2011-02-10 NOTE — Plan of Care (Signed)
Problem: Phase I Progression Outcomes Goal: Antithrombotic given by end of Day 2 Outcome: Not Applicable Date Met:  02/10/11 No Coumadin or ASA due hx of SAH.

## 2011-02-10 NOTE — Telephone Encounter (Signed)
Please call Pt To see how she is and make f/u--- if not in hospital

## 2011-02-10 NOTE — Progress Notes (Addendum)
Occupational Therapy Evaluation Patient Details Name: [name] MRN: [id number] DOB: 01/06/1919 Today's Date: 02/10/2011 Time in: 14:00 Time out: 14:28 Eval II  Problem List:  Patient Active Problem List  Diagnoses  . SHINGLES  . TINEA CORPORIS  . BENIGN NEOPLASM OF ADRENAL GLAND  . HYPOTHYROIDISM  . HYPERLIPIDEMIA  . ANEMIA, B12 DEFICIENCY  . UNSPECIFIED THROMBOCYTOPENIA  . MACULAR DEGENERATION, EARLY  . MITRAL REGURGITATION  . HYPERTENSION  . AORTIC INSUFFICIENCY  . ATRIAL FIBRILLATION WITH RAPID VENTRICULAR RESPONSE  . SUBARACHNOID HEMORRHAGE  . ANEURYSM, THORACIC AORTIC  . COPD  . GERD  . DIVERTICULITIS, ACUTE  . RENAL INSUFFICIENCY  . RENAL CYST  . BREAST CYST, LEFT  . OSTEOARTHRITIS  . PAIN IN THORACIC SPINE  . BACK PAIN  . MYALGIA  . OSTEOPENIA  . DIZZINESS  . EDEMA  . HEADACHE  . SHORTNESS OF BREATH  . DYSPNEA/SHORTNESS OF BREATH  . CHEST PAIN  . URINARY INCONTINENCE  . Nonspecific (abnormal) findings on radiological and other examination of body structure  . HIP FRACTURE, RIGHT  . CERVICAL CANCER, HX OF  . Personal History of Venous Thrombosis and Embolism  . FOOT SURGERY, HX OF  . CT, CHEST, ABNORMAL  . BRUISE  . Hypothyroidism  . COPD (chronic obstructive pulmonary disease)  . Unspecified pleural effusion  . Subarachnoid hemorrhage  . Hypertension  . Hyperlipidemia  . GERD (gastroesophageal reflux disease)  . Aortic insufficiency  . Mitral regurgitation  . Aortic root dilatation  . Dizziness  . Anemia  . Chronic kidney disease  . Pulmonary embolism  . Campath-induced atrial fibrillation  . Shingles  . Campath-induced atrial fibrillation  . Congestive heart failure, unspecified  . Ejection fraction  . Shortness of breath  . Chronic diarrhea  . CVA (cerebral infarction)  . Hemiplegia affecting dominant side    Past Medical History:  Past Medical History  Diagnosis Date  . Osteopenia   . History of cervical cancer   .  Benign neoplasm of adrenal gland   . Hypothyroidism   . COPD (chronic obstructive pulmonary disease)   . Esophageal reflux   . Osteoarthrosis, unspecified whether generalized or localized, unspecified site   . Unspecified pleural effusion   . Subarachnoid hemorrhage     No coumadin or ASA-Dr.Poole Neurosurgery.Marland Kitchenaneurysm..no treatment  . Hypertension   . Hyperlipidemia   . Personal history of venous thrombosis and embolism     No coumadin because of Subarachnoid hemorrhage  . Unspecified disorder resulting from impaired renal function   . Congestive heart failure, unspecified     EF 55-60%, echo, March, 2011  . GERD (gastroesophageal reflux disease)   . Aortic insufficiency     Mild, echo, March, 2011  . Mitral regurgitation     Mild, echo, March, 2011  . Aortic root dilatation     4.5 cm echo, November, 2010 / no mention  . Dizziness   . Anemia   . Chronic kidney disease   . Pulmonary embolism     Also DVT but no Coumadin because of subarachnoid hemorrhage  . Campath-induced atrial fibrillation     cannot take Coumadin /  Holter May, 2011.. mild bradycardia digoxin stopped / September, 2011, shortness of breath and palpitations.. digoxin resume /  March, 2012 can't find it digoxin on med list... to be restarted  . Shingles     November, 2011  . Ejection fraction     55-60%, echo, March, 2011  . Shortness of breath  Nuclear 07/11/2010,,73%, no ischemia  . Renal insufficiency   . Macular degeneration (senile) of retina, unspecified   . Myalgia and myositis, unspecified   . B12 deficiency anemia   . Pulmonary embolism    Past Surgical History:  Past Surgical History  Procedure Date  . Foot surgery   . Cataract extraction   . Cholecystectomy   . Pulmonary embolism surgery     vena cava filter  . Orif hip fracture     07/2007  . Epidural block injection     11/2009  . Abdominal hysterectomy   . Appendectomy     OT Assessment/Plan/Recommendation OT  Assessment Clinical Impression Statement: Pt is very motivated to participate in therapy and do whatever she can to improve her independence with ADL. Will benefit from OT services to facilitate increased independence with ADL for next venue of care. OT Recommendation/Assessment: Patient will need skilled OT in the acute care venue OT Problem List: Decreased strength;Decreased knowledge of use of DME or AE;Impaired UE functional use;Impaired sensation;Decreased activity tolerance OT Therapy Diagnosis : Generalized weakness;Hemiplegia dominant side OT Plan OT Frequency: Min 2X/week OT Treatment/Interventions: Self-care/ADL training;Therapeutic activities;Therapeutic exercise;Neuromuscular education;DME and/or AE instruction;Patient/family education OT Recommendation Follow Up Recommendations: Inpatient Rehab Equipment Recommended: Defer to next venue Individuals Consulted Consulted and Agree with Results and Recommendations: Patient OT Goals Acute Rehab OT Goals OT Goal Formulation: With patient Time For Goal Achievement: 2 weeks ADL Goals Pt Will Perform Eating: with min assist;with adaptive utensils (not using left hand to assist in raising arm) ADL Goal: Eating - Progress: Progressing toward goals Pt Will Perform Grooming: with min assist;Standing at sink;Other (comment);with adaptive equipment (without using left arm to assist in raising right arm) ADL Goal: Grooming - Progress: Progressing toward goals Pt Will Perform Upper Body Bathing: with supervision;Unsupported;Sitting, edge of bed;Sitting, chair ADL Goal: Upper Body Bathing - Progress: Progressing toward goals Pt Will Perform Lower Body Bathing: with supervision;Sit to stand from chair;Sit to stand from bed ADL Goal: Lower Body Bathing - Progress: Progressing toward goals Pt Will Perform Upper Body Dressing: with supervision;Unsupported;Sitting, bed;Sitting, chair ADL Goal: Upper Body Dressing - Progress: Progressing toward  goals Pt Will Perform Lower Body Dressing: with supervision;Sit to stand from bed;Sit to stand from chair ADL Goal: Lower Body Dressing - Progress: Progressing toward goals Pt Will Transfer to Toilet: with supervision;Ambulation;with DME;3-in-1;Other (comment) (quad cane) ADL Goal: Toilet Transfer - Progress: Progressing toward goals Pt Will Perform Toileting - Clothing Manipulation: with supervision;Standing ADL Goal: Toileting - Clothing Manipulation - Progress: Progressing toward goals Pt Will Perform Toileting - Hygiene: with supervision;Sit to stand from 3-in-1/toilet ADL Goal: Toileting - Hygiene - Progress: Progressing toward goals Additional ADL Goal #1: Pt will incorporate use of R UE  independently during 2 ADL tasks. ADL Goal: Additional Goal #1 - Progress: Progressing toward goals Arm Goals Pt Will Perform AROM: Independently;Right upper extremity;2 sets;10 reps;Other (comment) (AROM for all joints including shoulder, elbow, wrist, hand ) Arm Goal: AROM - Progress: Progressing toward goal  OT Evaluation Precautions/Restrictions  Precautions Precautions: Fall Restrictions Weight Bearing Restrictions: No Prior Functioning Home Living Lives With: Alone Type of Home: Apartment Home Layout: One level Home Access: Level entry Bathroom Shower/Tub: Engineer, manufacturing systems: Standard Home Adaptive Equipment: Shower chair with back;Grab bars in shower;Grab bars around toilet;Walker - rolling;Quad cane Prior Function Level of Independence: Independent with basic ADLs;Independent with transfers;Independent with gait;Independent with homemaking with ambulation Driving: Yes (local to her apt) ADL ADL Eating/Feeding: Minimal assistance Eating/Feeding  Details (indicate cue type and reason): pt uses left hand to help lift right UE and guide utensil to mouth but OT provided min assist to stabilize utensil in hand due to decreased grip strength. Where Assessed - Eating/Feeding:  Edge of bed Grooming: Performed;Teeth care;Brushing hair;Moderate assistance Grooming Details (indicate cue type and reason): HOH to brush hair using right hand to help maintain grasp on the brush. Pt used left hand to help lift right UE to brush teeth and OT provided assist again to maintain grasp on toothbrush.  Where Assessed - Grooming: Standing at sink Upper Body Bathing: Simulated;Minimal assistance Where Assessed - Upper Body Bathing: Sitting, bed;Unsupported Lower Body Bathing: Simulated;Minimal assistance Where Assessed - Lower Body Bathing: Sit to stand from bed Upper Body Dressing: Simulated;Minimal assistance Where Assessed - Upper Body Dressing: Sitting, bed;Unsupported Lower Body Dressing: Simulated;Moderate assistance Lower Body Dressing Details (indicate cue type and reason): OT helped with positioning right hand on sock for pt to try using right hand to help pull on sock/stabilize.  Where Assessed - Lower Body Dressing: Sit to stand from bed Toilet Transfer: Simulated;Minimal assistance;Other (comment) (quad cane) Toilet Transfer Method: Ambulating Toileting - Clothing Manipulation: Simulated;Minimal assistance Where Assessed - Toileting Clothing Manipulation: Standing Toileting - Hygiene: Minimal assistance;Simulated Where Assessed - Toileting Hygiene: Standing Tub/Shower Transfer: Not assessed Tub/Shower Transfer Method: Not assessed Equipment Used: Other (comment) (quad cane) ADL Comments: Pt is very motivated and wants to go to CIR. Encouraged her to use right UE as much as possible and educated her on ways to incorporate UE into ADL. Washcloth placed in hand to work on increasing R hand grasp. Pt unable to currently grasp a therap ball.  Also demonstrated SROM for right upper extremity especially for shoulder flexion to keep flexibility in joint.  Vision/Perception  Vision - History Baseline Vision: Wears glasses only for reading Patient Visual Report: No change from  baseline Cognition Cognition Arousal/Alertness: Awake/alert Overall Cognitive Status: Appears within functional limits for tasks assessed Orientation Level: Oriented X4 Sensation/Coordination Sensation Light Touch: Impaired Detail Light Touch Impaired Details: Impaired RUE (pt states numbness from proximal of wrist to fingertips.) Proprioception: Appears Intact Extremity Assessment RUE Assessment RUE Assessment: Exceptions to [facility] RUE Strength RUE Overall Strength: Deficits RUE Overall Strength Comments: <3/5 for grasp, 3-/5 wrist extension, minimal wrist flexion (2+/5), elbow flexion/extension 3+/5 and shoulder flexion/abduction 3-/5 LUE Assessment LUE Assessment: Within Functional Limits Mobility  Transfers Sit to Stand: 5: Supervision;From bed Stand to Sit: 5: Supervision;To bed Exercises   End of Session OT - End of Session Equipment Utilized During Treatment: Gait belt;Other (comment) (quad cane) Activity Tolerance: Patient tolerated treatment well Patient left: in bed;with call bell in reach General Behavior During Session: [facility] for tasks performed Cognition: [facility] for tasks performed   Lennox Laity [phone] 02/10/2011, 3:44 PM

## 2011-02-10 NOTE — Progress Notes (Signed)
Speech Language/Pathology Speech Language Pathology Evaluation  Problem List:  Patient Active Problem List  Diagnoses  . SHINGLES  . TINEA CORPORIS  . BENIGN NEOPLASM OF ADRENAL GLAND  . HYPOTHYROIDISM  . HYPERLIPIDEMIA  . ANEMIA, B12 DEFICIENCY  . UNSPECIFIED THROMBOCYTOPENIA  . MACULAR DEGENERATION, EARLY  . MITRAL REGURGITATION  . HYPERTENSION  . AORTIC INSUFFICIENCY  . ATRIAL FIBRILLATION WITH RAPID VENTRICULAR RESPONSE  . SUBARACHNOID HEMORRHAGE  . ANEURYSM, THORACIC AORTIC  . COPD  . GERD  . DIVERTICULITIS, ACUTE  . RENAL INSUFFICIENCY  . RENAL CYST  . BREAST CYST, LEFT  . OSTEOARTHRITIS  . PAIN IN THORACIC SPINE  . BACK PAIN  . MYALGIA  . OSTEOPENIA  . DIZZINESS  . EDEMA  . HEADACHE  . SHORTNESS OF BREATH  . DYSPNEA/SHORTNESS OF BREATH  . CHEST PAIN  . URINARY INCONTINENCE  . Nonspecific (abnormal) findings on radiological and other examination of body structure  . HIP FRACTURE, RIGHT  . CERVICAL CANCER, HX OF  . Personal History of Venous Thrombosis and Embolism  . FOOT SURGERY, HX OF  . CT, CHEST, ABNORMAL  . BRUISE  . Hypothyroidism  . COPD (chronic obstructive pulmonary disease)  . Unspecified pleural effusion  . Subarachnoid hemorrhage  . Hypertension  . Hyperlipidemia  . GERD (gastroesophageal reflux disease)  . Aortic insufficiency  . Mitral regurgitation  . Aortic root dilatation  . Dizziness  . Anemia  . Chronic kidney disease  . Pulmonary embolism  . Campath-induced atrial fibrillation  . Shingles  . Campath-induced atrial fibrillation  . Congestive heart failure, unspecified  . Ejection fraction  . Shortness of breath  . Chronic diarrhea  . CVA (cerebral infarction)  . Hemiplegia affecting dominant side   Past Medical History:  Past Medical History  Diagnosis Date  . Osteopenia   . History of cervical cancer   . Benign neoplasm of adrenal gland   . Hypothyroidism   . COPD (chronic obstructive pulmonary disease)   .  Esophageal reflux   . Osteoarthrosis, unspecified whether generalized or localized, unspecified site   . Unspecified pleural effusion   . Subarachnoid hemorrhage     No coumadin or ASA-Dr.Poole Neurosurgery.Marland Kitchenaneurysm..no treatment  . Hypertension   . Hyperlipidemia   . Personal history of venous thrombosis and embolism     No coumadin because of Subarachnoid hemorrhage  . Unspecified disorder resulting from impaired renal function   . Congestive heart failure, unspecified     EF 55-60%, echo, March, 2011  . GERD (gastroesophageal reflux disease)   . Aortic insufficiency     Mild, echo, March, 2011  . Mitral regurgitation     Mild, echo, March, 2011  . Aortic root dilatation     4.5 cm echo, November, 2010 / no mention  . Dizziness   . Anemia   . Chronic kidney disease   . Pulmonary embolism     Also DVT but no Coumadin because of subarachnoid hemorrhage  . Campath-induced atrial fibrillation     cannot take Coumadin /  Holter May, 2011.. mild bradycardia digoxin stopped / September, 2011, shortness of breath and palpitations.. digoxin resume /  March, 2012 can't find it digoxin on med list... to be restarted  . Shingles     November, 2011  . Ejection fraction     55-60%, echo, March, 2011  . Shortness of breath     Nuclear 07/11/2010,,73%, no ischemia  . Renal insufficiency   . Macular degeneration (senile) of retina, unspecified   .  Myalgia and myositis, unspecified   . B12 deficiency anemia   . Pulmonary embolism    Past Surgical History:  Past Surgical History  Procedure Date  . Foot surgery   . Cataract extraction   . Cholecystectomy   . Pulmonary embolism surgery     vena cava filter  . Orif hip fracture     07/2007  . Epidural block injection     11/2009  . Abdominal hysterectomy   . Appendectomy     SLP Assessment/Plan/Recommendation  Clinical Impression: Youthful 78 year-old female admitted with acute left parietal CVA.  Pt with normal  speech/language/cognition.  No SLP f/u is indicated. SLP Recommendation/Assessment: Patient does not need any further Speech Language Pathology Services No Skilled Speech Therapy: Patient at baseline level of functioning   SLP Goals N/A  Etna Forquer L. Samson Frederic, [place] CCC/SLP Pager [phone]  Blenda Mounts Laurice 02/10/2011, 2:13 PM

## 2011-02-10 NOTE — Progress Notes (Signed)
Subjective: She is still having weakness in her left upper extremity which she perceives as being unchanged since the initial event. However she is alert and oriented x3 and able to give details of the events leading up to this. Objective: Filed Vitals:   02/09/11 1754 02/09/11 2000 02/09/11 2200 02/10/11 0200  BP: 146/70 130/73 147/66 147/87  Pulse: 75 73 87 57  Temp: 98.8 F (37.1 C) 98.6 F (37 C) 98 F (36.7 C) 98.5 F (36.9 C)  TempSrc: Oral     Resp: 16 18 18 18   Height:      Weight:      SpO2: 93% 93% 90% 93%   Weight change:   Intake/Output Summary (Last 24 hours) at 02/10/11 0848 Last data filed at 02/09/11 1700  Gross per 24 hour  Intake    480 ml  Output      0 ml  Net    480 ml    General: Alert, awake, oriented x3, in no acute distress.  HEENT: [place]/AT PEERL, EOMI Neck: Trachea midline,  no masses, no thyromegal,y no JVD, no carotid bruit OROPHARYNX:  Moist, No exudate/ erythema/lesions.  Heart: Regular rate and rhythm, without murmurs, rubs, gallops, PMI non-displaced, no heaves or thrills on palpation.  Lungs: Clear to auscultation, no wheezing or rhonchi noted. No increased vocal fremitus resonant to percussion  Abdomen: Soft, nontender, nondistended, positive bowel sounds, no masses no hepatosplenomegaly noted..  Neuro: Strength 3-/5 in the right upper extremity, and 4-/5 in right  lower extremity. Musculoskeletal: No warm swelling or erythema around joints, no spinal tenderness noted. Psychiatric: Patient alert and oriented x3, good insight and cognition, good recent to remote recall. Lymph node survey: No cervical axillary or inguinal lymphadenopathy noted.     Lab Results:  [facility] 02/09/11 1127  NA 143  K 4.1  CL 105  CO2 29  GLUCOSE 91  BUN 24*  CREATININE 1.31*  CALCIUM 9.5  MG --  PHOS --    Basename 02/09/11 1127  AST 27  ALT 13  ALKPHOS 71  BILITOT 0.4  PROT 7.7  ALBUMIN 3.8   No results found for this basename:  LIPASE:2,AMYLASE:2 in the last 72 hours  Basename 02/09/11 1127  WBC 4.9  NEUTROABS 3.2  HGB 12.5  HCT 39.1  MCV 99.7  PLT 131*   No results found for this basename: CKTOTAL:3,CKMB:3,CKMBINDEX:3,TROPONINI:3 in the last 72 hours No components found with this basename: POCBNP:3 No results found for this basename: DDIMER:2 in the last 72 hours No results found for this basename: HGBA1C:2 in the last 72 hours No results found for this basename: CHOL:2,HDL:2,LDLCALC:2,TRIG:2,CHOLHDL:2,LDLDIRECT:2 in the last 72 hours No results found for this basename: TSH,T4TOTAL,FREET3,T3FREE,THYROIDAB in the last 72 hours No results found for this basename: VITAMINB12:2,FOLATE:2,FERRITIN:2,TIBC:2,IRON:2,RETICCTPCT:2 in the last 72 hours  Micro Results: No results found for this or any previous visit (from the past 240 hour(s)).  Studies/Results: Dg Chest 2 View  02/09/2011  *RADIOLOGY REPORT*  Clinical Data: Stroke.  Hypertension.  CHF.  COPD.  Pneumonia. Renal insufficiency.  CHEST - 2 VIEW  Comparison: 11/27/2010  Findings: IVC filter error. Lateral view degraded by patient arm position.  Mild S-shaped thoracolumbar spine curvature.  Patient rotated minimally right.  Moderate cardiomegaly with a tortuous thoracic aorta.  Trace left pleural fluid is new. No pneumothorax.  No congestive failure.  Mild left base scarring.  IMPRESSION:  1.  New trace left pleural fluid. 2.  Cardiomegaly, without congestive failure.  Original Report Authenticated By:  Consuello Bossier, M.D.   Ct Head Wo Contrast  02/09/2011  *RADIOLOGY REPORT*  Clinical Data: Right arm weakness.  Filling better.  CT HEAD WITHOUT CONTRAST  Technique:  Contiguous axial images were obtained from the base of the skull through the vertex without contrast.  Comparison: 09/09/2007.  Findings: Left parietal lobe small to moderate size infarct is new from the prior CT scan although having an appearance suggesting that this is subacute to remote in origin.   Prominent small vessel disease type changes. No CT evidence of large acute infarct.  Small acute infarct cannot be excluded by CT.  No intracranial mass lesion detected on this unenhanced exam.  No intracranial hemorrhage.  Global atrophy without hydrocephalus.  Vascular calcifications.  IMPRESSION: Subacute to remote left parietal lobe infarct.  Prominent small vessel disease type changes.  Original Report Authenticated By: Fuller Canada, M.D.    Medications: I have reviewed the patient's current medications. Scheduled Meds:   . amLODipine  5 mg Oral Daily  . B-complex with vitamin C  1 tablet Oral Daily  . calcium-vitamin D  1 tablet Oral Daily  . cyanocobalamin  1,000 mcg Intramuscular Q30 days  . diltiazem  240 mg Oral Daily  . Flora-Q  1 capsule Oral Daily  . furosemide  40 mg Oral BID  . Lactase  1 tablet Oral Daily  . levothyroxine  75 mcg Oral QAC breakfast  . metoprolol succinate  25 mg Oral Daily  . mulitivitamin with minerals  1 tablet Oral Daily  . pantoprazole  40 mg Oral Q1200  . polyvinyl alcohol  1 drop Both Eyes BID  . simvastatin  10 mg Oral q1800  . DISCONTD: ALIGN  1 capsule Oral Daily  . DISCONTD: Calcium Carbonate-Vit D-Min  1 tablet Oral Daily  . DISCONTD: lactase  3 tablet Oral Daily  . DISCONTD: multivitamin  1 tablet Oral Daily  . DISCONTD: Osteo Bi-Flex Adv Triple St  1 tablet Oral Daily  . DISCONTD: Propylene Glycol  1 drop Both Eyes BID   Continuous Infusions:  PRN Meds:.senna-docusate Assessment/Plan: Patient Active Hospital Problem List: CVA (cerebral infarction) (02/09/2011)   Assessment: Patient status post CVA in the left parietal territory with resultant right hemiparesthesia's. I'm awaiting physical therapy and occupational therapy evaluation. The patient also need an neurological consult with the stroke team. I suspect this patient will be candidate for inpatient rehabilitation I will defer to recommendations from physical therapy and  occupational therapy . Will discontinue Zocor and start Crestor on this patient .  HYPERLIPIDEMIA (02/15/2006)   Assessment: Lipids well-controlled   HYPERTENSION (02/15/2006)   Assessment: Blood pressures are adequate    Hemiplegia affecting dominant side (02/09/2011)   Assessment: We'll place the CIR consult      LOS: 1 day

## 2011-02-10 NOTE — Progress Notes (Signed)
Physical Therapy Evaluation Patient Details Name: [name] MRN: [id number] DOB: [birth year] Today's Date: 02/10/2011 [id number] EV2  Discharge Recommendations:  Patient will benefit from CIR stay prior to d/c home with intermittent assist.  Problem List:  Patient Active Problem List  Diagnoses  . SHINGLES  . TINEA CORPORIS  . BENIGN NEOPLASM OF ADRENAL GLAND  . HYPOTHYROIDISM  . HYPERLIPIDEMIA  . ANEMIA, B12 DEFICIENCY  . UNSPECIFIED THROMBOCYTOPENIA  . MACULAR DEGENERATION, EARLY  . MITRAL REGURGITATION  . HYPERTENSION  . AORTIC INSUFFICIENCY  . ATRIAL FIBRILLATION WITH RAPID VENTRICULAR RESPONSE  . SUBARACHNOID HEMORRHAGE  . ANEURYSM, THORACIC AORTIC  . COPD  . GERD  . DIVERTICULITIS, ACUTE  . RENAL INSUFFICIENCY  . RENAL CYST  . BREAST CYST, LEFT  . OSTEOARTHRITIS  . PAIN IN THORACIC SPINE  . BACK PAIN  . MYALGIA  . OSTEOPENIA  . DIZZINESS  . EDEMA  . HEADACHE  . SHORTNESS OF BREATH  . DYSPNEA/SHORTNESS OF BREATH  . CHEST PAIN  . URINARY INCONTINENCE  . Nonspecific (abnormal) findings on radiological and other examination of body structure  . HIP FRACTURE, RIGHT  . CERVICAL CANCER, HX OF  . Personal History of Venous Thrombosis and Embolism  . FOOT SURGERY, HX OF  . CT, CHEST, ABNORMAL  . BRUISE  . Hypothyroidism  . COPD (chronic obstructive pulmonary disease)  . Unspecified pleural effusion  . Subarachnoid hemorrhage  . Hypertension  . Hyperlipidemia  . GERD (gastroesophageal reflux disease)  . Aortic insufficiency  . Mitral regurgitation  . Aortic root dilatation  . Dizziness  . Anemia  . Chronic kidney disease  . Pulmonary embolism  . Campath-induced atrial fibrillation  . Shingles  . Campath-induced atrial fibrillation  . Congestive heart failure, unspecified  . Ejection fraction  . Shortness of breath  . Chronic diarrhea  . CVA (cerebral infarction)  . Hemiplegia affecting dominant side    Past Medical History:  Past Medical  History  Diagnosis Date  . Osteopenia   . History of cervical cancer   . Benign neoplasm of adrenal gland   . Hypothyroidism   . COPD (chronic obstructive pulmonary disease)   . Esophageal reflux   . Osteoarthrosis, unspecified whether generalized or localized, unspecified site   . Unspecified pleural effusion   . Subarachnoid hemorrhage     No coumadin or ASA-Dr.[facility].Marland Kitchenaneurysm..no treatment  . Hypertension   . Hyperlipidemia   . Personal history of venous thrombosis and embolism     No coumadin because of Subarachnoid hemorrhage  . Unspecified disorder resulting from impaired renal function   . Congestive heart failure, unspecified     EF 55-60%, echo, March, 2011  . GERD (gastroesophageal reflux disease)   . Aortic insufficiency     Mild, echo, March, 2011  . Mitral regurgitation     Mild, echo, March, 2011  . Aortic root dilatation     4.5 cm echo, November, 2010 / no mention  . Dizziness   . Anemia   . Chronic kidney disease   . Pulmonary embolism     Also DVT but no Coumadin because of subarachnoid hemorrhage  . Campath-induced atrial fibrillation     cannot take Coumadin /  Holter May, 2011.. mild bradycardia digoxin stopped / September, 2011, shortness of breath and palpitations.. digoxin resume /  March, 2012 can't find it digoxin on med list... to be restarted  . Shingles     November, 2011  . Ejection fraction     55-60%,  echo, March, 2011  . Shortness of breath     Nuclear 07/11/2010,,73%, no ischemia  . Renal insufficiency   . Macular degeneration (senile) of retina, unspecified   . Myalgia and myositis, unspecified   . B12 deficiency anemia   . Pulmonary embolism    Past Surgical History:  Past Surgical History  Procedure Date  . Foot surgery   . Cataract extraction   . Cholecystectomy   . Pulmonary embolism surgery     vena cava filter  . Orif hip fracture     07/2007  . Epidural block injection     11/2009  . Abdominal hysterectomy     . Appendectomy     PT Assessment/Plan/Recommendation PT Assessment Clinical Impression Statement: Patient with left parietal CVA presents with decreased balance, decreased right UE strength and proprioception and will benefit from skilled PT to maximize independence and allow return to independent living.  Feel she will be good candidate for inpatient rehab to achieve mod independent goals for d/c home with intermittent assist. PT Recommendation/Assessment: Patient will need skilled PT in the acute care venue PT Problem List: Decreased strength;Decreased balance;Decreased mobility;Decreased knowledge of use of DME;Decreased coordination Barriers to Discharge: Decreased caregiver support PT Therapy Diagnosis : Abnormality of gait;Hemiplegia dominant side PT Plan PT Frequency: Min 4X/week PT Treatment/Interventions: DME instruction;Gait training;Functional mobility training;Therapeutic activities;Therapeutic exercise;Balance training;Patient/family education PT Recommendation Recommendations for Other Services: Rehab consult Follow Up Recommendations: Inpatient Rehab Equipment Recommended: Defer to next venue PT Goals  Acute Rehab PT Goals PT Goal Formulation: With patient Time For Goal Achievement: 2 weeks Pt will go Sit to Stand: with modified independence PT Goal: Sit to Stand - Progress: Not met Pt will go Stand to Sit: with modified independence PT Goal: Stand to Sit - Progress: Not met Pt will Stand: with modified independence;6 - 10 min;with unilateral upper extremity support (during functional activity) PT Goal: Stand - Progress: Not met Pt will Ambulate: >150 feet;with modified independence;with least restrictive assistive device PT Goal: Ambulate - Progress: Not met  PT Evaluation Precautions/Restrictions  Precautions Precautions: Fall Prior Functioning  Home Living Lives With: Alone Type of Home: Apartment Home Layout: One level Home Access: Level entry Bathroom  Shower/Tub: Engineer, manufacturing systems: Standard Home Adaptive Equipment: Shower chair with back;Grab bars in shower;Grab bars around toilet;Walker - rolling;Quad cane Prior Function Level of Independence: Independent with basic ADLs;Independent with transfers;Independent with gait;Independent with homemaking with ambulation Driving: Yes (local to her apt) Cognition Cognition Arousal/Alertness: Awake/alert Overall Cognitive Status: Appears within functional limits for tasks assessed Orientation Level: Oriented X4 Sensation/Coordination Sensation Light Touch: Appears Intact Proprioception: Impaired Detail Proprioception Impaired Details: Impaired RUE Extremity Assessment RUE Assessment RUE Assessment: Exceptions to [facility] RUE Strength RUE Overall Strength: Deficits RUE Overall Strength Comments: see OT note for detail; unable to achieve full grasp, <3/5 wrist flexion and extension, elbow flexion 3+/5, shoulder flexion 3-/5 LUE Assessment LUE Assessment: Within Functional Limits RLE Assessment RLE Assessment: Within Functional Limits LLE Assessment LLE Assessment: Within Functional Limits Mobility (including Balance) Bed Mobility Bed Mobility: Yes Left Sidelying to Sit: 6: Modified independent (Device/Increase time);With rails Sitting - Scoot to Edge of Bed: 6: Modified independent (Device/Increase time) Sit to Supine - Left: 6: Modified independent (Device/Increase time) Transfers Transfers: Yes Sit to Stand: 5: Supervision;From bed Sit to Stand Details (indicate cue type and reason): supervision for safety Stand to Sit: 5: Supervision;To bed Stand to Sit Details: for safety Ambulation/Gait Ambulation/Gait: Yes Ambulation/Gait Assistance: 4: Min assist Ambulation/Gait Assistance  Details (indicate cue type and reason): for balance, widened BOS and increased lateral sway, holding right UE with elbow at hip and palm turned up Ambulation Distance (Feet): 120 Feet Assistive  device: Small based quad cane  Posture/Postural Control Posture/Postural Control: Postural limitations Postural Limitations: kyphotic with forward head and rounded shoulders Exercise    End of Session PT - End of Session Equipment Utilized During Treatment: Gait belt Activity Tolerance: Patient tolerated treatment well Patient left: in bed;with call bell in reach General Behavior During Session: [facility] for tasks performed Cognition: [facility] for tasks performed  [facility] 02/10/2011, 11:58 AM

## 2011-02-10 NOTE — Progress Notes (Signed)
Bilateral carotid artery duplex completed.  Preliminary report is no evidence of significant ICA stenosis. Smiley Houseman 02/10/2011, 8:41 AM

## 2011-02-11 ENCOUNTER — Inpatient Hospital Stay (HOSPITAL_COMMUNITY): Payer: Medicare Other

## 2011-02-11 DIAGNOSIS — I6330 Cerebral infarction due to thrombosis of unspecified cerebral artery: Secondary | ICD-10-CM

## 2011-02-11 DIAGNOSIS — I359 Nonrheumatic aortic valve disorder, unspecified: Secondary | ICD-10-CM

## 2011-02-11 MED ORDER — CALCIUM CARBONATE-VITAMIN D 500-200 MG-UNIT PO TABS
1.0000 | ORAL_TABLET | Freq: Every day | ORAL | Status: DC
  Administered 2011-02-11 – 2011-02-13 (×3): 1 via ORAL
  Filled 2011-02-11 (×3): qty 1

## 2011-02-11 MED ORDER — ACETAMINOPHEN 80 MG PO CHEW: 500.0000 mg | CHEWABLE_TABLET | Freq: Three times a day (TID) | ORAL | Status: DC | PRN

## 2011-02-11 MED ORDER — ACETAMINOPHEN 160 MG/5ML PO SOLN
520.0000 mg | Freq: Three times a day (TID) | ORAL | Status: DC | PRN
  Administered 2011-02-11: 520 mg via ORAL
  Filled 2011-02-11: qty 20.3

## 2011-02-11 MED ORDER — ZOLPIDEM TARTRATE 5 MG PO TABS
5.0000 mg | ORAL_TABLET | Freq: Once | ORAL | Status: AC
  Administered 2011-02-11: 5 mg via ORAL
  Filled 2011-02-11: qty 1

## 2011-02-11 MED ORDER — ASPIRIN EC 325 MG PO TBEC
325.0000 mg | DELAYED_RELEASE_TABLET | Freq: Every day | ORAL | Status: DC
  Administered 2011-02-11 – 2011-02-13 (×3): 325 mg via ORAL
  Filled 2011-02-11 (×3): qty 1

## 2011-02-11 NOTE — Progress Notes (Addendum)
Physical Therapy Treatment Patient Details Name: NYASIA BAXLEY MRN: [id number] DOB: May 07, 1918 Today's Date: 02/11/2011  PT Assessment/Plan  PT - Assessment/Plan Comments on Treatment Session: Pt progressing with PT goals & moves fairly well.   PT Plan: Discharge plan remains appropriate PT Frequency: Min 4X/week Follow Up Recommendations: Inpatient Rehab Equipment Recommended: Defer to next venue PT Goals  Acute Rehab PT Goals PT Goal: Sit to Stand - Progress: Progressing toward goal PT Goal: Stand to Sit - Progress: Progressing toward goal PT Goal: Stand - Progress: Progressing toward goal PT Goal: Ambulate - Progress: Progressing toward goal  PT Treatment Precautions/Restrictions  Precautions Precautions: Fall Restrictions Weight Bearing Restrictions: No Mobility (including Balance) Bed Mobility Left Sidelying to Sit: 6: Modified independent (Device/Increase time) Sitting - Scoot to Edge of Bed: 6: Modified independent (Device/Increase time) Transfers Sit to Stand: 5: Supervision;From bed Sit to Stand Details (indicate cue type and reason): cues for increased use of RUE  Stand to Sit: 5: Supervision;To bed Stand to Sit Details: Cues for use of RUE Ambulation/Gait Ambulation/Gait Assistance: Other (comment) (Min Guard (A)) Ambulation/Gait Assistance Details (indicate cue type and reason): (A) for balance.  Pt holds RUE close to body with elbow flexed.  Ambulated approx. 100' with small based quad cane but then pt wanted to attempt ambulation without quad cane.  Pt states that she actually felt more comfortable without AD due to not having to worry about coordination of steppage with cane.    Ambulation Distance (Feet): 200 Feet Assistive device: Small based quad cane;None Gait Pattern: Step-through pattern  High Level Balance High Level Balance Activites: Direction changes;Turns;Sudden stops;Head turns, Tandem stance with unilateral UE support to place foot ahead of the  other & maintain balance with Min (A), Single Leg Stance with Min A & bil UE support, external challenge at hips in anterior/posterior/lateral direction, picking object up off floor, standing with eyes closed without UE support.     High Level Balance Comments: No LOB noted with balance activities during gait.  Required UE support for standing balance activities.    Exercise    End of Session PT - End of Session Equipment Utilized During Treatment: Gait belt Activity Tolerance: Patient tolerated treatment well Patient left: Other (comment) (sitting EOB with sister & friend present) General Behavior During Session: [facility] for tasks performed Cognition: [facility] for tasks performed  Lara Mulch 02/11/2011, 3:26 PM [phone]

## 2011-02-11 NOTE — Progress Notes (Deleted)
.  Clinical social worker completed patient psychosocial assessment, please see assessment in patient shadow chart.  CSW advised by medical team to discuss disposition after the holiday. CSW discussed disposition plans with pt daughter as requested by patient, per medical team. Pt daughter adamantly refused skilled nursing placement and requested home health services. Safety concerns and limitations of home health services were discussed and pt daughter aware of risks. Pt daughter still requesting home health. CSW to inform RN Case manager. .No further Clinical Social Work needs, signing off.    Catha Gosselin, Theresia Majors  [phone] .02/11/2011 16:15pm

## 2011-02-11 NOTE — Progress Notes (Signed)
Clinical social worker aware of potential placement for rehab. Pt was recommended for inpatient rehab by physical therapy. CSW awaiting approval from Inpatient rehab after considering physical therapy recommendations.   Catha Gosselin, Theresia Majors  [phone] .02/11/2011 15:51pm

## 2011-02-11 NOTE — Progress Notes (Signed)
Occupational Therapy Treatment Patient Details Name: [name] MRN: [id number] DOB: Apr 30, 1918 Today's Date: 02/11/2011 3:07-3:35  OT Assessment/Plan OT Assessment/Plan Comments on Treatment Session: This 78 year old female making great gains. Very motivated to get her RUE working again. Will continue to follow and she will need continued OT at SNF (rehab said not a candidate). OT Plan: Discharge plan remains appropriate OT Frequency: Min 2X/week Follow Up Recommendations: Inpatient Rehab Equipment Recommended: Defer to next venue OT Goals ADL Goals ADL Goal: Grooming - Progress: Met ADL Goal: Upper Body Dressing - Progress: Progressing toward goals ADL Goal: Toilet Transfer - Progress: Progressing toward goals Arm Goals Arm Goal: AROM - Progress: Progressing toward goal Additional Arm Goal #1: Pt will be S with RUE GM and FM activities. Arm Goal: Additional Goal #1 - Progress: Not met  OT Treatment Precautions/Restrictions  Precautions Precautions: Fall Required Braces or Orthoses: No Restrictions Weight Bearing Restrictions: No   ADL ADL Eating/Feeding Details (indicate cue type and reason): Gave patient two pieces of red foam to build up her utensils tonight for supper to see if that helps her to use her RUE to eat with. Grooming: Performed;Brushing hair;Supervision/safety;Set up Grooming Details (indicate cue type and reason): Min guard A for standing balance. She used her RUE 75% of the time and her LUE 25%. Where Assessed - Grooming: Standing at sink Upper Body Bathing: Not assessed Lower Body Bathing: Not assessed Upper Body Dressing: Performed;Minimal assistance Upper Body Dressing Details (indicate cue type and reason): for her robe while seated. She was able to tie the belt of the robe with increased time using BUEs. Where Assessed - Upper Body Dressing: Sitting, bed Lower Body Dressing: Not assessed Toilet Transfer: Simulated;Other (comment) (minguard  A) Toilet Transfer Details (indicate cue type and reason): Min guard A from bed down hallway and back to sit EOB Toilet Transfer Method: Ambulating Toileting - Clothing Manipulation: Not assessed Toileting - Hygiene: Not assessed Tub/Shower Transfer: Not assessed Tub/Shower Transfer Method: Not assessed Equipment Used:  (None) ADL Comments: Showed pt composite flexion/extension exercises for her RUE using a washcloth. Encouraged her to use her RUE as much as possible. Mobility  Bed Mobility Bed Mobility: Yes Left Sidelying to Sit: 6: Modified independent (Device/Increase time) Supine to Sit: 6: Modified independent (Device/Increase time);HOB flat (no rails, increased time) Sitting - Scoot to Edge of Bed: 7: Independent Sit to Supine - Left: 7: Independent;HOB flat (no rail) Transfers Transfers: Yes Sit to Stand: 5: Supervision;With upper extremity assist;From bed Sit to Stand Details (indicate cue type and reason): cues for increased use of RUE  Stand to Sit: 6: Modified independent (Device/Increase time);With upper extremity assist;To bed Stand to Sit Details: Cues for use of RUE Exercises    End of Session OT - End of Session Equipment Utilized During Treatment: Other (comment) (none) Activity Tolerance: Patient tolerated treatment well;Other (comment) ("I do feel a little tired") Patient left: in bed;with call bell in reach;with bed alarm set General Behavior During Session: [facility] for tasks performed Cognition: [facility] for tasks performed  Evette Georges [phone] 02/11/2011, 3:58 PM

## 2011-02-11 NOTE — Consult Note (Signed)
Physical Medicine and Rehabilitation Consult Reason for Consult: Cerebrovascular accident Referring Phsyician: Dr. Marthann Schiller [name] is an 78 y.o. female.   HPI: 78 year old right-handed female with history of subarachnoid hemorrhage in the past. Admitted December 31 with right-sided weakness. Cranial CT scan showed subacute to remote left parietal lobe infarction. Carotid Dopplers with no evidence of significant internal carotid artery stenosis. Workup currently ongoing. It had been noted that due to history of subarachnoid hemorrhage no Coumadin or aspirin therapy per neurosurgery Dr. Jordan Likes. Patient with chronic renal insufficiency of 1.31 and monitored. Physical therapy evaluation completed on January 1 as patient required minimal assistance to ambulate 120 feet with small-based quad cane. Noted patient uses straight cane prior to admission for balance. Required supervision for safety. Occupational therapy notes minimal assistance for eating and feeding and moderate assist for lower body dressing. Physical therapy and occupational therapy request physical medicine and rehabilitation consult to consider inpatient rehabilitation services. Has MRI pending for today. Review of Systems  Eyes: Negative for double vision.  Respiratory: Negative for cough and shortness of breath.   Cardiovascular: Negative for chest pain.  Gastrointestinal: Positive for constipation. Negative for nausea.  Genitourinary: Negative for dysuria.  Musculoskeletal: Positive for myalgias.  Skin: Negative.   Neurological: Positive for dizziness. Negative for headaches.  Psychiatric/Behavioral: Negative.    Past Medical History  Diagnosis Date  . Osteopenia   . History of cervical cancer   . Benign neoplasm of adrenal gland   . Hypothyroidism   . COPD (chronic obstructive pulmonary disease)   . Esophageal reflux   . Osteoarthrosis, unspecified whether generalized or localized, unspecified site   .  Unspecified pleural effusion   . Subarachnoid hemorrhage     No coumadin or ASA-Dr.Poole Neurosurgery.Marland Kitchenaneurysm..no treatment  . Hypertension   . Hyperlipidemia   . Personal history of venous thrombosis and embolism     No coumadin because of Subarachnoid hemorrhage  . Unspecified disorder resulting from impaired renal function   . Congestive heart failure, unspecified     EF 55-60%, echo, March, 2011  . GERD (gastroesophageal reflux disease)   . Aortic insufficiency     Mild, echo, March, 2011  . Mitral regurgitation     Mild, echo, March, 2011  . Aortic root dilatation     4.5 cm echo, November, 2010 / no mention  . Dizziness   . Anemia   . Chronic kidney disease   . Pulmonary embolism     Also DVT but no Coumadin because of subarachnoid hemorrhage  . Campath-induced atrial fibrillation     cannot take Coumadin /  Holter May, 2011.. mild bradycardia digoxin stopped / September, 2011, shortness of breath and palpitations.. digoxin resume /  March, 2012 can't find it digoxin on med list... to be restarted  . Shingles     November, 2011  . Ejection fraction     55-60%, echo, March, 2011  . Shortness of breath     Nuclear 07/11/2010,,73%, no ischemia  . Renal insufficiency   . Macular degeneration (senile) of retina, unspecified   . Myalgia and myositis, unspecified   . B12 deficiency anemia   . Pulmonary embolism    Past Surgical History  Procedure Date  . Foot surgery   . Cataract extraction   . Cholecystectomy   . Pulmonary embolism surgery     vena cava filter  . Orif hip fracture     07/2007  . Epidural block injection     11/2009  .  Abdominal hysterectomy   . Appendectomy    Family History  Problem Relation Age of Onset  . Aortic aneurysm Brother   . Breast cancer Sister     1st degree relative <50  . Coronary artery disease Mother     1st degree relative <60  . Stroke Father     Female- 1st degree relative <50   Social History:  reports that she has never  smoked. She has never used smokeless tobacco. She reports that she does not drink alcohol or use illicit drugs. Allergies:  Allergies  Allergen Reactions  . Contrast Media (Iodinated Diagnostic Agents) Other (See Comments)    No Contrast due to cerebral aneurysm per patient  . Ciprofloxacin     REACTION: itching , diarrhea  . Hydrocodone-Acetaminophen     REACTION: hallucination   Medications Prior to Admission  Medication Dose Route Frequency Provider Last Rate Last Dose  . amLODipine (NORVASC) tablet 5 mg  5 mg Oral Daily Ava Swayze, DO   5 mg at 02/10/11 1112  . B-complex with vitamin C tablet 1 tablet  1 tablet Oral Daily Ava Swayze, DO   1 tablet at 02/10/11 1110  . calcium-vitamin D (OSCAL WITH D) 250-125 MG-UNIT per tablet 1 tablet  1 tablet Oral Daily Juliette Christine Virginia Crews, PHARMD      . cyanocobalamin ((VITAMIN B-12)) injection 1,000 mcg  1,000 mcg Intramuscular Q30 days Ava Swayze, DO      . diltiazem (CARDIZEM CD) 24 hr capsule 240 mg  240 mg Oral Daily Ava Swayze, DO   240 mg at 02/10/11 1112  . Flora-Q (FLORA-Q) Capsule 1 capsule  1 capsule Oral Daily Juliette Merrily Brittle, [place]   1 capsule at 02/10/11 1108  . furosemide (LASIX) tablet 40 mg  40 mg Oral BID Ava Swayze, DO   40 mg at 02/10/11 1756  . Lactase CHEW 9,000 Units  1 tablet Oral Daily Madolyn Frieze, PHARMD   9,000 Units at 02/10/11 1108  . levothyroxine (SYNTHROID, LEVOTHROID) tablet 75 mcg  75 mcg Oral QAC breakfast Ava Swayze, DO   75 mcg at 02/10/11 0806  . metoprolol succinate (TOPROL-XL) 24 hr tablet 25 mg  25 mg Oral Daily Ava Swayze, DO   25 mg at 02/10/11 1111  . mulitivitamin with minerals tablet 1 tablet  1 tablet Oral Daily Juliette Merrily Brittle, [place]   1 tablet at 02/10/11 1113  . pantoprazole (PROTONIX) EC tablet 40 mg  40 mg Oral Q1200 Ava Swayze, DO   40 mg at 02/10/11 1221  . polyvinyl alcohol (LIQUIFILM TEARS) 1.4 % ophthalmic solution 1 drop  1 drop Both  Eyes BID [address] [place]   1 drop at 02/10/11 2141  . rosuvastatin (CRESTOR) tablet 10 mg  10 mg Oral q1800 Michelle A. Matthews   10 mg at 02/10/11 2140  . senna-docusate (Senokot-S) tablet 1 tablet  1 tablet Oral QHS PRN Ava Swayze, DO      . zolpidem (AMBIEN) tablet 5 mg  5 mg Oral Once Maren Reamer, NP   5 mg at 02/10/11 2227  . DISCONTD: ALIGN CAPS 1 capsule  1 capsule Oral Daily Ava Swayze, DO      . DISCONTD: Calcium Carbonate-Vit D-Min 600-400 MG-UNIT TABS 1 tablet  1 tablet Oral Daily Ava Swayze, DO      . DISCONTD: lactase (LACTAID) tablet 9,000 Units  3 tablet Oral Daily Ava Swayze, DO      . DISCONTD:  multivitamin tablet 1 tablet  1 tablet Oral Daily Ava Swayze, DO      . DISCONTD: Osteo Bi-Flex Adv Triple St TABS 1 tablet  1 tablet Oral Daily Ava Swayze, DO      . DISCONTD: Propylene Glycol 0.6 % SOLN 1 drop  1 drop Both Eyes BID Ava Swayze, DO      . DISCONTD: simvastatin (ZOCOR) tablet 10 mg  10 mg Oral q1800 Ava Swayze, DO   10 mg at 02/10/11 1756   Medications Prior to Admission  Medication Sig Dispense Refill  . amLODipine (NORVASC) 5 MG tablet TAKE 1 TABLET BY MOUTH DAILY  30 tablet  10  . B Complex-C (B-COMPLEX WITH VITAMIN C) tablet Take 1 tablet by mouth daily.        . Calcium Carbonate-Vit D-Min 600-400 MG-UNIT TABS Take 1 tablet by mouth daily.        . cyanocobalamin (,VITAMIN B-12,) 1000 MCG/ML injection Inject 1,000 mcg into the muscle every 30 (thirty) days.        Marland Kitchen diltiazem (CARDIZEM CD) 240 MG 24 hr capsule Take 1 capsule (240 mg total) by mouth daily.  30 capsule  6  . furosemide (LASIX) 40 MG tablet Take 1 tablet (40 mg total) by mouth 2 (two) times daily.  60 tablet  6  . lactase (LACTAID) 3000 UNITS tablet Take 1 tablet by mouth daily.        . metoprolol succinate (TOPROL-XL) 25 MG 24 hr tablet TAKE ONE TABLET BY MOUTH DAILY  30 tablet  11  . Misc Natural Products (OSTEO BI-FLEX ADV TRIPLE ST) TABS Take 1 tablet by mouth daily.          . Multiple Vitamin (MULTIVITAMIN) tablet Take 1 tablet by mouth daily.        . pravastatin (PRAVACHOL) 20 MG tablet TAKE 1 TABLET BY MOUTH AT BEDTIME  30 tablet  1  . Probiotic Product (ALIGN) 4 MG CAPS Take 1 capsule by mouth daily.        Marland Kitchen SYNTHROID 75 MCG tablet TAKE 1 TABLET BY MOUTH EVERY DAY  30 tablet  2    Home: Home Living Lives With: Alone Type of Home: Apartment Home Layout: One level Home Access: Level entry Bathroom Shower/Tub: Engineer, manufacturing systems: Standard Home Adaptive Equipment: Shower chair with back;Grab bars in shower;Grab bars around toilet;Walker - rolling;Quad cane  Functional History: Prior Function Level of Independence: Independent with basic ADLs;Independent with transfers;Independent with gait;Independent with homemaking with ambulation Driving: Yes (local to her apt) Functional Status:  Mobility: Bed Mobility Bed Mobility: Yes Left Sidelying to Sit: 6: Modified independent (Device/Increase time);With rails Sitting - Scoot to Edge of Bed: 6: Modified independent (Device/Increase time) Sit to Supine - Left: 6: Modified independent (Device/Increase time) Transfers Transfers: Yes Sit to Stand: 5: Supervision;From bed Sit to Stand Details (indicate cue type and reason): supervision for safety Stand to Sit: 5: Supervision;To bed Stand to Sit Details: for safety Ambulation/Gait Ambulation/Gait: Yes Ambulation/Gait Assistance: 4: Min assist Ambulation/Gait Assistance Details (indicate cue type and reason): for balance, widened BOS and increased lateral sway, holding right UE with elbow at hip and palm turned up Ambulation Distance (Feet): 120 Feet Assistive device: Small based quad cane    ADL: ADL Eating/Feeding: Minimal assistance Eating/Feeding Details (indicate cue type and reason): pt uses left hand to help lift right UE and guide utensil to mouth but OT provided min assist to stabilize utensil in hand due to decreased grip  strength. Where Assessed - Eating/Feeding: Edge of bed Grooming: Performed;Teeth care;Brushing hair;Moderate assistance Grooming Details (indicate cue type and reason): HOH to brush hair using right hand to help maintain grasp on the brush. Pt used left hand to help lift right UE to brush teeth and OT provided assist again to maintain grasp on toothbrush.  Where Assessed - Grooming: Standing at sink Upper Body Bathing: Simulated;Minimal assistance Where Assessed - Upper Body Bathing: Sitting, bed;Unsupported Lower Body Bathing: Simulated;Minimal assistance Where Assessed - Lower Body Bathing: Sit to stand from bed Upper Body Dressing: Simulated;Minimal assistance Where Assessed - Upper Body Dressing: Sitting, bed;Unsupported Lower Body Dressing: Simulated;Moderate assistance Lower Body Dressing Details (indicate cue type and reason): OT helped with positioning right hand on sock for pt to try using right hand to help pull on sock/stabilize.  Where Assessed - Lower Body Dressing: Sit to stand from bed Toilet Transfer: Simulated;Minimal assistance;Other (comment) (quad cane) Toilet Transfer Method: Ambulating Toileting - Clothing Manipulation: Simulated;Minimal assistance Where Assessed - Toileting Clothing Manipulation: Standing Toileting - Hygiene: Minimal assistance;Simulated Where Assessed - Toileting Hygiene: Standing Tub/Shower Transfer: Not assessed Tub/Shower Transfer Method: Not assessed Equipment Used: Other (comment) (quad cane) ADL Comments: Pt is very motivated and wants to go to CIR. Encouraged her to use right UE as much as possible and educated her on ways to incorporate UE into ADL. Washcloth placed in hand to work on increasing R hand grasp. Pt unable to currently grasp a therap ball.  Also demonstrated SROM for right upper extremity especially for shoulder flexion to keep flexibility in joint.   Cognition: Cognition Overall Cognitive Status: Appears within functional  limits for tasks assessed Arousal/Alertness: Awake/alert Orientation Level: Oriented X4 Attention: Selective Selective Attention: Appears intact Memory: Appears intact Awareness: Appears intact Problem Solving: Appears intact Safety/Judgment: Appears intact Cognition Arousal/Alertness: Awake/alert Overall Cognitive Status: Appears within functional limits for tasks assessed Orientation Level: Oriented X4  Blood pressure 131/67, pulse 75, temperature 98.1 F (36.7 C), temperature source Oral, resp. rate 16, height 5\' 3"  (1.6 m), weight 64.411 kg (142 lb), SpO2 91.00%. Physical Exam  Constitutional: She is oriented to person, place, and time. She appears well-developed.  HENT:  Head: Normocephalic.  Neck: Neck supple. No thyromegaly present.  Cardiovascular: Normal rate and regular rhythm.   Pulmonary/Chest: Effort normal and breath sounds normal. She has no wheezes.  Abdominal: She exhibits no distension. There is no tenderness.  Musculoskeletal: She exhibits no edema.  Neurological: She is alert and oriented to person, place, and time.       Pt is alert and oriented.  No gross  CN deficits.  1/2 sensation over RUE, 2/2 sens RLE. Motor 4/5 RUE with positive pronator drift and diminished FMC.  RLE 4+ to 5/5.  LUE and LLE grossly 5/5.  Cognitively intact.  DTR's 1+  Skin: Skin is warm and dry.  Psychiatric: She has a normal mood and affect.    Results for orders placed during the hospital encounter of 02/09/11 (from the past 24 hour(s))  HEMOGLOBIN A1C     Status: Normal   Collection Time   02/10/11  7:58 AM      Component Value Range   Hemoglobin A1C 5.4  <5.7 (%)   Mean Plasma Glucose 108  <117 (mg/dL)  LIPID PANEL     Status: Normal   Collection Time   02/10/11  7:58 AM      Component Value Range   Cholesterol 162  0 - 200 (mg/dL)   Triglycerides  99  <150 (mg/dL)   HDL 57  >16 (mg/dL)   Total CHOL/HDL Ratio 2.8     VLDL 20  0 - 40 (mg/dL)   LDL Cholesterol 85  0 - 99  (mg/dL)   Dg Chest 2 View  10/96/0454  *RADIOLOGY REPORT*  Clinical Data: Stroke.  Hypertension.  CHF.  COPD.  Pneumonia. Renal insufficiency.  CHEST - 2 VIEW  Comparison: 11/27/2010  Findings: IVC filter error. Lateral view degraded by patient arm position.  Mild S-shaped thoracolumbar spine curvature.  Patient rotated minimally right.  Moderate cardiomegaly with a tortuous thoracic aorta.  Trace left pleural fluid is new. No pneumothorax.  No congestive failure.  Mild left base scarring.  IMPRESSION:  1.  New trace left pleural fluid. 2.  Cardiomegaly, without congestive failure.  Original Report Authenticated By: Consuello Bossier, M.D.   Ct Head Wo Contrast  02/09/2011  *RADIOLOGY REPORT*  Clinical Data: Right arm weakness.  Filling better.  CT HEAD WITHOUT CONTRAST  Technique:  Contiguous axial images were obtained from the base of the skull through the vertex without contrast.  Comparison: 09/09/2007.  Findings: Left parietal lobe small to moderate size infarct is new from the prior CT scan although having an appearance suggesting that this is subacute to remote in origin.  Prominent small vessel disease type changes. No CT evidence of large acute infarct.  Small acute infarct cannot be excluded by CT.  No intracranial mass lesion detected on this unenhanced exam.  No intracranial hemorrhage.  Global atrophy without hydrocephalus.  Vascular calcifications.  IMPRESSION: Subacute to remote left parietal lobe infarct.  Prominent small vessel disease type changes.  Original Report Authenticated By: Fuller Canada, M.D.    Assessment/Plan: Diagnosis: subacute left parietal infarct 1. Does the need for close, 24 hr/day medical supervision in concert with the patient's rehab needs make it unreasonable for this patient to be served in a less intensive setting? Potentially 2. Co-Morbidities requiring supervision/potential complications: htn, copd, hx of PE 3. Due to bladder management, bowel management,  skin/wound care and patient education, does the patient require 24 hr/day rehab nursing? Potentially 4. Does the patient require coordinated care of a physician, rehab nurse, PT, OT, SLP to address physical and functional deficits in the context of the above medical diagnosis(es)? Potentially Addressing deficits in the following areas: balance, bathing, bowel/bladder control, cognition, dressing, endurance, grooming, locomotion, strength, swallowing, toileting and transferring 5. Can the patient actively participate in an intensive therapy program of at least 3 hrs of therapy per day at least 5 days per week? Yes 6. The potential for patient to make measurable gains while on inpatient rehab is fair 7. Anticipated functional outcomes upon discharge from inpatients are: ?supervision across disciplines 8. Estimated rehab length of stay to reach the above functional goals is: ? A week if necessary at all 9. Does the patient have adequate social supports to accommodate these discharge functional goals? Potentially 10. Anticipated D/C setting: Home 11. Anticipated post D/C treatments: HH therapy 12. Overall Rehab/Functional Prognosis: good  RECOMMENDATIONS: This patient's condition is appropriate for continued rehabilitative care in the following setting: SNF Patient has agreed to participate in recommended program. Potentially Note that insurance prior authorization may be required for reimbursement for recommended care.  Comment: Pt has only been in the hospital for 2 + days.  Was minimal assistance 120 feet already with PT yesterday.  She lives alone and is not sure if her "lady" she hires for household work could  provide more assist.  Would like to see therapy progress today, but given the above factors, she most likey will need short term SNF.  I can't justify a CIR admission if we will only improve her from a minimal assistance to supervision functional level.   ZTS  02/11/2011

## 2011-02-11 NOTE — Progress Notes (Signed)
  Echocardiogram 2D Echocardiogram has been performed.  Jorje Guild [facility] 02/11/2011, 11:51 AM

## 2011-02-11 NOTE — Consult Note (Signed)
[facility] CONSULT [facility]     Reason for Consult: stroke NW:[id number]   HPI:    [name] is an 78 y.o. female  has a past medical history of Osteopenia; History of cervical cancer; Benign neoplasm of adrenal gland; Hypothyroidism; COPD (chronic obstructive pulmonary disease); Esophageal reflux; Osteoarthrosis, unspecified whether generalized or localized, unspecified site; Unspecified pleural effusion; Subarachnoid hemorrhage; Hypertension; Hyperlipidemia; Personal history of venous thrombosis and embolism; Unspecified disorder resulting from impaired renal function; Congestive heart failure, unspecified; GERD (gastroesophageal reflux disease); Aortic insufficiency; Mitral regurgitation; Aortic root dilatation; Dizziness; Anemia; Chronic kidney disease; Pulmonary embolism; Campath-induced atrial fibrillation; Shingles; Ejection fraction; Shortness of breath; Renal insufficiency; Macular degeneration (senile) of retina, unspecified; Myalgia and myositis, unspecified; B12 deficiency anemia; and Pulmonary embolism. Who states that at about 8:00pm night before when she had sudden onset of weakness and loss of use of her right hand. She stayed up all night reading and noted no improvement.  Called friend in morning to bring her to hospital.  Still has decreased strength and feeling in right hand.   Past Medical History  Diagnosis Date  . Osteopenia   . History of cervical cancer   . Benign neoplasm of adrenal gland   . Hypothyroidism   . COPD (chronic obstructive pulmonary disease)   . Esophageal reflux   . Osteoarthrosis, unspecified whether generalized or localized, unspecified site   . Unspecified pleural effusion   . Subarachnoid hemorrhage     No coumadin or ASA-Dr.[facility].Marland Kitchenaneurysm..no treatment  . Hypertension   . Hyperlipidemia   . Personal history of venous thrombosis and embolism     No coumadin because of Subarachnoid hemorrhage  .  Unspecified disorder resulting from impaired renal function   . Congestive heart failure, unspecified     EF 55-60%, echo, March, 2011  . GERD (gastroesophageal reflux disease)   . Aortic insufficiency     Mild, echo, March, 2011  . Mitral regurgitation     Mild, echo, March, 2011  . Aortic root dilatation     4.5 cm echo, November, 2010 / no mention  . Dizziness   . Anemia   . Chronic kidney disease   . Pulmonary embolism     Also DVT but no Coumadin because of subarachnoid hemorrhage  . Campath-induced atrial fibrillation     cannot take Coumadin /  Holter May, 2011.. mild bradycardia digoxin stopped / September, 2011, shortness of breath and palpitations.. digoxin resume /  March, 2012 can't find it digoxin on med list... to be restarted  . Shingles     November, 2011  . Ejection fraction     55-60%, echo, March, 2011  . Shortness of breath     Nuclear 07/11/2010,,73%, no ischemia  . Renal insufficiency   . Macular degeneration (senile) of retina, unspecified   . Myalgia and myositis, unspecified   . B12 deficiency anemia   . Pulmonary embolism     Past Surgical History  Procedure Date  . Foot surgery   . Cataract extraction   . Cholecystectomy   . Pulmonary embolism surgery     vena cava filter  . Orif hip fracture     07/2007  . Epidural block injection     11/2009  . Abdominal hysterectomy   . Appendectomy     Family History  Problem Relation Age of Onset  . Aortic  aneurysm Brother   . Breast cancer Sister     1st degree relative <50  . Coronary artery disease Mother     1st degree relative <60  . Stroke Father     Female- 1st degree relative <50    Social History:  reports that she has never smoked. She has never used smokeless tobacco. She reports that she does not drink alcohol or use illicit drugs.  Allergies  Allergen Reactions  . Contrast Media (Iodinated Diagnostic Agents) Other (See Comments)    No Contrast due to cerebral aneurysm per patient  .  Ciprofloxacin     REACTION: itching , diarrhea  . Hydrocodone-Acetaminophen     REACTION: hallucination    Medications:    Prior to Admission:  Prescriptions prior to admission  Medication Sig Dispense Refill  . amLODipine (NORVASC) 5 MG tablet TAKE 1 TABLET BY MOUTH DAILY  30 tablet  10  . B Complex-C (B-COMPLEX WITH VITAMIN C) tablet Take 1 tablet by mouth daily.        . Calcium Carbonate-Vit D-Min 600-400 MG-UNIT TABS Take 1 tablet by mouth daily.        . cyanocobalamin (,VITAMIN B-12,) 1000 MCG/ML injection Inject 1,000 mcg into the muscle every 30 (thirty) days.        Marland Kitchen diltiazem (CARDIZEM CD) 240 MG 24 hr capsule Take 1 capsule (240 mg total) by mouth daily.  30 capsule  6  . furosemide (LASIX) 40 MG tablet Take 1 tablet (40 mg total) by mouth 2 (two) times daily.  60 tablet  6  . lactase (LACTAID) 3000 UNITS tablet Take 1 tablet by mouth daily.        . metoprolol succinate (TOPROL-XL) 25 MG 24 hr tablet TAKE ONE TABLET BY MOUTH DAILY  30 tablet  11  . Misc Natural Products (OSTEO BI-FLEX ADV TRIPLE ST) TABS Take 1 tablet by mouth daily.        . Multiple Vitamin (MULTIVITAMIN) tablet Take 1 tablet by mouth daily.        . pravastatin (PRAVACHOL) 20 MG tablet TAKE 1 TABLET BY MOUTH AT BEDTIME  30 tablet  1  . Probiotic Product (ALIGN) 4 MG CAPS Take 1 capsule by mouth daily.        Marland Kitchen Propylene Glycol (SYSTANE BALANCE OP) Place 1 drop into both eyes 2 (two) times daily.        Marland Kitchen SYNTHROID 75 MCG tablet TAKE 1 TABLET BY MOUTH EVERY DAY  30 tablet  2   Scheduled:   . amLODipine  5 mg Oral Daily  . aspirin EC  325 mg Oral Daily  . B-complex with vitamin C  1 tablet Oral Daily  . calcium-vitamin D  1 tablet Oral Daily  . calcium-vitamin D  1 tablet Oral Daily  . cyanocobalamin  1,000 mcg Intramuscular Q30 days  . diltiazem  240 mg Oral Daily  . Flora-Q  1 capsule Oral Daily  . furosemide  40 mg Oral BID  . Lactase  1 tablet Oral Daily  . levothyroxine  75 mcg Oral QAC  breakfast  . metoprolol succinate  25 mg Oral Daily  . mulitivitamin with minerals  1 tablet Oral Daily  . pantoprazole  40 mg Oral Q1200  . polyvinyl alcohol  1 drop Both Eyes BID  . rosuvastatin  10 mg Oral q1800  . zolpidem  5 mg Oral Once  . DISCONTD: simvastatin  10 mg Oral q1800    Review of  Systems - General ROS: negative for - chills, fatigue, fever or hot flashes Hematological and Lymphatic ROS: negative for - bruising, fatigue, jaundice or pallor Endocrine ROS: negative for - hair pattern changes, hot flashes, mood swings or skin changes Respiratory ROS: negative for - cough, hemoptysis, orthopnea or wheezing Cardiovascular ROS: negative for - dyspnea on exertion, orthopnea, palpitations or shortness of breath Gastrointestinal ROS: negative for - abdominal pain, appetite loss, blood in stools, diarrhea or hematemesis Musculoskeletal ROS: negative for - joint pain, joint stiffness, joint swelling or muscle pain Neurological ROS: see HPI Dermatological ROS: negative for dry skin, pruritus and rash   Blood pressure 124/79, pulse 77, temperature 97.6 F (36.4 C), temperature source Oral, resp. rate 16, height 5\' 3"  (1.6 m), weight 64.411 kg (142 lb), SpO2 93.00%.   Neurologic Examination:   Mental Status: Alert, oriented, thought content appropriate.  Speech fluent without evidence of aphasia. Able to follow 3 step commands without difficulty. Cranial Nerves: II-Visual fields grossly intact. III/IV/VI-Extraocular movements intact.  Pupils reactive bilaterally. V/VII-Smile symmetric VIII-grossly intact IX/X-normal gag XI-bilateral shoulder shrug XII-midline tongue extension Motor: 5/5LUE and Bil LE with normal tone and bulk. Positive drift left UE and weakness in shoulder abduction wrist extension and flexion. Decreased Right grip. Sensory: Pinprick and light touch intact throughout,except decreased over her Right forearm dn hand Deep Tendon Reflexes: 2+ and symmetric  throughout Plantars: Downgoing bilaterally Cerebellar: Normal finger-to-nose, normal rapid alternating movements and normal heel-to-shin test.   Lab Results  Component Value Date/Time   CHOL 162 02/10/2011  7:58 AM    Results for orders placed during the hospital encounter of 02/09/11 (from the past 48 hour(s))  URINALYSIS, ROUTINE W REFLEX MICROSCOPIC     Status: Abnormal   Collection Time   02/09/11  1:53 PM      Component Value Range Comment   Color, Urine YELLOW  YELLOW     APPearance CLEAR  CLEAR     Specific Gravity, Urine 1.012  1.005 - 1.030     pH 7.0  5.0 - 8.0     Glucose, UA NEGATIVE  NEGATIVE (mg/dL)    Hgb urine dipstick NEGATIVE  NEGATIVE     Bilirubin Urine NEGATIVE  NEGATIVE     Ketones, ur NEGATIVE  NEGATIVE (mg/dL)    Protein, ur 161 (*) NEGATIVE (mg/dL)    Urobilinogen, UA 0.2  0.0 - 1.0 (mg/dL)    Nitrite NEGATIVE  NEGATIVE     Leukocytes, UA NEGATIVE  NEGATIVE    URINE MICROSCOPIC-ADD ON     Status: Abnormal   Collection Time   02/09/11  1:53 PM      Component Value Range Comment   Squamous Epithelial / LPF RARE  RARE     WBC, UA 0-2  <3 (WBC/hpf)    Bacteria, UA RARE  RARE     Casts HYALINE CASTS (*) NEGATIVE    HEMOGLOBIN A1C     Status: Normal   Collection Time   02/10/11  7:58 AM      Component Value Range Comment   Hemoglobin A1C 5.4  <5.7 (%)    Mean Plasma Glucose 108  <117 (mg/dL)   LIPID PANEL     Status: Normal   Collection Time   02/10/11  7:58 AM      Component Value Range Comment   Cholesterol 162  0 - 200 (mg/dL)    Triglycerides 99  <096 (mg/dL)    HDL 57  >04 (mg/dL)    Total  CHOL/HDL Ratio 2.8      VLDL 20  0 - 40 (mg/dL)    LDL Cholesterol 85  0 - 99 (mg/dL)     Dg Chest 2 View  16/11/9602  *RADIOLOGY REPORT*  Clinical Data: Stroke.  Hypertension.  CHF.  COPD.  Pneumonia. Renal insufficiency.  CHEST - 2 VIEW  Comparison: 11/27/2010  Findings: IVC filter error. Lateral view degraded by patient arm position.  Mild S-shaped  thoracolumbar spine curvature.  Patient rotated minimally right.  Moderate cardiomegaly with a tortuous thoracic aorta.  Trace left pleural fluid is new. No pneumothorax.  No congestive failure.  Mild left base scarring.  IMPRESSION:  1.  New trace left pleural fluid. 2.  Cardiomegaly, without congestive failure.  Original Report Authenticated By: Consuello Bossier, M.D.   Mr Brain Wo Contrast  02/11/2011  *RADIOLOGY REPORT*  Clinical Data:  Sudden onset right-sided weakness.  History of hypertension.  History of hyperlipidemia.  Previous subarachnoid hemorrhage 2007, untreated left posterior communicating artery aneurysm.  MRI HEAD WITHOUT CONTRAST MRA HEAD WITHOUT CONTRAST  Technique:  Multiplanar, multiecho pulse sequences of the brain and surrounding structures were obtained without intravenous contrast. Angiographic images of the head were obtained using MRA technique without contrast.  Comparison:  Multiple priors.  Most recent CT 02/09/2011.  Last cerebral angiogram 09/26/2005.  MRI HEAD  Findings:  There is an acute left posterior frontal cortical infarct affecting the precentral gyrus in the expected location to cause right hand weakness.  Subcortical white matter involvement is noted more superiorly.  There is no associated hemorrhage.  There is no mass lesion, hydrocephalus, or extra-axial fluid. There is advanced atrophy with chronic microvascular ischemic change.  There is a remote infarction of the right parietal cortex also involving the subcortical white matter.  The major intracranial vascular structures are patent based on flow void appearance.  Pituitary and cerebellar tonsils are unremarkable.  There is no acute sinus or mastoid disease. Bilateral cataract extraction has been performed.    IMPRESSION: Acute left posterior frontal cortical infarct affecting the precentral gyrus and adjacent subcortical white matter.  Atrophy and small vessel disease.  Remote left parietal infarct.    MRA HEAD   Findings: 3 x 6 mm left posterior communicating artery aneurysm is redemonstrated (image 3 of series 803). There is a proximal component with a wide neck relative to the origin from the parent vessel, and a more narrow component which projects posteriorly along the course of the vessel.  This appearance is essentially unchanged from prior cerebral angiography when technique differences are considered.  There is no proximal stenosis of the internal carotid arteries or basilar artery.  Both vertebrals are patent, with the left dominant.  There is no proximal intracranial stenosis of the anterior, middle, or posterior cerebral arteries.  I am unable to detect a distal branch occlusion in the left MCA territory as compared to the right.  There is no visible cerebellar branch occlusion.  The right AICA likely arises as a combined trunk with the right PICA.    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  No significant intracranial stenosis or visible branch occlusion.  Findings reviewed with Dr. Corliss Skains, who concurs.  Original Report Authenticated By: Elsie Stain, M.D.   Mr Mra Head/brain Wo Cm  02/11/2011  *RADIOLOGY REPORT*  Clinical Data:  Sudden onset right-sided weakness.  History of hypertension.  History of hyperlipidemia.  Previous  subarachnoid hemorrhage 2007, untreated left posterior communicating artery aneurysm.  MRI HEAD WITHOUT CONTRAST MRA HEAD WITHOUT CONTRAST  Technique:  Multiplanar, multiecho pulse sequences of the brain and surrounding structures were obtained without intravenous contrast. Angiographic images of the head were obtained using MRA technique without contrast.  Comparison:  Multiple priors.  Most recent CT 02/09/2011.  Last cerebral angiogram 09/26/2005.  MRI HEAD  Findings:  There is an acute left posterior frontal cortical infarct affecting the precentral gyrus in the expected location to  cause right hand weakness.  Subcortical white matter involvement is noted more superiorly.  There is no associated hemorrhage.  There is no mass lesion, hydrocephalus, or extra-axial fluid. There is advanced atrophy with chronic microvascular ischemic change.  There is a remote infarction of the right parietal cortex also involving the subcortical white matter.  The major intracranial vascular structures are patent based on flow void appearance.  Pituitary and cerebellar tonsils are unremarkable.  There is no acute sinus or mastoid disease. Bilateral cataract extraction has been performed.    IMPRESSION: Acute left posterior frontal cortical infarct affecting the precentral gyrus and adjacent subcortical white matter.  Atrophy and small vessel disease.  Remote left parietal infarct.    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 There is a proximal component with a wide neck relative to the origin from the parent vessel, and a more narrow component which projects posteriorly along the course of the vessel.  This appearance is essentially unchanged from prior cerebral angiography when technique differences are considered.  There is no proximal stenosis of the internal carotid arteries or basilar artery.  Both vertebrals are patent, with the left dominant.  There is no proximal intracranial stenosis of the anterior, middle, or posterior cerebral arteries.  I am unable to detect a distal branch occlusion in the left MCA territory as compared to the right.  There is no visible cerebellar branch occlusion.  The right AICA likely arises as a combined trunk with the right PICA.    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  No significant intracranial stenosis or visible branch occlusion.  Findings reviewed with Dr. Corliss Skains, who concurs.  Original  Report Authenticated By: Elsie Stain, M.D.    Carotid Doppler : Summary: No significant extracranial carotid artery stenosis demonstrated. Vertebrals are patent with antegrade flow.  2Decho Study Conclusions  - Left ventricle: The cavity size was normal. Wall thickness was increased in a pattern of mild LVH. There was mild focal basal hypertrophy of the septum. Systolic function was normal. The estimated ejection fraction was in the range of 55% to 60%. Wall motion was normal; there were no regional wall motion abnormalities. - Aortic valve: Valve mobility was restricted. Mild regurgitation.    Assessment/Plan:   78 YO female with Acute left posterior frontal cortical infarct affecting the precentral gyrus and adjacent subcortical white matter most likely small vessel in origen.  Atrophy and small vessel disease.  Remote left parietal infarct. Patient was not on ASA prior to admission.    Stroke Risk Factors - hyperlipidemia and hypertension  Plan:  1. PT consult, OT consult,  2. Prophylactic therapy-Antiplatelet med: Aspirin - dose 325 mg daily 3. Risk  Factor modification      Felicie Morn PA-C [facility] [phone]  02/11/2011, 12:19 PM

## 2011-02-11 NOTE — Progress Notes (Signed)
[name] CSN:[id number],MRN:[id number] is a 78 y.o. female,  Outpatient Primary MD for the patient is Loreen Freud, DO, DO  Chief Complaint  Patient presents with  . Weakness        Subjective:   [name] today has, No headache, No chest pain, No abdominal pain - No Nausea, No new weakness tingling or numbness, No Cough - SOB.    Objective:   Filed Vitals:   02/10/11 1800 02/10/11 2200 02/11/11 0200 02/11/11 0600  BP: 115/72 129/89 131/67 124/79  Pulse: 69 76 75 77  Temp: 98.3 F (36.8 C) 97.5 F (36.4 C) 98.1 F (36.7 C) 97.6 F (36.4 C)  TempSrc: Oral     Resp: 18 16 16 16   Height:      Weight:      SpO2: 90% 94% 91% 93%    Wt Readings from Last 3 Encounters:  02/09/11 64.411 kg (142 lb)  12/25/10 65.772 kg (145 lb)  11/26/10 64.864 kg (143 lb)     Intake/Output Summary (Last 24 hours) at 02/11/11 0922 Last data filed at 02/10/11 1700  Gross per 24 hour  Intake    660 ml  Output      0 ml  Net    660 ml    Exam Awake Alert, Oriented *3, No new F.N deficits, Normal affect [place].AT,PERRAL Supple Neck,No JVD, No cervical lymphadenopathy appriciated.  Symmetrical Chest wall movement, Good air movement bilaterally, CTAB RRR,No Gallops,Rubs or new Murmurs, No Parasternal Heave +ve B.Sounds, Abd Soft, Non tender, No organomegaly appriciated, No rebound -guarding or rigidity. No Cyanosis, Clubbing or edema, No new Rash or bruise     Data Review  CBC  Lab 02/09/11 1127  WBC 4.9  HGB 12.5  HCT 39.1  PLT 131*  MCV 99.7  MCH 31.9  MCHC 32.0  RDW 14.2  LYMPHSABS 1.2  MONOABS 0.3  EOSABS 0.2  BASOSABS 0.1  BANDABS --    Chemistries   Lab 02/09/11 1127  NA 143  K 4.1  CL 105  CO2 29  GLUCOSE 91  BUN 24*  CREATININE 1.31*  CALCIUM 9.5  MG --  AST 27  ALT 13  ALKPHOS 71  BILITOT 0.4   ------------------------------------------------------------------------------------------------------------------ estimated creatinine clearance is  25.8 ml/min (by C-G formula based on Cr of 1.31). ------------------------------------------------------------------------------------------------------------------  [facility] 02/10/11 0758  HGBA1C 5.4   ------------------------------------------------------------------------------------------------------------------  Basename 02/10/11 0758  CHOL 162  HDL 57  LDLCALC 85  TRIG 99  CHOLHDL 2.8  LDLDIRECT --   ------------------------------------------------------------------------------------------------------------------ No results found for this basename: TSH,T4TOTAL,FREET3,T3FREE,THYROIDAB in the last 72 hours ------------------------------------------------------------------------------------------------------------------ No results found for this basename: VITAMINB12:2,FOLATE:2,FERRITIN:2,TIBC:2,IRON:2,RETICCTPCT:2 in the last 72 hours  Coagulation profile  Lab 02/09/11 1127  INR 0.99  PROTIME --    No results found for this basename: DDIMER:2 in the last 72 hours  Cardiac Enzymes No results found for this basename: CK:3,CKMB:3,TROPONINI:3,MYOGLOBIN:3 in the last 168 hours ------------------------------------------------------------------------------------------------------------------ No components found with this basename: POCBNP:3  Micro Results No results found for this or any previous visit (from the past 240 hour(s)).  Radiology Reports Dg Chest 2 View  02/09/2011  *RADIOLOGY REPORT*  Clinical Data: Stroke.  Hypertension.  CHF.  COPD.  Pneumonia. Renal insufficiency.  CHEST - 2 VIEW  Comparison: 11/27/2010  Findings: IVC filter error. Lateral view degraded by patient arm position.  Mild S-shaped thoracolumbar spine curvature.  Patient rotated minimally right.  Moderate cardiomegaly with a tortuous thoracic aorta.  Trace left pleural fluid is new. No pneumothorax.  No congestive failure.  Mild left base scarring.  IMPRESSION:  1.  New trace left pleural fluid. 2.   Cardiomegaly, without congestive failure.  Original Report Authenticated By: Consuello Bossier, M.D.   Ct Head Wo Contrast  02/09/2011  *RADIOLOGY REPORT*  Clinical Data: Right arm weakness.  Filling better.  CT HEAD WITHOUT CONTRAST  Technique:  Contiguous axial images were obtained from the base of the skull through the vertex without contrast.  Comparison: 09/09/2007.  Findings: Left parietal lobe small to moderate size infarct is new from the prior CT scan although having an appearance suggesting that this is subacute to remote in origin.  Prominent small vessel disease type changes. No CT evidence of large acute infarct.  Small acute infarct cannot be excluded by CT.  No intracranial mass lesion detected on this unenhanced exam.  No intracranial hemorrhage.  Global atrophy without hydrocephalus.  Vascular calcifications.  IMPRESSION: Subacute to remote left parietal lobe infarct.  Prominent small vessel disease type changes.  Original Report Authenticated By: Fuller Canada, M.D.    Scheduled Meds:   . amLODipine  5 mg Oral Daily  . B-complex with vitamin C  1 tablet Oral Daily  . calcium-vitamin D  1 tablet Oral Daily  . calcium-vitamin D  1 tablet Oral Daily  . cyanocobalamin  1,000 mcg Intramuscular Q30 days  . diltiazem  240 mg Oral Daily  . Flora-Q  1 capsule Oral Daily  . furosemide  40 mg Oral BID  . Lactase  1 tablet Oral Daily  . levothyroxine  75 mcg Oral QAC breakfast  . metoprolol succinate  25 mg Oral Daily  . mulitivitamin with minerals  1 tablet Oral Daily  . pantoprazole  40 mg Oral Q1200  . polyvinyl alcohol  1 drop Both Eyes BID  . rosuvastatin  10 mg Oral q1800  . zolpidem  5 mg Oral Once  . DISCONTD: simvastatin  10 mg Oral q1800   Continuous Infusions:  PRN Meds:.senna-docusate  Assessment & Plan   1.L. parietal CVA with R.Arm weakness - H/O Afib - Initiate ASA now, on statin, lipid panel stable, A1c pending, seen by PT-OT, speech, will need SNF, MRI dobe,Echo  pending, neuro called, ? Anticoagulation.  Lipid Panel     Component Value Date/Time   CHOL 162 02/10/2011 0758   TRIG 99 02/10/2011 0758   HDL 57 02/10/2011 0758   CHOLHDL 2.8 02/10/2011 0758   VLDL 20 02/10/2011 0758   LDLCALC 85 02/10/2011 0758    2. H/O Afib- Hypertension - goal RC, on Dilat + lopressor, stable.  3. COPD - no acute issues.  4. H/O SAH - stable CT , follow MRI-A  5. H/O  Hypothyroidism - continue home dose synthroid.  6. H/O Chr Congestive heart failure, unspecified - await Echo, compensated.  7. CKD 3 - at baseline.   DVT Prophylaxis  Lovenox - Heparin - SCDs  See all Orders from today for further details     Leroy Sea M.D on 02/11/2011 at 9:22 AM  [facility] Office  [phone]

## 2011-02-12 LAB — BASIC METABOLIC PANEL
CO2: 25 mEq/L (ref 19–32)
Calcium: 9.9 mg/dL (ref 8.4–10.5)
Chloride: 102 mEq/L (ref 96–112)
Creatinine, Ser: 1.74 mg/dL — ABNORMAL HIGH (ref 0.50–1.10)
GFR calc Af Amer: 29 mL/min — ABNORMAL LOW (ref >90)
Sodium: 144 mEq/L (ref 135–145)

## 2011-02-12 MED ORDER — FUROSEMIDE 40 MG PO TABS
40.0000 mg | ORAL_TABLET | Freq: Two times a day (BID) | ORAL | Status: DC
  Administered 2011-02-12 – 2011-02-13 (×3): 40 mg via ORAL
  Filled 2011-02-12 (×5): qty 1

## 2011-02-12 MED ORDER — ZOLPIDEM TARTRATE 5 MG PO TABS
5.0000 mg | ORAL_TABLET | Freq: Every evening | ORAL | Status: DC | PRN
  Administered 2011-02-12: 5 mg via ORAL
  Filled 2011-02-12: qty 1

## 2011-02-12 MED ORDER — SODIUM CHLORIDE 0.9 % IV SOLN
INTRAVENOUS | Status: DC
  Administered 2011-02-12: 10:00:00 via INTRAVENOUS

## 2011-02-12 NOTE — Progress Notes (Signed)
.  Clinical social worker completed patient psychosocial assessment, please see assessment in patient shadow chart.  Pt is open to short term rehab at a skilled nursing facility, preferring [facility], but open any in [place]. .Clinical social worker initiated skilled nursing facility search, see placement note in patient shadow chart. CSW completed FL2 for MD signature. .Clinical social worker continuing to follow pt to assist with pt dc plans and further csw needs.   Catha Gosselin, LCSWA  [phone] .02/12/2011 9:45am

## 2011-02-12 NOTE — Progress Notes (Signed)
[name] CSN:[id number],MRN:[id number] is a 78 y.o. female,  Outpatient Primary MD for the patient is Loreen Freud, DO, DO  Chief Complaint  Patient presents with  . Weakness        Subjective:   [name] today has, No headache, No chest pain, No abdominal pain - No Nausea, No new weakness tingling or numbness, No Cough - SOB.    Objective:   Filed Vitals:   02/11/11 2208 02/12/11 0231 02/12/11 0638 02/12/11 0940  BP: 136/84 121/69 116/73 117/84  Pulse: 70 65 77 87  Temp: 97.7 F (36.5 C) 97.8 F (36.6 C) 98.3 F (36.8 C) 97.7 F (36.5 C)  TempSrc: Oral Oral Oral   Resp: 18 16 16 16   Height:      Weight:      SpO2: 94% 95% 94% 91%    Wt Readings from Last 3 Encounters:  02/09/11 64.411 kg (142 lb)  12/25/10 65.772 kg (145 lb)  11/26/10 64.864 kg (143 lb)     Intake/Output Summary (Last 24 hours) at 02/12/11 1052 Last data filed at 02/12/11 0747  Gross per 24 hour  Intake    240 ml  Output      0 ml  Net    240 ml    Exam Awake Alert, Oriented *3, No new F.N deficits, Normal affect [place].AT,PERRAL Supple Neck,No JVD, No cervical lymphadenopathy appriciated.  Symmetrical Chest wall movement, Good air movement bilaterally, CTAB RRR,No Gallops,Rubs or new Murmurs, No Parasternal Heave +ve B.Sounds, Abd Soft, Non tender, No organomegaly appriciated, No rebound -guarding or rigidity. No Cyanosis, Clubbing or edema, No new Rash or bruise     Data Review  CBC  Lab 02/09/11 1127  WBC 4.9  HGB 12.5  HCT 39.1  PLT 131*  MCV 99.7  MCH 31.9  MCHC 32.0  RDW 14.2  LYMPHSABS 1.2  MONOABS 0.3  EOSABS 0.2  BASOSABS 0.1  BANDABS --    Chemistries   Lab 02/12/11 0640 02/09/11 1127  NA 144 143  K 3.5 4.1  CL 102 105  CO2 25 29  GLUCOSE 100* 91  BUN 37* 24*  CREATININE 1.74* 1.31*  CALCIUM 9.9 9.5  MG -- --  AST -- 27  ALT -- 13  ALKPHOS -- 71  BILITOT -- 0.4    ------------------------------------------------------------------------------------------------------------------ estimated creatinine clearance is 19.4 ml/min (by C-G formula based on Cr of 1.74). ------------------------------------------------------------------------------------------------------------------  [facility] 02/10/11 0758  HGBA1C 5.4   ------------------------------------------------------------------------------------------------------------------  Basename 02/10/11 0758  CHOL 162  HDL 57  LDLCALC 85  TRIG 99  CHOLHDL 2.8  LDLDIRECT --   ------------------------------------------------------------------------------------------------------------------ No results found for this basename: TSH,T4TOTAL,FREET3,T3FREE,THYROIDAB in the last 72 hours ------------------------------------------------------------------------------------------------------------------ No results found for this basename: VITAMINB12:2,FOLATE:2,FERRITIN:2,TIBC:2,IRON:2,RETICCTPCT:2 in the last 72 hours  Coagulation profile  Lab 02/09/11 1127  INR 0.99  PROTIME --    No results found for this basename: DDIMER:2 in the last 72 hours  Cardiac Enzymes No results found for this basename: CK:3,CKMB:3,TROPONINI:3,MYOGLOBIN:3 in the last 168 hours ------------------------------------------------------------------------------------------------------------------ No components found with this basename: POCBNP:3  Micro Results No results found for this or any previous visit (from the past 240 hour(s)).  Radiology Reports Dg Chest 2 View  02/09/2011  *RADIOLOGY REPORT*  Clinical Data: Stroke.  Hypertension.  CHF.  COPD.  Pneumonia. Renal insufficiency.  CHEST - 2 VIEW  Comparison: 11/27/2010  Findings: IVC filter error. Lateral view degraded by patient arm position.  Mild S-shaped thoracolumbar spine curvature.  Patient rotated minimally right.  Moderate  cardiomegaly with a tortuous thoracic aorta.   Trace left pleural fluid is new. No pneumothorax.  No congestive failure.  Mild left base scarring.  IMPRESSION:  1.  New trace left pleural fluid. 2.  Cardiomegaly, without congestive failure.  Original Report Authenticated By: Consuello Bossier, M.D.   Ct Head Wo Contrast  02/09/2011  *RADIOLOGY REPORT*  Clinical Data: Right arm weakness.  Filling better.  CT HEAD WITHOUT CONTRAST  Technique:  Contiguous axial images were obtained from the base of the skull through the vertex without contrast.  Comparison: 09/09/2007.  Findings: Left parietal lobe small to moderate size infarct is new from the prior CT scan although having an appearance suggesting that this is subacute to remote in origin.  Prominent small vessel disease type changes. No CT evidence of large acute infarct.  Small acute infarct cannot be excluded by CT.  No intracranial mass lesion detected on this unenhanced exam.  No intracranial hemorrhage.  Global atrophy without hydrocephalus.  Vascular calcifications.  IMPRESSION: Subacute to remote left parietal lobe infarct.  Prominent small vessel disease type changes.  Original Report Authenticated By: Fuller Canada, M.D.    Echo - Left ventricle: The cavity size was normal. Wall thickness was increased in a pattern of mild LVH. There was mild focal basal hypertrophy of the septum. Systolic function was normal. The estimated ejection fraction was in the range of 55% to 60%. Wall motion was normal; there were no regional wall motion abnormalities. - Aortic valve: Valve mobility was restricted. Mild regurgitation. - Mitral valve: Mild regurgitation. - Left atrium: The atrium was mildly dilated.  Carotids No significant extracranial carotid artery stenosis demonstrated. Vertebrals are patent with antegrade flow.     Scheduled Meds:    . amLODipine  5 mg Oral Daily  . aspirin EC  325 mg Oral Daily  . B-complex with vitamin C  1 tablet Oral Daily  . calcium-vitamin D  1 tablet Oral  Daily  . calcium-vitamin D  1 tablet Oral Daily  . cyanocobalamin  1,000 mcg Intramuscular Q30 days  . diltiazem  240 mg Oral Daily  . Flora-Q  1 capsule Oral Daily  . furosemide  40 mg Oral BID  . Lactase  1 tablet Oral Daily  . levothyroxine  75 mcg Oral QAC breakfast  . metoprolol succinate  25 mg Oral Daily  . mulitivitamin with minerals  1 tablet Oral Daily  . pantoprazole  40 mg Oral Q1200  . polyvinyl alcohol  1 drop Both Eyes BID  . rosuvastatin  10 mg Oral q1800  . zolpidem  5 mg Oral Once  . DISCONTD: furosemide  40 mg Oral BID   Continuous Infusions:    . DISCONTD: sodium chloride 75 mL/hr at 02/12/11 0945   PRN Meds:.acetaminophen (TYLENOL) oral liquid 160 mg/5 mL, senna-docusate, DISCONTD: acetaminophen  Assessment & Plan   1.L. parietal CVA with R.Arm weakness - H/O Afib - On ASA  , on statin, stable lipid panel & A1c , seen by PT-OT, speech, will need SNF, MRI -A ,Echo noted, neuro following- ASA . SNF placement.  Lab Results  Component Value Date   HGBA1C 5.4 02/10/2011     Lipid Panel     Component Value Date/Time   CHOL 162 02/10/2011 0758   TRIG 99 02/10/2011 0758   HDL 57 02/10/2011 0758   CHOLHDL 2.8 02/10/2011 0758   VLDL 20 02/10/2011 0758   LDLCALC 85 02/10/2011 0758    2. H/O Afib-  Hypertension - goal RC, on Dilat + lopressor, stable.  3. COPD - no acute issues.  4. H/O SAH - stable CT , stable MRI-A  5. H/O  Hypothyroidism - continue home dose synthroid.  6. H/O Chr Congestive heart failure, unspecified - stable Echo, compensated.  7. CKD 3 - at baseline (1.7 to 1.9)   DVT Prophylaxis   SCDs  See all Orders from today for further details     Leroy Sea M.D on 02/12/2011 at 10:52 AM  [facility] Office  [phone]

## 2011-02-12 NOTE — Progress Notes (Signed)
Clinical social worker provided patient and patient family with bed offers. Pt plans to dc to [facility] for short term rehab for anticipated discharge tomorrow, 02/12/2010. Pt sister plans to complete admission paperwork at 2pm today.  .Clinical social worker continuing to follow pt to assist with pt dc plans and further csw needs.   Catha Gosselin, LCSWA  [phone] .02/12/2011 12:17pm

## 2011-02-12 NOTE — Progress Notes (Signed)
Physical Therapy Treatment Patient Details Name: [name] MRN: [id number] DOB: [birth year] Today's Date: 02/12/2011  PT Assessment/Plan  PT - Assessment/Plan Comments on Treatment Session: Pt not approved for CIR therefore pt would benefit from STR-SNF prior to d/c home to maximize strength, balance, independence with functional mobility, & safety.  Pt agreeable.  D/C plans updated.  PT Plan: Discharge plan remains appropriate;Discharge plan needs to be updated PT Frequency: Min 4X/week Follow Up Recommendations: Skilled nursing facility Equipment Recommended: Defer to next venue PT Goals  Acute Rehab PT Goals PT Goal: Sit to Stand - Progress: Progressing toward goal PT Goal: Stand to Sit - Progress: Progressing toward goal PT Goal: Ambulate - Progress: Progressing toward goal  PT Treatment Precautions/Restrictions  Precautions Precautions: Fall Required Braces or Orthoses: No Restrictions Weight Bearing Restrictions: No Mobility (including Balance) Bed Mobility Supine to Sit: 6: Modified independent (Device/Increase time) Sitting - Scoot to Edge of Bed: 6: Modified independent (Device/Increase time);Other (comment) (cues for increased use of RUE) Transfers Sit to Stand: 5: Supervision;From bed;With upper extremity assist Sit to Stand Details (indicate cue type and reason): VC for hand placement & increased use of RUE Stand to Sit: 5: Supervision;To bed;With upper extremity assist Stand to Sit Details: Cues for hand placement & increased use of RUE to control descent.   Ambulation/Gait Ambulation/Gait Assistance: Other (comment) (Min Guard (A)) Ambulation/Gait Assistance Details (indicate cue type and reason): physical contact without AD & no physical contact with use of quad cane.  Slightly unsteady/lateral sway.   Pt still states she feels more comfortable without quad cane due to having to use can in non-dominant hand.  Performed horizontal/vertical head turns,  directional changes, gait speed changes, sudden stops.   Ambulation Distance (Feet): 280 Feet Assistive device: Small based quad cane;None Gait Pattern: Step-through pattern Stairs: No Wheelchair Mobility Wheelchair Mobility: No    Exercise    End of Session PT - End of Session Equipment Utilized During Treatment: Gait belt Activity Tolerance: Patient tolerated treatment well Patient left: Other (comment) (sitting EOB with OT present) General Behavior During Session: [facility] for tasks performed Cognition: [facility] for tasks performed  Lara Mulch 02/12/2011, 3:14 PM [phone]

## 2011-02-12 NOTE — Progress Notes (Signed)
Patient has improved functionally with therapy 02/10/10. She does not need intensive inpatient rehabilitation at this time, recommend SNF rehab vs home with home health and 24/7 supervision of family. Please call me with any questions. Pager [phone].

## 2011-02-12 NOTE — Progress Notes (Signed)
Occupational Therapy Treatment Patient Details Name: [name] MRN: [id number] DOB: 1918-04-15 Today's Date: 02/12/2011  OT Assessment/Plan OT Assessment/Plan OT Plan: Discharge plan needs to be updated Follow Up Recommendations: Skilled nursing facility Equipment Recommended: Defer to next venue OT Goals ADL Goals ADL Goal: Eating - Progress: Progressing toward goals Pt Will Perform Grooming: with supervision;Standing at sink ADL Goal: Grooming - Progress: Revised (modified due to lack of progress/goal met) ADL Goal: Additional Goal #1 - Progress: Progressing toward goals Arm Goals Arm Goal: AROM - Progress: Progressing toward goal Arm Goal: Additional Goal #1 - Progress: Progressing toward goals  OT Treatment Precautions/Restrictions  Precautions Precautions: Fall   ADL ADL ADL Comments: In addition to below listed RUE exercises, patient educated on and performed fine motor exercises focusing on 3-point pinch and control release (picked up cotton balls and placed them in denture container- also performed in reverse order).  Mobility  Bed Mobility Sit to Supine - Left: 7: Independent;HOB flat Transfers Sit to Stand: 5: Supervision;From bed Sit to Stand Details (indicate cue type and reason): VC for hand placement and increased use of RUE Stand to Sit: 5: Supervision;To bed Stand to Sit Details: cues for safe use of RUE Exercises General Exercises - Upper Extremity Elbow Flexion: AROM;10 reps;Right;Seated Elbow Extension: AROM;Right;10 reps;Seated Wrist Flexion: AROM;Right;10 reps;Seated Wrist Extension: AROM;Right;10 reps;Seated Digit Composite Flexion: AROM;Right;10 reps;Seated Composite Extension: AROM;Right;10 reps;Seated Hand Exercises Digit Composite Abduction: 10 reps;AROM;Seated Digit Composite Adduction: AROM;10 reps;Seated Digit Lifts: 10 reps;AROM;Right (initial blocking of other digits by therapist)  End of Session OT - End of Session Equipment  Utilized During Treatment: Gait belt Activity Tolerance: Patient tolerated treatment well (pt reported fatigue at end of session) Patient left: in bed;with call bell in reach;with bed alarm set General Behavior During Session: [facility] for tasks performed Cognition: [facility] for tasks performed  [name]  02/12/2011, 11:35 AM

## 2011-02-12 NOTE — Progress Notes (Signed)
Agree with d/c recommendation.  02/12/2011 Cephus Shelling, PT, DPT [phone]

## 2011-02-12 NOTE — Progress Notes (Signed)
Stroke Team Progress Note  SUBJECTIVE Pt is 78 yr old female who states that at about 8:00pm 02/08/11 she had sudden onset of weakness and loss of use of her rt hand. She thought that maybe her hand had just fallen asleep. She finished watching TV and then went to bed. When she awoke this am, her hand still didn't work and she broke two coffee cups trying to use it. Furthermore, the patient states that she did not sleep well. She states that she just didn't feel right. She denies confusion, difficulty with speech, swallowing, or dizziness. She denies any new LE weakness or change in gait. Pt uses a cane to get around since her subarachnoid hemorrhage. She also denies chest pain, SOB or cough.  Her friend and sister are at the bedside. Overall she feels her condition is stable. Patient states she has a history of an aneurysm, but no hemorrhage. Dr. Dutch Quint followed. She does have history of AFib. She has not been on coumadin.  OBJECTIVE Most recent Vital Signs: Temp: 97.7 F (36.5 C) (01/03 0940) Temp src: Oral (01/03 0638) BP: 117/84 mmHg (01/03 0940) Pulse Rate: 87  (01/03 0940) Respiratory Rate: 16 O2 Saturation: 91%  CBG (last 3)  No results found for this basename: GLUCAP:3 in the last 72 hours Intake/Output from previous day: 01/02 0701 - 01/03 0700 In: 360 [P.O.:360] Out: -   IV Fluid Intake:     . DISCONTD: sodium chloride 75 mL/hr at 02/12/11 0945   Medications    . amLODipine  5 mg Oral Daily  . aspirin EC  325 mg Oral Daily  . B-complex with vitamin C  1 tablet Oral Daily  . calcium-vitamin D  1 tablet Oral Daily  . calcium-vitamin D  1 tablet Oral Daily  . cyanocobalamin  1,000 mcg Intramuscular Q30 days  . diltiazem  240 mg Oral Daily  . Flora-Q  1 capsule Oral Daily  . furosemide  40 mg Oral BID  . Lactase  1 tablet Oral Daily  . levothyroxine  75 mcg Oral QAC breakfast  . metoprolol succinate  25 mg Oral Daily  . mulitivitamin with minerals  1 tablet Oral Daily    . pantoprazole  40 mg Oral Q1200  . polyvinyl alcohol  1 drop Both Eyes BID  . rosuvastatin  10 mg Oral q1800  . zolpidem  5 mg Oral Once  . DISCONTD: furosemide  40 mg Oral BID  PRN acetaminophen (TYLENOL) oral liquid 160 mg/5 mL, senna-docusate, DISCONTD: acetaminophen  Diet:  Cardiac thin liquids Activity:  Up with assistance DVT Prophylaxis:  SCDs ordered   Studies: CBC    Component Value Date/Time   WBC 4.9 02/09/2011 1127   WBC 6.1 11/26/2008 1338   RBC 3.92 02/09/2011 1127   HGB 12.5 02/09/2011 1127   HGB 13.0 11/26/2008 1338   HCT 39.1 02/09/2011 1127   HCT 38.3 11/26/2008 1338   PLT 131* 02/09/2011 1127   PLT 148 11/26/2008 1338   MCV 99.7 02/09/2011 1127   MCV 95 11/26/2008 1338   MCH 31.9 02/09/2011 1127   MCHC 32.0 02/09/2011 1127   RDW 14.2 02/09/2011 1127   LYMPHSABS 1.2 02/09/2011 1127   MONOABS 0.3 02/09/2011 1127   EOSABS 0.2 02/09/2011 1127   EOSABS 0.2 11/26/2008 1338   BASOSABS 0.1 02/09/2011 1127   BASOSABS 0.0 11/26/2008 1338   CMP    Component Value Date/Time   NA 144 02/12/2011 0640   K 3.5 02/12/2011 0640  CL 102 02/12/2011 0640   CO2 25 02/12/2011 0640   GLUCOSE 100* 02/12/2011 0640   GLUCOSE 93 02/19/2006 1027   BUN 37* 02/12/2011 0640   CREATININE 1.74* 02/12/2011 0640   CALCIUM 9.9 02/12/2011 0640   PROT 7.7 02/09/2011 1127   ALBUMIN 3.8 02/09/2011 1127   AST 27 02/09/2011 1127   ALT 13 02/09/2011 1127   ALKPHOS 71 02/09/2011 1127   BILITOT 0.4 02/09/2011 1127   GFRNONAA 25* 02/12/2011 0640   GFRAA 29* 02/12/2011 0640   COAGS Lab Results  Component Value Date   INR 0.99 02/09/2011   INR 0.9 03/19/2010   INR 1.0 07/31/2007   Lipid Panel    Component Value Date/Time   CHOL 162 02/10/2011 0758   TRIG 99 02/10/2011 0758   HDL 57 02/10/2011 0758   CHOLHDL 2.8 02/10/2011 0758   VLDL 20 02/10/2011 0758   LDLCALC 85 02/10/2011 0758   HgbA1C  Lab Results  Component Value Date   HGBA1C 5.4 02/10/2011   Urine Drug Screen  No results found for this  basename: labopia, cocainscrnur, labbenz, amphetmu, thcu, labbarb    Alcohol Level No results found for this basename: eth   CT of the brain  Subacute to remote left parietal lobe infarct. Prominent small vessel disease type changes.  MRI of the brain  Acute left posterior frontal cortical infarct affecting the precentral gyrus and adjacent subcortical white matter.  Atrophy and small vessel disease.  Remote left parietal infarct.   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  No significant intracranial stenosis or visible branch occlusion.  Findings reviewed with Dr. Corliss Skains, who concurs.    2D Echocardiogram  EF 55-60% with no source of embolus.   Carotid Doppler  No internal carotid artery stenosis bilaterally. Vertebrals with antegrade flow bilaterally.   CXR  1. New trace left pleural fluid. 2. Cardiomegaly, without congestive failure.  EKG  atrial fibrillation, rate 109, borderline prolonged QT interval.   Physical Exam   Awake  Alert oriented x 3. Normal speech and language.eye movements full without nystagmus. Face symmetric. Tongue midline. Normal strength, tone, reflexes and coordination.diminished fine finger movements on the right. Orbits the left over right upper extremity. Normal sensation. Gait deferred.  ASSESSMENT Ms. [name] is a 78 y.o. female with an acute left posterior frontal infarct, secondary to atrial fibrillation . On aspirin 325 mg orally every day for secondary stroke prevention.  Aneurysm without known history of rupture per patient  Stroke risk factors:  atrial fibrillation, hyperlipidemia and hypertension  Hospital day # 3  TREATMENT/PLAN Continue aspirin 325 mg orally every day for secondary stroke prevention. Recommend coumadin for atrial fibrillation if no history of hemorrhage is confirmed. Need to determine with Dr. Dutch Quint. Dr. Pearlean Brownie  discussed with Dr. Thedore Mins.  Joaquin Music, ANP-BC, GNP-BC [facility] Pager: [phone] 02/12/2011 11:27 AM  Dr. Delia Heady, Stroke Center Medical Director, has personally reviewed chart, pertinent data, examined the patient and developed the plan of care.

## 2011-02-13 MED ORDER — ASPIRIN 325 MG PO TBEC: 325.0000 mg | DELAYED_RELEASE_TABLET | Freq: Every day | ORAL | Status: AC

## 2011-02-13 NOTE — Progress Notes (Signed)
Stroke Team Progress Note  SUBJECTIVE Pt is 78 yr old female who states that at about 8:00pm 02/08/11 she had sudden onset of weakness and loss of use of her rt hand. She thought that maybe her hand had just fallen asleep. She finished watching TV and then went to bed. When she awoke this am, her hand still didn't work and she broke two coffee cups trying to use it. Furthermore, the patient states that she did not sleep well. She states that she just didn't feel right. She denies confusion, difficulty with speech, swallowing, or dizziness. She denies any new LE weakness or change in gait. Pt uses a cane to get around since her subarachnoid hemorrhage. She also denies chest pain, SOB or cough.  OBJECTIVE Most recent Vital Signs: Temp: 97.7 F (36.5 C) (01/04 1339) Temp src: Oral (01/04 1339) BP: 141/79 mmHg (01/04 1339) Pulse Rate: 94  (01/04 1339) Respiratory Rate: 18 O2 Saturation: 94%  CBG (last 3)  No results found for this basename: GLUCAP:3 in the last 72 hours Intake/Output from previous day: 01/03 0701 - 01/04 0700 In: 560 [P.O.:560] Out: -   IV Fluid Intake:    Medications    . amLODipine  5 mg Oral Daily  . aspirin EC  325 mg Oral Daily  . B-complex with vitamin C  1 tablet Oral Daily  . calcium-vitamin D  1 tablet Oral Daily  . calcium-vitamin D  1 tablet Oral Daily  . cyanocobalamin  1,000 mcg Intramuscular Q30 days  . diltiazem  240 mg Oral Daily  . Flora-Q  1 capsule Oral Daily  . furosemide  40 mg Oral BID  . Lactase  1 tablet Oral Daily  . levothyroxine  75 mcg Oral QAC breakfast  . metoprolol succinate  25 mg Oral Daily  . mulitivitamin with minerals  1 tablet Oral Daily  . pantoprazole  40 mg Oral Q1200  . polyvinyl alcohol  1 drop Both Eyes BID  . rosuvastatin  10 mg Oral q1800  PRN acetaminophen (TYLENOL) oral liquid 160 mg/5 mL, senna-docusate, zolpidem  Diet:  Cardiac thin liquids Activity:  Up with assistance DVT Prophylaxis:  SCDs ordered    Studies: CBC     Component Value Date/Time   WBC 4.9 02/09/2011 1127   WBC 6.1 11/26/2008 1338   RBC 3.92 02/09/2011 1127   HGB 12.5 02/09/2011 1127   HGB 13.0 11/26/2008 1338   HCT 39.1 02/09/2011 1127   HCT 38.3 11/26/2008 1338   PLT 131* 02/09/2011 1127   PLT 148 11/26/2008 1338   MCV 99.7 02/09/2011 1127   MCV 95 11/26/2008 1338   MCH 31.9 02/09/2011 1127   MCHC 32.0 02/09/2011 1127   RDW 14.2 02/09/2011 1127   LYMPHSABS 1.2 02/09/2011 1127   MONOABS 0.3 02/09/2011 1127   EOSABS 0.2 02/09/2011 1127   EOSABS 0.2 11/26/2008 1338   BASOSABS 0.1 02/09/2011 1127   BASOSABS 0.0 11/26/2008 1338   CMP    Component Value Date/Time   NA 144 02/12/2011 0640   K 3.5 02/12/2011 0640   CL 102 02/12/2011 0640   CO2 25 02/12/2011 0640   GLUCOSE 100* 02/12/2011 0640   GLUCOSE 93 02/19/2006 1027   BUN 37* 02/12/2011 0640   CREATININE 1.74* 02/12/2011 0640   CALCIUM 9.9 02/12/2011 0640   PROT 7.7 02/09/2011 1127   ALBUMIN 3.8 02/09/2011 1127   AST 27 02/09/2011 1127   ALT 13 02/09/2011 1127   ALKPHOS 71 02/09/2011 1127  BILITOT 0.4 02/09/2011 1127   GFRNONAA 25* 02/12/2011 0640   GFRAA 29* 02/12/2011 0640   COAGS Lab Results  Component Value Date   INR 0.99 02/09/2011   INR 0.9 03/19/2010   INR 1.0 07/31/2007   Lipid Panel    Component Value Date/Time   CHOL 162 02/10/2011 0758   TRIG 99 02/10/2011 0758   HDL 57 02/10/2011 0758   CHOLHDL 2.8 02/10/2011 0758   VLDL 20 02/10/2011 0758   LDLCALC 85 02/10/2011 0758   HgbA1C  Lab Results  Component Value Date   HGBA1C 5.4 02/10/2011   Urine Drug Screen  No results found for this basename: labopia,  cocainscrnur,  labbenz,  amphetmu,  thcu,  labbarb    Alcohol Level No results found for this basename: eth   CT of the brain  Subacute to remote left parietal lobe infarct. Prominent small vessel disease type changes.  MRI of the brain  Acute left posterior frontal cortical infarct affecting the precentral gyrus and adjacent subcortical white  matter.  Atrophy and small vessel disease.  Remote left parietal infarct.   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  No significant intracranial stenosis or visible branch occlusion.  Findings reviewed with Dr. Corliss Skains, who concurs.    2D Echocardiogram  EF 55-60% with no source of embolus.   Carotid Doppler  No internal carotid artery stenosis bilaterally. Vertebrals with antegrade flow bilaterally.   CXR  1. New trace left pleural fluid. 2. Cardiomegaly, without congestive failure.  EKG  atrial fibrillation, rate 109, borderline prolonged QT interval.   Physical Exam   Awake  Alert oriented x 3. Normal speech and language.eye movements full without nystagmus. Face symmetric. Tongue midline. Normal strength, tone, reflexes and coordination.diminished fine finger movements on the right. Orbits the left over right upper extremity. Normal sensation. Gait deferred.   ASSESSMENT [name] is a 78 y.o. female with an acute left posterior frontal infarct, secondary to atrial fibrillation . On aspirin 325 mg orally every day for secondary stroke prevention.  Aneurysm. Per Dr. Thedore Mins, 2007 echart records report aneuyrsmal subarachnoid hemorrhage. He discussed with Dr. Dutch Quint. Patient is not a coumadin candidate secondary to history of hemorrhage. Will not add plavix to ASA in setting of afib for the same reason.  Stroke risk factors:  atrial fibrillation, hyperlipidemia and hypertension  Hospital day # 4  TREATMENT/PLAN Continue aspirin 325 mg orally every day for secondary stroke prevention. Ongoing risk factor control. Will sign off. Follow up with Dr. Pearlean Brownie in 2 months.   Joaquin Music, ANP-BC, GNP-BC [facility] Pager: [phone] 02/13/2011 1:47 PM  Dr. Delia Heady, Stroke Center Medical Director, has personally reviewed chart, pertinent data, examined the  patient and developed the plan of care.

## 2011-02-13 NOTE — Telephone Encounter (Signed)
ED for a stroke and f/u apt 02/23/11      KP

## 2011-02-13 NOTE — Progress Notes (Signed)
.  Clinical social worker assisted with patient discharge to skilled nursing facility, [facility]. Pt refused transportation, and requested to be transported by pt family. .No further Clinical Social Work needs, signing off. Marland Kitchen   Catha Gosselin, Theresia Majors  [phone] .02/13/2011 16:10pm

## 2011-02-13 NOTE — Progress Notes (Signed)
Pt. D/c instructions provided. Pt verbalized understanding. Pt I/V d/c intact. Pt under no s/s distress.

## 2011-02-13 NOTE — Discharge Summary (Signed)
[name], 78 y.o., DOB 25-Oct-1918, MRN [id number]. Admission date: 02/09/2011 Discharge Date 02/13/2011 Primary MD Loreen Freud, DO, DO Admitting Physician Ava Swayze, DO  Admission Diagnosis  Stroke [434.91] Stroke  Discharge Diagnosis   Principal Problem:  *CVA (cerebral infarction) Active Problems:  HYPERLIPIDEMIA  HYPERTENSION  Subarachnoid hemorrhage  COPD  GERD  BACK PAIN  Hypothyroidism  Subarachnoid hemorrhage  Pulmonary embolism  Campath-induced atrial fibrillation  Congestive heart failure, unspecified  Chronic diarrhea  Hemiplegia affecting dominant side  CKD (chronic kidney disease) stage 3, GFR 30-59 ml/min      Past Medical History  Diagnosis Date  . Osteopenia   . History of cervical cancer   . Benign neoplasm of adrenal gland   . Hypothyroidism   . COPD (chronic obstructive pulmonary disease)   . Esophageal reflux   . Osteoarthrosis, unspecified whether generalized or localized, unspecified site   . Unspecified pleural effusion   . Subarachnoid hemorrhage     No coumadin or ASA-Dr.[facility].Marland Kitchenaneurysm..no treatment  . Hypertension   . Hyperlipidemia   . Personal history of venous thrombosis and embolism     No coumadin because of Subarachnoid hemorrhage  . Unspecified disorder resulting from impaired renal function   . Congestive heart failure, unspecified     EF 55-60%, echo, March, 2011  . GERD (gastroesophageal reflux disease)   . Aortic insufficiency     Mild, echo, March, 2011  . Mitral regurgitation     Mild, echo, March, 2011  . Aortic root dilatation     4.5 cm echo, November, 2010 / no mention  . Dizziness   . Anemia   . Chronic kidney disease   . Pulmonary embolism     Also DVT but no Coumadin because of subarachnoid hemorrhage  . Campath-induced atrial fibrillation     cannot take Coumadin /  Holter May, 2011.. mild bradycardia digoxin stopped / September, 2011, shortness of breath and palpitations.. digoxin resume /   March, 2012 can't find it digoxin on med list... to be restarted  . Shingles     November, 2011  . Ejection fraction     55-60%, echo, March, 2011  . Shortness of breath     Nuclear 07/11/2010,,73%, no ischemia  . Renal insufficiency   . Macular degeneration (senile) of retina, unspecified   . Myalgia and myositis, unspecified   . B12 deficiency anemia   . Pulmonary embolism     Past Surgical History  Procedure Date  . Foot surgery   . Cataract extraction   . Cholecystectomy   . Pulmonary embolism surgery     vena cava filter  . Orif hip fracture     07/2007  . Epidural block injection     11/2009  . Abdominal hysterectomy   . Appendectomy      Hospital Course See H&P, Labs, Consult and Test reports for all details in brief, patient was admitted for    1.L. parietal CVA with R.Arm weakness - H/O Afib - On ASA , on statin, stable lipid panel & A1c , seen by PT-OT, speech, will need continued PT-OT at SNF, MRI -A ,Echo noted, seen by Neuro, no further changes, outpt neuro follow up in 2 mths. D/W Dr Pearlean Brownie ASA and Statin only.   Lab Results   Component  Value  Date    HGBA1C  5.4  02/10/2011    Lipid Panel    Component  Value  Date/Time    CHOL  162  02/10/2011  0758    TRIG  99  02/10/2011 0758    HDL  57  02/10/2011 0758    CHOLHDL  2.8  02/10/2011 0758    VLDL  20  02/10/2011 0758    LDLCALC  85  02/10/2011 0758     2. H/O Afib- Hypertension - goal RC, on Dilat + lopressor, stable.   3. COPD - no acute issues.   4. H/O SAH - stable CT , stable MRI-A , D/W N Surg Dr Roselyn Bering- +ve bleed in the past.  5. H/O Hypothyroidism - continue home dose synthroid.   6. H/O Chr Congestive heart failure, unspecified - stable Echo, compensated.   7. CKD 3 - at baseline (1.7 to 1.9), please monitor BMP in 3 days then in 7 days, monitor creatinine,  K , weight and Lasix dose closely.   Consults  Neuro  Significant Tests:  See full reports for all details     Echo  - Left ventricle: The  cavity size was normal. Wall thickness was increased in a pattern of mild LVH. There was mild focal basal hypertrophy of the septum. Systolic function was normal. The estimated ejection fraction was in the range of 55% to 60%. Wall motion was normal; there were no regional wall motion abnormalities. - Aortic valve: Valve mobility was restricted. Mild regurgitation. - Mitral valve: Mild regurgitation. - Left atrium: The atrium was mildly dilated.   Carotids  No significant extracranial carotid artery stenosis demonstrated. Vertebrals are patent with antegrade flow.     Dg Chest 2 View  02/09/2011  *RADIOLOGY REPORT*  Clinical Data: Stroke.  Hypertension.  CHF.  COPD.  Pneumonia. Renal insufficiency.  CHEST - 2 VIEW  Comparison: 11/27/2010  Findings: IVC filter error. Lateral view degraded by patient arm position.  Mild S-shaped thoracolumbar spine curvature.  Patient rotated minimally right.  Moderate cardiomegaly with a tortuous thoracic aorta.  Trace left pleural fluid is new. No pneumothorax.  No congestive failure.  Mild left base scarring.  IMPRESSION:  1.  New trace left pleural fluid. 2.  Cardiomegaly, without congestive failure.  Original Report Authenticated By: Consuello Bossier, M.D.   Ct Head Wo Contrast  02/09/2011  *RADIOLOGY REPORT*  Clinical Data: Right arm weakness.  Filling better.  CT HEAD WITHOUT CONTRAST  Technique:  Contiguous axial images were obtained from the base of the skull through the vertex without contrast.  Comparison: 09/09/2007.  Findings: Left parietal lobe small to moderate size infarct is new from the prior CT scan although having an appearance suggesting that this is subacute to remote in origin.  Prominent small vessel disease type changes. No CT evidence of large acute infarct.  Small acute infarct cannot be excluded by CT.  No intracranial mass lesion detected on this unenhanced exam.  No intracranial hemorrhage.  Global atrophy without hydrocephalus.  Vascular  calcifications.  IMPRESSION: Subacute to remote left parietal lobe infarct.  Prominent small vessel disease type changes.  Original Report Authenticated By: Fuller Canada, M.D.   Mr Brain Wo Contrast  02/11/2011  *RADIOLOGY REPORT*  Clinical Data:  Sudden onset right-sided weakness.  History of hypertension.  History of hyperlipidemia.  Previous subarachnoid hemorrhage 2007, untreated left posterior communicating artery aneurysm.  MRI HEAD WITHOUT CONTRAST MRA HEAD WITHOUT CONTRAST  Technique:  Multiplanar, multiecho pulse sequences of the brain and surrounding structures were obtained without intravenous contrast. Angiographic images of the head were obtained using MRA technique without contrast.  Comparison:  Multiple priors.  Most recent CT 02/09/2011.  Last cerebral angiogram 09/26/2005.  MRI HEAD  Findings:  There is an acute left posterior frontal cortical infarct affecting the precentral gyrus in the expected location to cause right hand weakness.  Subcortical white matter involvement is noted more superiorly.  There is no associated hemorrhage.  There is no mass lesion, hydrocephalus, or extra-axial fluid. There is advanced atrophy with chronic microvascular ischemic change.  There is a remote infarction of the right parietal cortex also involving the subcortical white matter.  The major intracranial vascular structures are patent based on flow void appearance.  Pituitary and cerebellar tonsils are unremarkable.  There is no acute sinus or mastoid disease. Bilateral cataract extraction has been performed.  IMPRESSION: Acute left posterior frontal cortical infarct affecting the precentral gyrus and adjacent subcortical white matter.  Atrophy and small vessel disease.  Remote left parietal infarct.  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 There is a proximal component with a wide neck relative to the origin from the parent vessel,  and a more narrow component which projects posteriorly along the course of the vessel.  This appearance is essentially unchanged from prior cerebral angiography when technique differences are considered.  There is no proximal stenosis of the internal carotid arteries or basilar artery.  Both vertebrals are patent, with the left dominant.  There is no proximal intracranial stenosis of the anterior, middle, or posterior cerebral arteries.  I am unable to detect a distal branch occlusion in the left MCA territory as compared to the right.  There is no visible cerebellar branch occlusion.  The right AICA likely arises as a combined trunk with the right PICA.  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  No significant intracranial stenosis or visible branch occlusion.  Findings reviewed with Dr. Corliss Skains, who concurs.  Original Report Authenticated By: Elsie Stain, M.D.   Mr Mra Head/brain Wo Cm  02/11/2011  *RADIOLOGY REPORT*  Clinical Data:  Sudden onset right-sided weakness.  History of hypertension.  History of hyperlipidemia.  Previous subarachnoid hemorrhage 2007, untreated left posterior communicating artery aneurysm.  MRI HEAD WITHOUT CONTRAST MRA HEAD WITHOUT CONTRAST  Technique:  Multiplanar, multiecho pulse sequences of the brain and surrounding structures were obtained without intravenous contrast. Angiographic images of the head were obtained using MRA technique without contrast.  Comparison:  Multiple priors.  Most recent CT 02/09/2011.  Last cerebral angiogram 09/26/2005.  MRI HEAD  Findings:  There is an acute left posterior frontal cortical infarct affecting the precentral gyrus in the expected location to cause right hand weakness.  Subcortical white matter involvement is noted more superiorly.  There is no associated hemorrhage.  There is no mass lesion, hydrocephalus, or extra-axial fluid. There is  advanced atrophy with chronic microvascular ischemic change.  There is a remote infarction of the right parietal cortex also involving the subcortical white matter.  The major intracranial vascular structures are patent based on flow void appearance.  Pituitary and cerebellar tonsils are unremarkable.  There is no acute sinus or mastoid disease. Bilateral cataract extraction has been performed.  IMPRESSION: Acute left posterior frontal cortical infarct affecting the precentral gyrus and adjacent subcortical white matter.  Atrophy and small vessel disease.  Remote left parietal infarct.  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 There is a proximal component with a wide neck relative to the origin from the parent vessel, and a more narrow component which projects posteriorly along the course of the vessel.  This appearance is essentially unchanged from prior cerebral angiography when technique differences are considered.  There is no proximal stenosis of the internal carotid arteries or basilar artery.  Both vertebrals are patent, with the left dominant.  There is no proximal intracranial stenosis of the anterior, middle, or posterior cerebral arteries.  I am unable to detect a distal branch occlusion in the left MCA territory as compared to the right.  There is no visible cerebellar branch occlusion.  The right AICA likely arises as a combined trunk with the right PICA.  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  No significant intracranial stenosis or visible branch occlusion.  Findings reviewed with Dr. Corliss Skains, who concurs.  Original Report Authenticated By: Elsie Stain, M.D.     Today   Subjective:   [name] today has no headache,no chest abdominal pain,no new weakness tingling or numbness, feels much better wants to go home  today.    Objective:   Blood pressure 141/79, pulse 94, temperature 97.7 F (36.5 C), temperature source Oral, resp. rate 18, height 5\' 3"  (1.6 m), weight 64.411 kg (142 lb), SpO2 94.00%.  Intake/Output Summary (Last 24 hours) at 02/13/11 1409 Last data filed at 02/13/11 1340  Gross per 24 hour  Intake    600 ml  Output      0 ml  Net    600 ml    Exam Awake Alert, Oriented *3, No new F.N deficits, Rt arm strength improved, 4/5, Normal affect Matthews.AT,PERRAL Supple Neck,No JVD, No cervical lymphadenopathy appriciated.  Symmetrical Chest wall movement, Good air movement bilaterally, CTAB RRR,No Gallops,Rubs or new Murmurs, No Parasternal Heave +ve B.Sounds, Abd Soft, Non tender, No organomegaly appriciated, No rebound -guarding or rigidity. No Cyanosis, Clubbing or edema, No new Rash or bruise  Data Review      CBC w Diff: Lab Results  Component Value Date   WBC 4.9 02/09/2011   WBC 6.1 11/26/2008   HGB 12.5 02/09/2011   HGB 13.0 11/26/2008   HCT 39.1 02/09/2011   HCT 38.3 11/26/2008   PLT 131* 02/09/2011   PLT 148 11/26/2008   LYMPHOPCT 25 02/09/2011   LYMPHOPCT 41.9 11/26/2008   MONOPCT 7 02/09/2011   MONOPCT 5.9 11/26/2008   EOSPCT 3 02/09/2011   EOSPCT 2.5 11/26/2008   BASOPCT 1 02/09/2011   BASOPCT 0.7 11/26/2008   CMP: Lab Results  Component Value Date   NA 144 02/12/2011   K 3.5 02/12/2011   CL 102 02/12/2011   CO2 25 02/12/2011   BUN 37* 02/12/2011   CREATININE 1.74* 02/12/2011   PROT 7.7 02/09/2011   ALBUMIN 3.8 02/09/2011   BILITOT 0.4 02/09/2011   ALKPHOS 71 02/09/2011   AST 27 02/09/2011   ALT 13 02/09/2011  .  Micro Results No results found for this or any previous visit (from the past 240 hour(s)).   Discharge Instructions     Follow with Primary MD Loreen Freud, DO, DO in 7 days   Get CBC, CMP, checked 7 days by Primary MD and again as instructed by your Primary MD.   Get Medicines reviewed and adjusted.  Please request your Prim.MD to go over  all Hospital Tests and Procedure/Radiological results at the  follow up, please get all Hospital records sent to your Prim MD by signing hospital release before you go home.  Follow-up Information    Follow up with Gates Rigg, MD. Make an appointment in 2 months.   Contact information:   [address] [facility] [place] [address] [phone]       Follow up with Loreen Freud, DO.   Contact information:   [address] [place] [address] [phone]           Activity: Fall precautions use walker/cane & assistance as needed  Diet: Cardiac, Aspiration precautions.  For Heart failure patients - Check your Weight same time everyday, if you gain over 2 pounds, or you develop in leg swelling, experience more shortness of breath or chest pain, call your Primary MD immediately. Follow Cardiac Low Salt Diet and 1.8 lit/day fluid restriction.  Disposition Home  If you experience worsening of your admission symptoms, develop shortness of breath, life threatening emergency, suicidal or homicidal thoughts you must seek medical attention immediately by calling 911 or calling your MD immediately  if symptoms less severe.  You Must read complete instructions/literature along with all the possible adverse reactions/side effects for all the Medicines you take and that have been prescribed to you. Take any new Medicines after you have completely understood and accpet all the possible adverse reactions/side effects.     Follow-up Information    Follow up with Gates Rigg, MD. Make an appointment in 2 months.   Contact information:   [address], [address] [facility] [place] [address] [phone]       Follow up with Loreen Freud, DO.   Contact information:   [address] [place]  [address] [phone]          Discharge Medications   Medication List  As of 02/13/2011  2:09 PM   START taking these medications         aspirin 325 MG EC tablet   Take 1 tablet (325 mg total) by mouth daily.         CONTINUE taking these medications         ALIGN 4 MG Caps      amLODipine 5 MG tablet   Commonly known as: NORVASC   TAKE 1 TABLET BY MOUTH DAILY      B-complex with vitamin C tablet      Calcium Carbonate-Vit D-Min 600-400 MG-UNIT Tabs      cyanocobalamin 1000 MCG/ML injection   Commonly known as: (VITAMIN B-12)      diltiazem 240 MG 24 hr capsule   Commonly known as: CARDIZEM CD   Take 1 capsule (240 mg total) by mouth daily.      furosemide 40 MG tablet   Commonly known as: LASIX   Take 1 tablet (40 mg total) by mouth 2 (two) times daily.      lactase 3000 UNITS tablet   Commonly known as: LACTAID      metoprolol succinate 25 MG 24 hr tablet   Commonly known as: TOPROL-XL   TAKE ONE TABLET BY MOUTH DAILY      multivitamin tablet      Osteo Bi-Flex Adv Triple St Tabs      pravastatin 20 MG tablet   Commonly known as: PRAVACHOL   TAKE 1 TABLET BY MOUTH AT BEDTIME      SYNTHROID  75 MCG tablet   Generic drug: levothyroxine   TAKE 1 TABLET BY MOUTH EVERY DAY      SYSTANE BALANCE OP          Where to get your medications    These are the prescriptions that you need to pick up. We sent them to a specific pharmacy, so you will need to go there to get them.   [id number] [place], [place] - [address]    [address] [place] [address]    Phone: [phone]        aspirin 325 MG EC tablet             Total Time in preparing paper work, data evaluation and todays exam - 35 minutes  Leroy Sea M.D on 02/13/2011 at 2:09 PM  [facility] Office  [phone]

## 2011-02-23 ENCOUNTER — Encounter: Payer: Medicare Other | Admitting: Family Medicine

## 2011-03-09 ENCOUNTER — Encounter: Payer: Self-pay | Admitting: Family Medicine

## 2011-03-09 ENCOUNTER — Ambulatory Visit (INDEPENDENT_AMBULATORY_CARE_PROVIDER_SITE_OTHER): Payer: Medicare Other | Admitting: Family Medicine

## 2011-03-09 ENCOUNTER — Other Ambulatory Visit: Payer: Self-pay | Admitting: *Deleted

## 2011-03-09 DIAGNOSIS — I639 Cerebral infarction, unspecified: Secondary | ICD-10-CM

## 2011-03-09 DIAGNOSIS — E039 Hypothyroidism, unspecified: Secondary | ICD-10-CM

## 2011-03-09 DIAGNOSIS — R10819 Abdominal tenderness, unspecified site: Secondary | ICD-10-CM

## 2011-03-09 DIAGNOSIS — J069 Acute upper respiratory infection, unspecified: Secondary | ICD-10-CM

## 2011-03-09 DIAGNOSIS — I6350 Cerebral infarction due to unspecified occlusion or stenosis of unspecified cerebral artery: Secondary | ICD-10-CM

## 2011-03-09 MED ORDER — DILTIAZEM HCL ER COATED BEADS 240 MG PO CP24: 240.0000 mg | ORAL_CAPSULE | Freq: Every day | ORAL | Status: DC

## 2011-03-09 NOTE — Progress Notes (Signed)
  Subjective:    Patient ID: [name], female    DOB: Oct 20, 1918, 78 y.o.   MRN: [id number]  HPI  Pt here f/u s/p stroke affecting  R side.  She is doing much better and she has OT and PT coming to the house.  She had bronchitis But is feeling better.  Pt is just feeling very tired.   Review of Systems As above    Objective:   Physical Exam  Constitutional: She is oriented to person, place, and time. She appears well-developed and well-nourished.  Neurological: She is alert and oriented to person, place, and time. She displays normal reflexes. No cranial nerve deficit. She exhibits normal muscle tone. Coordination normal.       Good strength in all 4 ext Walking with cane  Psychiatric: She has a normal mood and affect. Her behavior is normal. Judgment and thought content normal.          Assessment & Plan:  S/p stroke--- con't PT/ OT,  Pt doing well  2.  Hypothyroid---check labs 3 hyperlipidemia ---see hospital labs, con't meds 4HTN-- stable , con't meds

## 2011-03-09 NOTE — Patient Instructions (Addendum)
Upper Respiratory Infection, Adult An upper respiratory infection (URI) is also known as the common cold. It is often caused by a type of germ (virus). Colds are easily spread (contagious). You can pass it to others by kissing, coughing, sneezing, or drinking out of the same glass. Usually, you get better in 1 or 2 weeks.  HOME CARE   Only take medicine as told by your doctor.   Use a warm mist humidifier or breathe in steam from a hot shower.   Drink enough water and fluids to keep your pee (urine) clear or pale yellow.   Get plenty of rest.   Return to work when your temperature is back to normal or as told by your doctor. You may use a face mask and wash your hands to stop your cold from spreading.  GET HELP RIGHT AWAY IF:   After the first few days, you feel you are getting worse.   You have questions about your medicine.   You have chills, shortness of breath, or brown or red spit (mucus).   You have yellow or brown snot (nasal discharge) or pain in the face, especially when you bend forward.   You have a fever, puffy (swollen) neck, pain when you swallow, or white spots in the back of your throat.   You have a bad headache, ear pain, sinus pain, or chest pain.   You have a high-pitched whistling sound when you breathe in and out (wheezing).   You have a lasting cough or cough up blood.   You have sore muscles or a stiff neck.  MAKE SURE YOU:   Understand these instructions.   Will watch your condition.   Will get help right away if you are not doing well or get worse.  Document Released: 07/15/2007 Document Revised: 10/08/2010 Document Reviewed: 06/02/2010 [facility] Patient Information 2012 [place], [place].Therapy after a Stroke, Adult Your brain cells need a steady supply of blood and oxygen to work normally. A stroke comes from a sudden end of blood flow to the brain. Your brain cells may start to die within minutes after blood flow is interrupted. The loss of brain  function can cause problems with speech, vision, memory, or movement. The severity of the stroke depends on which part of your brain is affected and how big an area is harmed. There are two main types of stroke:  Ischemic stroke is caused by a blockage in arteries (blood vessels carrying oxygen rich blood) supplying the brain. This is the most common kind of stroke.   Hemorrhagic stroke is occurs when a blood vessel in your brain bursts and causes bleeding into your brain.  A stroke can cause many types of problems. The treatment of stroke involves 3 stages. The 3 stages are prevention, treatment immediately following a stroke, and rehabilitation after a stroke. BEFORE THERAPY BEGINS A detailed exam by your caregiver helps outline what problems were caused by the stroke. Your caregiver may ask specialists about this. The specialists may include doctors, occupational therapists, physical therapists, and speech therapists. It is then possible to make a plan that best fits your needs. Your evaluation might include the following:  Your ability to do daily activities that require using muscles, coordination, vision, reasoning, memory, and problem solving. Interviews with you and your caregiver to understand what you could do and could not do before the stroke. Your ability to do personal self-care tasks, such as dressing, grooming, and eating.   Tests may be done to  see if there are sensory and motor changes due to the stroke, especially in the hands and legs.  TREATMENT   Your caregiver may start therapy right away depending on the type and severity of your stroke.   Medicines.   If a blood clot caused the stroke, you may be started on medicines that can help to dissolve the clot and stop new clots from forming.   If bleeding caused the stroke, medicines might be used to lower blood pressure and reverse the effects of any blood thinners used at the time of the stroke.   Therapy after stroke is  focused on getting function back and preventing another stroke. Therapy might include:   Physical therapy. This can include help with walking, sitting, lying down, and balance. It may also be designed to help prevent shortening of the muscles (contractures) and swelling (edema).   Occupational therapy. This therapy helps you to relearn skills needed for leading a normal life. These could include eating, using the restroom, dressing, and taking care of yourself. It helps to make you more independent.   Vision therapy. This can help you to retrain, strengthen, and improve your vision following a stroke.   Speech therapy. This can help to improve your speech and communication skills. It also teaches both you and your family members to cope with problems of being unable to communicate.   Cognitive therapy. This therapy can help with problems caused by lack of memory, attention, or concentration.   Psychological or psychiatric therapy. This can help you cope with problems of frustration and emotional problems that may develop after a stroke.   Blood pressure control. If your blood pressure is high, it is important to lower it to a normal pressure.   Smoking cessation. If you smoke, it is important to get help to stop smoking.   Medicines to lower cholesterol (antilipidemics) are sometimes prescribed to reduce the amount of fat (plaque) in the blood. The reduction of plaque on artery walls can decrease the risk of a future stroke.  HOME CARE INSTRUCTIONS   Follow a healthy diet as directed by your caregiver.   Monitor and control your blood sugar levels, if you are a person with diabetes.   Reduce the amount of sodium and fat in your diet.   Quit smoking.   Exercise regularly, in moderation.   Take the necessary prescribed medicines as directed by your caregiver.   Continue your rehabilitation program as directed by your caregiver.   Get your blood pressure and cholesterol levels checked  regularly.  SEEK MEDICAL CARE IF:   You feel you are having problems with medicines prescribed.   You are not improving as expected.  SEEK IMMEDIATE MEDICAL CARE IF:   You have sudden weakness or numbness of the face, arm, or leg, especially on one side of the body.   You have sudden confusion.   You have trouble speaking (aphasia) or understanding.   You have sudden trouble seeing in 1 or both eyes.   You have sudden trouble walking.   You have dizziness.   You have a loss of balance or coordination.   You have a sudden severe headache with no known cause.   You have an oral temperature above 102 F (38.9 C), not controlled by medicine.   You are coughing or have difficulty breathing.   You have new chest pain, angina, or an irregular heartbeat.  ANY OF THESE SYMPTOMS MAY REPRESENT A SERIOUS PROBLEM THAT IS AN  EMERGENCY. Do not wait to see if the symptoms will go away. Get medical help at once. Call your local emergency services (911 in the [place] DO NOT drive yourself to the hospital. Document Released: 02/15/2007 Document Revised: 10/08/2010 Document Reviewed: 02/15/2007 [facility] Patient Information 2012 [place], [place].

## 2011-03-10 LAB — URINALYSIS, ROUTINE W REFLEX MICROSCOPIC
Bilirubin Urine: NEGATIVE
Hgb urine dipstick: NEGATIVE
Leukocytes, UA: NEGATIVE
Nitrite: NEGATIVE
Total Protein, Urine: NEGATIVE
Urobilinogen, UA: 0.2 (ref 0.0–1.0)

## 2011-03-10 LAB — TSH: TSH: 0.61 u[IU]/mL (ref 0.35–5.50)

## 2011-03-11 ENCOUNTER — Encounter: Payer: Self-pay | Admitting: Neurology

## 2011-03-19 ENCOUNTER — Ambulatory Visit: Payer: Medicare Other | Admitting: Family Medicine

## 2011-03-19 ENCOUNTER — Telehealth: Payer: Self-pay | Admitting: *Deleted

## 2011-03-19 MED ORDER — ZOLPIDEM TARTRATE 5 MG PO TABS: 2.5000 mg | ORAL_TABLET | Freq: Every evening | ORAL | Status: DC | PRN

## 2011-03-19 NOTE — Telephone Encounter (Signed)
We will give her rx #30  But please tell her I don't want her to take it every night---its very addictive---try to only take 2-3 x a week

## 2011-03-19 NOTE — Telephone Encounter (Signed)
Tried calling patient but the line was busy----Msg on script to take 2-3 days a week. Verbal order left on Voicemail for Alona Bene for continued PT     KP

## 2011-03-19 NOTE — Telephone Encounter (Signed)
Pt states that she was given Ambien in the hospital to help with sleep now that she is home she has taken the last pill and would like to know if dr Laury Axon is willing to Rx med.   Called from home care agency stating that Dr Roseanne Reno order physical therapy for the Pt that is about to end but she does not feel that Pt is ready to be discharge, so they would like to get orders to continue with physical therapy 2 x a week for 4 more weeks.Please advise

## 2011-03-20 ENCOUNTER — Telehealth: Payer: Self-pay | Admitting: Family Medicine

## 2011-03-20 MED ORDER — LEVOTHYROXINE SODIUM 75 MCG PO TABS: 75.0000 ug | ORAL_TABLET | Freq: Every day | ORAL | Status: DC

## 2011-03-20 MED ORDER — PRAVASTATIN SODIUM 20 MG PO TABS: 20.0000 mg | ORAL_TABLET | Freq: Every day | ORAL | Status: DC

## 2011-03-20 NOTE — Telephone Encounter (Signed)
Refill: Synthroid 75 mcg tablet. Take 1 tablet by mouth every day. Qty. 30. Last fill 02-20-11

## 2011-03-20 NOTE — Telephone Encounter (Signed)
Refill: Pravastatin Sodium 20 mg tab. Take 1 tablet by mouth at bedtime, Qty. 30. Last fill 02-20-11

## 2011-03-24 ENCOUNTER — Ambulatory Visit: Payer: Medicare Other | Admitting: Physician Assistant

## 2011-03-31 ENCOUNTER — Ambulatory Visit: Payer: Medicare Other | Admitting: Cardiology

## 2011-04-10 ENCOUNTER — Telehealth: Payer: Self-pay | Admitting: Family Medicine

## 2011-04-10 NOTE — Telephone Encounter (Signed)
Patient would like to be seen on the afternoon of 3/11, for the following  speak about BP/dehydration/potassium  However I feel this is a . Appointment & I do not show anything available. Can you check behind me to be sure that it is a 30 min appt.? Please & thanks [name] ph# [phone]

## 2011-04-10 NOTE — Telephone Encounter (Signed)
You can put her in at 2:30 since the 2:45 slot is blocked      KP

## 2011-04-14 ENCOUNTER — Ambulatory Visit: Payer: Medicare Other

## 2011-04-20 ENCOUNTER — Encounter: Payer: Self-pay | Admitting: Neurology

## 2011-04-20 ENCOUNTER — Ambulatory Visit (INDEPENDENT_AMBULATORY_CARE_PROVIDER_SITE_OTHER): Payer: Medicare Other | Admitting: Neurology

## 2011-04-20 VITALS — BP 118/72 | HR 100 | Ht 63.0 in | Wt 142.0 lb

## 2011-04-20 DIAGNOSIS — R51 Headache: Secondary | ICD-10-CM

## 2011-04-20 DIAGNOSIS — I609 Nontraumatic subarachnoid hemorrhage, unspecified: Secondary | ICD-10-CM

## 2011-04-20 NOTE — Progress Notes (Signed)
Dear [name],  Thank you for having me see [name] in consultation today at [facility] for her problem with left frontal ischemic stroke.  As you may recall, she is a 78 y.o. 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  There was some mild aphasia and right leg weakness with it.  She was found to have a left frontal acute infarction, MRA head was negative except for the PCOM aneurysm(which cannot be repaired) and carotid dopplers were negative as well.  Patient cannot take aspirin or Coumadin because of the aneurysm.  Past Medical History  Diagnosis Date  . Osteopenia   . History of cervical cancer   . Benign neoplasm of adrenal gland   . Hypothyroidism   . COPD (chronic obstructive pulmonary disease)   . Esophageal reflux   . Osteoarthrosis, unspecified whether generalized or localized, unspecified site   . Unspecified pleural effusion   . Subarachnoid hemorrhage     No coumadin or ASA-[facility].Marland Kitchenaneurysm..no treatment  . Hypertension   . Hyperlipidemia   . Personal history of venous thrombosis and embolism     No coumadin because of Subarachnoid hemorrhage  . Unspecified disorder resulting from impaired renal function   . Congestive heart failure, unspecified     EF 55-60%, echo, March, 2011  . GERD (gastroesophageal reflux disease)   . Aortic insufficiency     Mild, echo, March, 2011  . Mitral regurgitation     Mild, echo, March, 2011  . Aortic root dilatation     4.5 cm echo, November, 2010 / no mention  . Dizziness   . Anemia   . Chronic kidney disease   . Pulmonary embolism     Also DVT but no Coumadin because of subarachnoid hemorrhage  . Atrial fibrillation     cannot take Coumadin /  Holter May, 2011.. mild bradycardia digoxin stopped / September, 2011, shortness of breath and  palpitations.. digoxin resume /  March, 2012 can't find it digoxin on med list... to be restarted  . Shingles     November, 2011  . Ejection fraction     55-60%, echo, March, 2011  . Shortness of breath     Nuclear 07/11/2010,,73%, no ischemia  . Renal insufficiency   . Macular degeneration (senile) of retina, unspecified   . Myalgia and myositis, unspecified   . B12 deficiency anemia   . Pulmonary embolism   . Stroke     02/09/11  . Brain aneurysm 2007    Past Surgical History  Procedure Date  . Foot surgery   . Cataract extraction   . Cholecystectomy   . Pulmonary embolism surgery     vena cava filter  . Orif hip fracture     07/2007  . Epidural block injection     11/2009  . Abdominal hysterectomy   . Appendectomy   . Total hip arthroplasty     History   Social History  . Marital Status: Widowed    Spouse Name: N/A    Number of Children: N/A  . Years of Education: N/A   Occupational History  . Retired    Social History Main Topics  . Smoking status: Never Smoker   . Smokeless tobacco: Never Used  . Alcohol Use: No  . Drug Use: No  . Sexually  Active: None   Other Topics Concern  . None   Social History Narrative  . None    Family History  Problem Relation Age of Onset  . Aortic aneurysm Brother   . Breast cancer Sister     1st degree relative <50  . Coronary artery disease Mother     1st degree relative <60  . Stroke Father     Female- 1st degree relative <50    Current Outpatient Prescriptions on File Prior to Visit  Medication Sig Dispense Refill  . amLODipine (NORVASC) 5 MG tablet TAKE 1 TABLET BY MOUTH DAILY  30 tablet  10  . B Complex-C (B-COMPLEX WITH VITAMIN C) tablet Take 1 tablet by mouth daily.        . Calcium Carbonate-Vit D-Min 600-400 MG-UNIT TABS Take 1 tablet by mouth daily.        . cyanocobalamin (,VITAMIN B-12,) 1000 MCG/ML injection Inject 1,000 mcg into the muscle every 30 (thirty) days.        Marland Kitchen diltiazem (CARDIZEM CD) 240  MG 24 hr capsule Take 1 capsule (240 mg total) by mouth daily.  30 capsule  6  . furosemide (LASIX) 40 MG tablet Take 1 tablet (40 mg total) by mouth 2 (two) times daily.  60 tablet  6  . lactase (LACTAID) 3000 UNITS tablet Take 1 tablet by mouth daily.        Marland Kitchen levothyroxine (SYNTHROID) 75 MCG tablet Take 1 tablet (75 mcg total) by mouth daily.  30 tablet  5  . metoprolol succinate (TOPROL-XL) 25 MG 24 hr tablet TAKE ONE TABLET BY MOUTH DAILY  30 tablet  11  . Misc Natural Products (OSTEO BI-FLEX ADV TRIPLE ST) TABS Take 1 tablet by mouth daily.        . Multiple Vitamin (MULTIVITAMIN) tablet Take 1 tablet by mouth daily.        . pravastatin (PRAVACHOL) 20 MG tablet Take 1 tablet (20 mg total) by mouth daily.  30 tablet  2  . Probiotic Product (ALIGN) 4 MG CAPS Take 1 capsule by mouth daily.        Marland Kitchen Propylene Glycol (SYSTANE BALANCE OP) Place 1 drop into both eyes 2 (two) times daily.        Marland Kitchen zolpidem (AMBIEN) 5 MG tablet Take 0.5 tablets (2.5 mg total) by mouth at bedtime as needed.  30 tablet  0    Allergies  Allergen Reactions  . Contrast Media (Iodinated Diagnostic Agents) Other (See Comments)    No Contrast due to cerebral aneurysm per patient  . Ciprofloxacin     REACTION: itching , diarrhea  . Hydrocodone-Acetaminophen     REACTION: hallucination      ROS:  13 systems were reviewed and are notable for nasal congestion, drainage, balance problems, irregular pulse, swelling in hands and feet, shortness of breath, incontinence, broken bones, arm weakness, joint pain.  All other review of systems are unremarkable.   Examination:  Filed Vitals:   04/20/11 1049  BP: 118/72  Pulse: 100  Height: 5\' 3"  (1.6 m)  Weight: 142 lb (64.411 kg)     In general, well appearing older women.  Cardiovascular: The patient has a IRRR and no carotid bruits.  Fundoscopy:  Disks are flat. Vessel caliber within normal limits.  She does appear to have pallor of the right disk, and a dark  colored material which appears to be a the junction of an artery on the disk.  Mental status:  The patient is oriented to person, place and time. Recent and remote memory are intact. Attention span and concentration are normal. Language including repetition, naming, following commands are intact. Fund of knowledge of current and historical events, as well as vocabulary are normal.  Cranial Nerves: Pupils are equally round and reactive to light. Visual fields full to confrontation. Extraocular movements are intact without nystagmus. Facial sensation and muscles of mastication are intact. Muscles of facial expression are symmetric. Hearing decreased to bilateral finger rub. Tongue protrusion, uvula, palate midline.  Shoulder shrug intact  Motor:  The patient has normal bulk and tone, mild right pronator drift. There are no adventitious movements.  5/5 except for right wrist extension and flexion 4/5 and weak intrinsic muscles of the hand.  Reflexes:   Biceps  Triceps Brachioradialis Knee Ankle  Right 3+  3+  3+   2+ 0  Left  2+  2+  2+   2+ 0  Toes down  Coordination:  Normal finger to nose.  No dysdiadokinesia.  Sensation is to vibration is decreased in the feet.  Otherwise sensation appears symmetric.  No neglect.  Gait and Station are mildly unstable.  No clear ataxia.   Romberg is negative  MRI brain reviewed.  Old left parietal stroke and new left frontal, just anterior to precentral gyrus.  MRA unremarkable except for aneurysm.  Impression/Recs 1.  Left frontal stroke with residual hand weakness likely secondary to Afib.    Unfortunately the fact she had a SAH prevents her from anti-platelet therapy or anti- coagulation.  Not much we can do.   She may continue to get improvement from her stroke with continued physical therapy. 2.  ?Embolus in right eye - I am not sure if this represents embolic materal(it doesn't look like it).  She is going to follow up with her ophthalmologist  for an exam.  This would not change management but would support that her stroke likely came from atrial fibrillation.  We can see her back on an as needed basis.  Thank you for having [place] see [name] in consultation.  Feel free to contact me with any questions.  Lupita Raider Modesto Charon, MD [facility], [place] [address] [place], [place] [address] Phone: [phone] Fax: [phone].

## 2011-04-20 NOTE — Progress Notes (Signed)
-   difficulty speaking - getting therapy - taping 5th to 4th digit - did affect leg - turns in

## 2011-04-21 ENCOUNTER — Ambulatory Visit: Payer: Medicare Other

## 2011-04-22 ENCOUNTER — Other Ambulatory Visit: Payer: Self-pay | Admitting: Family Medicine

## 2011-04-22 ENCOUNTER — Ambulatory Visit (INDEPENDENT_AMBULATORY_CARE_PROVIDER_SITE_OTHER): Payer: Medicare Other | Admitting: *Deleted

## 2011-04-22 DIAGNOSIS — I639 Cerebral infarction, unspecified: Secondary | ICD-10-CM

## 2011-04-22 DIAGNOSIS — E538 Deficiency of other specified B group vitamins: Secondary | ICD-10-CM

## 2011-04-22 MED ORDER — CYANOCOBALAMIN 1000 MCG/ML IJ SOLN
1000.0000 ug | Freq: Once | INTRAMUSCULAR | Status: AC
  Administered 2011-04-22: 1000 ug via INTRAMUSCULAR

## 2011-04-27 ENCOUNTER — Telehealth: Payer: Self-pay | Admitting: Family Medicine

## 2011-04-27 NOTE — Telephone Encounter (Signed)
In reference to PT referral to [facility] office per patient request, Per call from April this morning, 04-27-11, at [facility] office, she contacted patient, advised her that for her problem, she really needs to be seen by Acupuncturist at the [facility] on [address] in [place].  Patient was upset by this, and that she couldn't be seen at [facility] location, and states it's due to transportation.  However, per April, the patient will most benefit & her time used most wisely to be seen by OT, but April unable to get patient to agree.  Per April, to get patient back where she needs to be would not take that many visits, any way to get patient to agree???  Please advise.

## 2011-04-28 NOTE — Telephone Encounter (Signed)
Would she qualify for med link--- for transportation

## 2011-04-30 NOTE — Telephone Encounter (Signed)
VM left on medlink [facility]) with patient information to see if she qualifies..waiting on a return call.     KP

## 2011-05-07 NOTE — Telephone Encounter (Signed)
Correspondence received advising [facility] will be caring for the patient's transportation needs.    KP

## 2011-05-13 ENCOUNTER — Encounter: Payer: Self-pay | Admitting: Cardiology

## 2011-05-13 DIAGNOSIS — I671 Cerebral aneurysm, nonruptured: Secondary | ICD-10-CM | POA: Insufficient documentation

## 2011-05-13 DIAGNOSIS — I639 Cerebral infarction, unspecified: Secondary | ICD-10-CM | POA: Insufficient documentation

## 2011-05-14 ENCOUNTER — Encounter: Payer: Self-pay | Admitting: Cardiology

## 2011-05-14 ENCOUNTER — Ambulatory Visit (INDEPENDENT_AMBULATORY_CARE_PROVIDER_SITE_OTHER): Payer: Medicare Other | Admitting: Cardiology

## 2011-05-14 VITALS — BP 139/92 | HR 86 | Resp 18 | Ht 64.0 in | Wt 140.1 lb

## 2011-05-14 DIAGNOSIS — I639 Cerebral infarction, unspecified: Secondary | ICD-10-CM

## 2011-05-14 DIAGNOSIS — I6350 Cerebral infarction due to unspecified occlusion or stenosis of unspecified cerebral artery: Secondary | ICD-10-CM

## 2011-05-14 DIAGNOSIS — I4891 Unspecified atrial fibrillation: Secondary | ICD-10-CM

## 2011-05-14 NOTE — Assessment & Plan Note (Signed)
Atrial fibrillation controlled. No change in therapy. Unfortunately we cannot use Coumadin because of her history of a subarachnoid hemorrhage that was spontaneous in the past.

## 2011-05-14 NOTE — Assessment & Plan Note (Signed)
Patient is doing well after her stroke. No change in therapy.

## 2011-05-14 NOTE — Progress Notes (Signed)
HPI Patient is seen for cardiology followup the followup her atrial fibrillation.I had seen her last November, 2012. Since that time she has had a CVA and she is recovering very nicely. He was felt to be embolic. Unfortunately she cannot be coumadinized because of her history of a subarachnoid hemorrhage. She did not require surgery. There was an aneurysm. It was recommended that she not receive Coumadin or aspirin. Therefore we have to watch her atrial fib.She is in good spirits. She's recovered very nicely from her stroke. She's not having any marked shortness of breath or chest pain.  Allergies  Allergen Reactions  . Contrast Media (Iodinated Diagnostic Agents) Other (See Comments)    No Contrast due to cerebral aneurysm per patient  . Ciprofloxacin     REACTION: itching , diarrhea  . Hydrocodone-Acetaminophen     REACTION: hallucination    Current Outpatient Prescriptions  Medication Sig Dispense Refill  . amLODipine (NORVASC) 5 MG tablet TAKE 1 TABLET BY MOUTH DAILY  30 tablet  10  . B Complex-C (B-COMPLEX WITH VITAMIN C) tablet Take 1 tablet by mouth daily.        . Calcium Carbonate-Vit D-Min 600-400 MG-UNIT TABS Take 1 tablet by mouth daily.        . cyanocobalamin (,VITAMIN B-12,) 1000 MCG/ML injection Inject 1,000 mcg into the muscle every 30 (thirty) days.        Marland Kitchen diltiazem (CARDIZEM CD) 240 MG 24 hr capsule Take 1 capsule (240 mg total) by mouth daily.  30 capsule  6  . furosemide (LASIX) 40 MG tablet Take 1 tablet (40 mg total) by mouth 2 (two) times daily.  60 tablet  6  . lactase (LACTAID) 3000 UNITS tablet Take 1 tablet by mouth daily.        Marland Kitchen levothyroxine (SYNTHROID) 75 MCG tablet Take 1 tablet (75 mcg total) by mouth daily.  30 tablet  5  . metoprolol succinate (TOPROL-XL) 25 MG 24 hr tablet TAKE ONE TABLET BY MOUTH DAILY  30 tablet  11  . Misc Natural Products (OSTEO BI-FLEX ADV TRIPLE ST) TABS Take 1 tablet by mouth daily.        . Multiple Vitamin (MULTIVITAMIN)  tablet Take 1 tablet by mouth daily.        . pravastatin (PRAVACHOL) 20 MG tablet Take 1 tablet (20 mg total) by mouth daily.  30 tablet  2  . Probiotic Product (ALIGN) 4 MG CAPS Take 1 capsule by mouth daily.        Marland Kitchen Propylene Glycol (SYSTANE BALANCE OP) Place 1 drop into both eyes 2 (two) times daily.        Marland Kitchen zolpidem (AMBIEN) 5 MG tablet Take 0.5 tablets (2.5 mg total) by mouth at bedtime as needed.  30 tablet  0    History   Social History  . Marital Status: Widowed    Spouse Name: N/A    Number of Children: N/A  . Years of Education: N/A   Occupational History  . Retired    Social History Main Topics  . Smoking status: Never Smoker   . Smokeless tobacco: Never Used  . Alcohol Use: No  . Drug Use: No  . Sexually Active: Not on file   Other Topics Concern  . Not on file   Social History Narrative  . No narrative on file    Family History  Problem Relation Age of Onset  . Aortic aneurysm Brother   . Breast cancer Sister  1st degree relative <50  . Coronary artery disease Mother     1st degree relative <60  . Stroke Father     Female- 1st degree relative <50    Past Medical History  Diagnosis Date  . Osteopenia   . History of cervical cancer   . Benign neoplasm of adrenal gland   . Hypothyroidism   . COPD (chronic obstructive pulmonary disease)   . Esophageal reflux   . Osteoarthrosis, unspecified whether generalized or localized, unspecified site   . Unspecified pleural effusion   . Subarachnoid hemorrhage     No coumadin or ASA-Dr.[facility].Marland Kitchenaneurysm..no treatment  . Hypertension   . Hyperlipidemia   . Personal history of venous thrombosis and embolism     No coumadin because of Subarachnoid hemorrhage  . Unspecified disorder resulting from impaired renal function   . Congestive heart failure, unspecified     EF 55-60%, echo, March, 2011  . GERD (gastroesophageal reflux disease)   . Aortic insufficiency     Mild, echo, March, 2011  .  Mitral regurgitation     Mild, echo, March, 2011  . Aortic root dilatation     4.5 cm echo, November, 2010 / no mention  . Dizziness   . Anemia   . Chronic kidney disease   . Pulmonary embolism     Also DVT but no Coumadin because of subarachnoid hemorrhage  . Atrial fibrillation     cannot take Coumadin /  Holter May, 2011.. mild bradycardia digoxin stopped / September, 2011, shortness of breath and palpitations.. digoxin resume /  March, 2012 can't find it digoxin on med list... to be restarted  . Shingles     November, 2011  . Ejection fraction     55-60%, echo, March, 2011  . Shortness of breath     Nuclear 07/11/2010,,73%, no ischemia  . Renal insufficiency   . Macular degeneration (senile) of retina, unspecified   . Myalgia and myositis, unspecified   . B12 deficiency anemia   . Pulmonary embolism   . Stroke     December, 2012, probably embolic from atrial fib, patient cannot be treated because of history of an aneurysm.  . Brain aneurysm 2007    Past Surgical History  Procedure Date  . Foot surgery   . Cataract extraction   . Cholecystectomy   . Pulmonary embolism surgery     vena cava filter  . Orif hip fracture     07/2007  . Epidural block injection     11/2009  . Abdominal hysterectomy   . Appendectomy   . Total hip arthroplasty     ROS  Patient denies fever, chills, headache, sweats, rash, change in vision, change in hearing, chest pain, cough, nausea vomiting, urinary symptoms. All other systems are reviewed and are negative.  PHYSICAL EXAM Patient is oriented to person time and place. Affect is normal. She is here with her sister. She is in good spirits. Lungs are clear. Respiratory effort is nonlabored. Cardiac exam reveals S1 and S2. The rhythm is irregularly irregular. The abdomen is soft. There is no peripheral edema. The 2 small fingers on her right hand are taped.  Filed Vitals:   05/14/11 1106  BP: 139/92  Pulse: 86  Resp: 18  Height: 5\' 4"   (1.626 m)  Weight: 140 lb 1.9 oz (63.558 kg)     ASSESSMENT & PLAN

## 2011-05-14 NOTE — Patient Instructions (Signed)
Your physician wants you to follow-up in:  6 months. You will receive a reminder letter in the mail two months in advance. If you don't receive a letter, please call our office to schedule the follow-up appointment.   

## 2011-05-18 ENCOUNTER — Other Ambulatory Visit: Payer: Self-pay

## 2011-05-18 MED ORDER — ZOLPIDEM TARTRATE 5 MG PO TABS: 2.5000 mg | ORAL_TABLET | Freq: Every evening | ORAL | Status: DC | PRN

## 2011-05-18 NOTE — Telephone Encounter (Signed)
Faxed.   KP 

## 2011-05-18 NOTE — Telephone Encounter (Signed)
Refill x1 

## 2011-05-18 NOTE — Telephone Encounter (Signed)
Last seen 03/09/11 and filled 03/19/11 # 30 with no refills. Please advise     KP

## 2011-05-25 ENCOUNTER — Ambulatory Visit: Payer: Medicare Other | Attending: Family Medicine | Admitting: Rehabilitative and Restorative Service Providers"

## 2011-05-25 ENCOUNTER — Ambulatory Visit (INDEPENDENT_AMBULATORY_CARE_PROVIDER_SITE_OTHER): Payer: Medicare Other | Admitting: *Deleted

## 2011-05-25 DIAGNOSIS — I69998 Other sequelae following unspecified cerebrovascular disease: Secondary | ICD-10-CM | POA: Insufficient documentation

## 2011-05-25 DIAGNOSIS — R5381 Other malaise: Secondary | ICD-10-CM | POA: Insufficient documentation

## 2011-05-25 DIAGNOSIS — R279 Unspecified lack of coordination: Secondary | ICD-10-CM | POA: Insufficient documentation

## 2011-05-25 DIAGNOSIS — M6281 Muscle weakness (generalized): Secondary | ICD-10-CM | POA: Insufficient documentation

## 2011-05-25 DIAGNOSIS — Z5189 Encounter for other specified aftercare: Secondary | ICD-10-CM | POA: Insufficient documentation

## 2011-05-25 DIAGNOSIS — R269 Unspecified abnormalities of gait and mobility: Secondary | ICD-10-CM | POA: Insufficient documentation

## 2011-05-25 DIAGNOSIS — E538 Deficiency of other specified B group vitamins: Secondary | ICD-10-CM

## 2011-05-25 MED ORDER — CYANOCOBALAMIN 1000 MCG/ML IJ SOLN
1000.0000 ug | Freq: Once | INTRAMUSCULAR | Status: AC
  Administered 2011-05-25: 1000 ug via INTRAMUSCULAR

## 2011-05-31 ENCOUNTER — Other Ambulatory Visit: Payer: Self-pay | Admitting: Cardiology

## 2011-06-01 ENCOUNTER — Ambulatory Visit: Payer: Medicare Other | Admitting: Occupational Therapy

## 2011-06-09 ENCOUNTER — Ambulatory Visit: Payer: Medicare Other | Admitting: Rehabilitative and Restorative Service Providers"

## 2011-06-09 ENCOUNTER — Ambulatory Visit: Payer: Medicare Other | Admitting: Occupational Therapy

## 2011-06-10 ENCOUNTER — Ambulatory Visit: Payer: Medicare Other | Attending: Family Medicine | Admitting: Occupational Therapy

## 2011-06-10 ENCOUNTER — Ambulatory Visit (INDEPENDENT_AMBULATORY_CARE_PROVIDER_SITE_OTHER): Payer: Medicare Other | Admitting: Family Medicine

## 2011-06-10 ENCOUNTER — Ambulatory Visit: Payer: Medicare Other | Admitting: Physical Therapy

## 2011-06-10 ENCOUNTER — Encounter: Payer: Self-pay | Admitting: Family Medicine

## 2011-06-10 VITALS — BP 130/76 | HR 84 | Temp 98.2°F | Wt 141.6 lb

## 2011-06-10 DIAGNOSIS — E538 Deficiency of other specified B group vitamins: Secondary | ICD-10-CM

## 2011-06-10 DIAGNOSIS — D518 Other vitamin B12 deficiency anemias: Secondary | ICD-10-CM

## 2011-06-10 DIAGNOSIS — F329 Major depressive disorder, single episode, unspecified: Secondary | ICD-10-CM

## 2011-06-10 DIAGNOSIS — R5381 Other malaise: Secondary | ICD-10-CM

## 2011-06-10 DIAGNOSIS — E785 Hyperlipidemia, unspecified: Secondary | ICD-10-CM

## 2011-06-10 DIAGNOSIS — R279 Unspecified lack of coordination: Secondary | ICD-10-CM | POA: Insufficient documentation

## 2011-06-10 DIAGNOSIS — I69998 Other sequelae following unspecified cerebrovascular disease: Secondary | ICD-10-CM | POA: Insufficient documentation

## 2011-06-10 DIAGNOSIS — R5383 Other fatigue: Secondary | ICD-10-CM

## 2011-06-10 DIAGNOSIS — F32A Depression, unspecified: Secondary | ICD-10-CM | POA: Insufficient documentation

## 2011-06-10 DIAGNOSIS — I10 Essential (primary) hypertension: Secondary | ICD-10-CM

## 2011-06-10 DIAGNOSIS — R143 Flatulence: Secondary | ICD-10-CM

## 2011-06-10 DIAGNOSIS — R269 Unspecified abnormalities of gait and mobility: Secondary | ICD-10-CM | POA: Insufficient documentation

## 2011-06-10 DIAGNOSIS — Z5189 Encounter for other specified aftercare: Secondary | ICD-10-CM | POA: Insufficient documentation

## 2011-06-10 DIAGNOSIS — E039 Hypothyroidism, unspecified: Secondary | ICD-10-CM

## 2011-06-10 DIAGNOSIS — I4891 Unspecified atrial fibrillation: Secondary | ICD-10-CM

## 2011-06-10 DIAGNOSIS — I359 Nonrheumatic aortic valve disorder, unspecified: Secondary | ICD-10-CM

## 2011-06-10 DIAGNOSIS — M6281 Muscle weakness (generalized): Secondary | ICD-10-CM | POA: Insufficient documentation

## 2011-06-10 MED ORDER — SERTRALINE HCL 50 MG PO TABS: 50.0000 mg | ORAL_TABLET | Freq: Every day | ORAL | Status: DC

## 2011-06-10 NOTE — Assessment & Plan Note (Signed)
Per cardio 

## 2011-06-10 NOTE — Assessment & Plan Note (Signed)
con't meds See labs

## 2011-06-10 NOTE — Progress Notes (Signed)
  Subjective:    Patient ID: [name], female    DOB: 08-Mar-1918, 78 y.o.   MRN: [id number]  HPI Pt here for 6 month f/u htn, hypothyroidism, hyperlipidemia,  b12 deficiency.   Pt recently c/o excess gas and states she has had no change in diet etc.  No abd pain , no nvd.  Pt also c/o feeling of impending doom.  She spoke with hand therapist about it yesterday and she advised she come in to talk to me about it.   Pt denies feeling depressed but still c/o fatigue and is sick and tired of being tired.   No chest pain or inc sob.     Review of Systems as above   Objective:   Physical Exam  Constitutional: She is oriented to person, place, and time. She appears well-developed and well-nourished.  Neck: Normal range of motion. Neck supple. No thyromegaly present.  Cardiovascular:       irreg , irreg  Pulmonary/Chest: Effort normal and breath sounds normal. No respiratory distress. She has no wheezes. She has no rales.  Musculoskeletal: She exhibits no edema.  Neurological: She is alert and oriented to person, place, and time.  Psychiatric: She has a normal mood and affect. Her behavior is normal.       Pt denies feeling suicidal or homocidal           Assessment & Plan:

## 2011-06-10 NOTE — Patient Instructions (Signed)

## 2011-06-10 NOTE — Assessment & Plan Note (Addendum)
zoloft 50 mg 1 po qd  #30  --- take 1/2 tab for 8 days then inc to 1 tab daily rto 1 month or sooner prn

## 2011-06-10 NOTE — Assessment & Plan Note (Signed)
Need to check labs con't meds

## 2011-06-10 NOTE — Assessment & Plan Note (Signed)
Pt will try SL B12 Recheck 3 months

## 2011-06-10 NOTE — Assessment & Plan Note (Signed)
Pt will try otc meds first--- beano , gas x  Watch diet closely  rto prn

## 2011-06-10 NOTE — Assessment & Plan Note (Signed)
con't meds stable 

## 2011-06-15 ENCOUNTER — Ambulatory Visit: Payer: Medicare Other | Admitting: Rehabilitative and Restorative Service Providers"

## 2011-06-15 ENCOUNTER — Ambulatory Visit: Payer: Medicare Other | Admitting: Occupational Therapy

## 2011-06-17 ENCOUNTER — Ambulatory Visit: Payer: Medicare Other | Admitting: Occupational Therapy

## 2011-06-17 ENCOUNTER — Ambulatory Visit: Payer: Medicare Other | Admitting: Rehabilitative and Restorative Service Providers"

## 2011-06-22 ENCOUNTER — Encounter: Payer: Medicare Other | Admitting: Occupational Therapy

## 2011-06-22 ENCOUNTER — Ambulatory Visit: Payer: Medicare Other | Admitting: Rehabilitative and Restorative Service Providers"

## 2011-06-24 ENCOUNTER — Ambulatory Visit: Payer: Medicare Other

## 2011-06-25 ENCOUNTER — Ambulatory Visit: Payer: Medicare Other | Admitting: Physical Therapy

## 2011-06-25 ENCOUNTER — Ambulatory Visit: Payer: Medicare Other | Admitting: Occupational Therapy

## 2011-06-29 ENCOUNTER — Ambulatory Visit: Payer: Medicare Other | Admitting: Rehabilitative and Restorative Service Providers"

## 2011-06-29 ENCOUNTER — Encounter: Payer: Medicare Other | Admitting: Occupational Therapy

## 2011-07-01 ENCOUNTER — Ambulatory Visit: Payer: Medicare Other | Admitting: Rehabilitative and Restorative Service Providers"

## 2011-07-01 ENCOUNTER — Encounter: Payer: Medicare Other | Admitting: Occupational Therapy

## 2011-07-07 ENCOUNTER — Other Ambulatory Visit (INDEPENDENT_AMBULATORY_CARE_PROVIDER_SITE_OTHER): Payer: Medicare Other

## 2011-07-07 DIAGNOSIS — R5383 Other fatigue: Secondary | ICD-10-CM

## 2011-07-07 DIAGNOSIS — I10 Essential (primary) hypertension: Secondary | ICD-10-CM

## 2011-07-07 DIAGNOSIS — E538 Deficiency of other specified B group vitamins: Secondary | ICD-10-CM

## 2011-07-07 DIAGNOSIS — E785 Hyperlipidemia, unspecified: Secondary | ICD-10-CM

## 2011-07-07 DIAGNOSIS — E039 Hypothyroidism, unspecified: Secondary | ICD-10-CM

## 2011-07-07 LAB — CBC WITH DIFFERENTIAL/PLATELET
Basophils Relative: 0.4 % (ref 0.0–3.0)
Eosinophils Absolute: 0.2 10*3/uL (ref 0.0–0.7)
Eosinophils Relative: 3.6 % (ref 0.0–5.0)
Hemoglobin: 13.1 g/dL (ref 12.0–15.0)
Lymphocytes Relative: 44.4 % (ref 12.0–46.0)
MCHC: 32.5 g/dL (ref 30.0–36.0)
MCV: 100.2 fl — ABNORMAL HIGH (ref 78.0–100.0)
Monocytes Absolute: 0.4 10*3/uL (ref 0.1–1.0)
Neutro Abs: 2 10*3/uL (ref 1.4–7.7)
Neutrophils Relative %: 42.8 % — ABNORMAL LOW (ref 43.0–77.0)
RBC: 4.03 Mil/uL (ref 3.87–5.11)
WBC: 4.6 10*3/uL (ref 4.5–10.5)

## 2011-07-07 LAB — LIPID PANEL
Cholesterol: 138 mg/dL (ref 0–200)
HDL: 61.3 mg/dL (ref >39.00)
LDL Cholesterol: 62 mg/dL (ref 0–99)
Total CHOL/HDL Ratio: 2
Triglycerides: 75 mg/dL (ref 0.0–149.0)
VLDL: 15 mg/dL (ref 0.0–40.0)

## 2011-07-07 LAB — BASIC METABOLIC PANEL
CO2: 31 mEq/L (ref 19–32)
Chloride: 104 mEq/L (ref 96–112)
Creatinine, Ser: 1.6 mg/dL — ABNORMAL HIGH (ref 0.4–1.2)
Sodium: 145 mEq/L (ref 135–145)

## 2011-07-07 LAB — HEPATIC FUNCTION PANEL
Albumin: 3.9 g/dL (ref 3.5–5.2)
Total Protein: 7.3 g/dL (ref 6.0–8.3)

## 2011-07-07 NOTE — Progress Notes (Signed)
Labs only

## 2011-07-08 ENCOUNTER — Other Ambulatory Visit: Payer: Self-pay | Admitting: Family Medicine

## 2011-07-08 DIAGNOSIS — D7289 Other specified disorders of white blood cells: Secondary | ICD-10-CM

## 2011-07-10 ENCOUNTER — Other Ambulatory Visit: Payer: Self-pay | Admitting: Family Medicine

## 2011-07-10 MED ORDER — ZOLPIDEM TARTRATE 5 MG PO TABS: 2.5000 mg | ORAL_TABLET | Freq: Every evening | ORAL | Status: DC | PRN

## 2011-07-10 NOTE — Telephone Encounter (Signed)
Last seen 06/10/11 and filled 05/18/11 #30. Please advise     KP

## 2011-07-10 NOTE — Telephone Encounter (Signed)
Faxed.   KP 

## 2011-07-10 NOTE — Telephone Encounter (Signed)
Refill: Zolpidem tartrate 5 mg tablet. Take 1/2 tablet by mouth at bedtime as needed.

## 2011-07-10 NOTE — Telephone Encounter (Signed)
Ok for #30, no refills 

## 2011-07-13 ENCOUNTER — Encounter: Payer: Self-pay | Admitting: Family Medicine

## 2011-07-13 ENCOUNTER — Ambulatory Visit (INDEPENDENT_AMBULATORY_CARE_PROVIDER_SITE_OTHER): Payer: Medicare Other | Admitting: Family Medicine

## 2011-07-13 VITALS — BP 126/72 | HR 89 | Temp 98.2°F | Wt 141.7 lb

## 2011-07-13 DIAGNOSIS — D7289 Other specified disorders of white blood cells: Secondary | ICD-10-CM

## 2011-07-13 DIAGNOSIS — R5383 Other fatigue: Secondary | ICD-10-CM

## 2011-07-13 DIAGNOSIS — R5381 Other malaise: Secondary | ICD-10-CM

## 2011-07-13 DIAGNOSIS — R197 Diarrhea, unspecified: Secondary | ICD-10-CM

## 2011-07-13 DIAGNOSIS — F329 Major depressive disorder, single episode, unspecified: Secondary | ICD-10-CM

## 2011-07-13 LAB — CBC WITH DIFFERENTIAL/PLATELET
Basophils Relative: 0.5 % (ref 0.0–3.0)
Eosinophils Absolute: 0.1 10*3/uL (ref 0.0–0.7)
Eosinophils Relative: 2.2 % (ref 0.0–5.0)
HCT: 42.7 % (ref 36.0–46.0)
Lymphs Abs: 2 10*3/uL (ref 0.7–4.0)
MCHC: 33.1 g/dL (ref 30.0–36.0)
MCV: 99.4 fl (ref 78.0–100.0)
Monocytes Absolute: 0.5 10*3/uL (ref 0.1–1.0)
RBC: 4.29 Mil/uL (ref 3.87–5.11)
WBC: 5.9 10*3/uL (ref 4.5–10.5)

## 2011-07-13 NOTE — Assessment & Plan Note (Signed)
Labs reviewed with pt Recheck cbc today Most likely due to meds and age

## 2011-07-13 NOTE — Patient Instructions (Signed)
Lactose Intolerance, Adult Lactose intolerance is when the body is not able to digest lactose, a sugar found in milk and milk products. Lactose intolerance is caused by your body not producing enough of the enzyme lactase. When there is not enough lactase to digest the amount of lactose consumed, discomfort may be felt. Lactose intolerance is not a milk allergy. For most people, lactase deficiency is a condition that develops naturally over time. After about the age of 2, the body begins to produce less lactase. But many people may not experience symptoms until they are much older. CAUSES Things that can cause you to be lactose intolerant include:  Aging.   Being born without the ability to make lactase.   Certain digestive diseases.   Injuries to the small intestine.  SYMPTOMS   Feeling sick to your stomach (nauseous).   Diarrhea.   Cramps.   Bloating.   Gas.  Symptoms usually show up a half hour or 2 hours after eating or drinking products containing lactose. TREATMENT  No treatment can improve the body's ability to produce lactase. However, symptoms can be controlled through diet. A medicine may be given to you to take when you consume lactose-containing foods or drinks. The medicine contains the lactase enzyme, which help the body digest lactose better. HOME CARE INSTRUCTIONS  Eat or drink dairy products as told by your caregiver or dietician.   Take all medicine as directed by your caregiver.   Find lactose-free or lactose-reduced products at your local grocery store.   Talk to your caregiver or dietician to decide if you need any dietary supplements.  The following is the amount of calcium needed from the diet:  19 to 50 years: 1000 mg   Over 50 years: 1200 mg  Calcium and Lactose in Common Foods Non-Dairy Products / Calcium Content (mg)  Calcium-fortified orange juice, 1 cup / 308 to 344 mg   Sardines, with edible bones, 3 oz / 270 mg   Salmon, canned, with  edible bones, 3 oz / 205 mg   Soymilk, fortified, 1 cup / 200 mg   Broccoli (raw), 1 cup / 90 mg   Orange, 1 medium / 50 mg   Pinto beans,  cup / 40 mg   Tuna, canned, 3 oz / 10 mg   Lettuce greens,  cup / 10 mg  Dairy Products / Calcium Content (mg) / Lactose Content (g)  Yogurt, plain, low-fat, 1 cup / 415 mg / 5 g   Milk, reduced fat, 1 cup / 295 mg / 11 g   Swiss cheese, 1 oz / 270 mg / 1 g   Ice cream,  cup / 85 mg / 6 g   Cottage cheese,  cup / 75 mg / 2 to 3 g  SEEK MEDICAL CARE IF: You have no relief from your symptoms. Document Released: 01/26/2005 Document Revised: 01/15/2011 Document Reviewed: 04/25/2010 [employer] Patient Information 2012 [employer]. 

## 2011-07-13 NOTE — Progress Notes (Signed)
  Subjective:    Patient ID: [name], female    DOB: [birth year], 78 y.o.   MRN: [id number]  HPI Pt is here to f/u on labs and have cbc repeated.  Pt still c/o fatigue.   No other complaints.   Review of Systems As above    Objective:   Physical Exam  Constitutional: She is oriented to person, place, and time. She appears well-nourished.  Cardiovascular:       irreg irreg  Pulmonary/Chest: Effort normal and breath sounds normal. No respiratory distress. She has no wheezes. She has no rales. She exhibits no tenderness.  Musculoskeletal: She exhibits no edema and no tenderness.  Neurological: She is alert and oriented to person, place, and time.  Skin: Skin is warm and dry.  Psychiatric: She has a normal mood and affect. Her behavior is normal. Judgment and thought content normal.          Assessment & Plan:

## 2011-07-21 ENCOUNTER — Other Ambulatory Visit: Payer: Self-pay | Admitting: Family Medicine

## 2011-07-21 DIAGNOSIS — D696 Thrombocytopenia, unspecified: Secondary | ICD-10-CM

## 2011-07-21 NOTE — Telephone Encounter (Signed)
refill zolpidem tartrate 5mg  tablet Take 1/2 tablet by mouth at bedtime as needed  Qty 30 Pharmacy shoes last fill 5.1.13  Our records shoe wrt. 5.31.13  Last ov 6.3.13

## 2011-07-21 NOTE — Telephone Encounter (Signed)
Rx not received-- phoned in to the pharmacist.      KP

## 2011-08-24 ENCOUNTER — Other Ambulatory Visit: Payer: Medicare Other | Admitting: Lab

## 2011-08-24 ENCOUNTER — Ambulatory Visit: Payer: Medicare Other | Admitting: Hematology & Oncology

## 2011-08-24 ENCOUNTER — Ambulatory Visit: Payer: Medicare Other

## 2011-08-26 ENCOUNTER — Ambulatory Visit (HOSPITAL_BASED_OUTPATIENT_CLINIC_OR_DEPARTMENT_OTHER): Payer: Medicare Other | Admitting: Hematology & Oncology

## 2011-08-26 ENCOUNTER — Ambulatory Visit: Payer: Medicare Other | Admitting: Medical

## 2011-08-26 ENCOUNTER — Other Ambulatory Visit (HOSPITAL_BASED_OUTPATIENT_CLINIC_OR_DEPARTMENT_OTHER): Payer: Medicare Other | Admitting: Lab

## 2011-08-26 ENCOUNTER — Ambulatory Visit: Payer: Medicare Other

## 2011-08-26 VITALS — BP 127/82 | HR 85 | Temp 97.0°F | Ht 64.0 in | Wt 142.0 lb

## 2011-08-26 DIAGNOSIS — D696 Thrombocytopenia, unspecified: Secondary | ICD-10-CM

## 2011-08-26 DIAGNOSIS — I4891 Unspecified atrial fibrillation: Secondary | ICD-10-CM

## 2011-08-26 DIAGNOSIS — Z8673 Personal history of transient ischemic attack (TIA), and cerebral infarction without residual deficits: Secondary | ICD-10-CM

## 2011-08-26 LAB — CBC WITH DIFFERENTIAL (CANCER CENTER ONLY)
BASO%: 0.2 % (ref 0.0–2.0)
EOS%: 3.9 % (ref 0.0–7.0)
LYMPH%: 36.7 % (ref 14.0–48.0)
MCH: 33 pg (ref 26.0–34.0)
MCHC: 33.2 g/dL (ref 32.0–36.0)
MCV: 100 fL (ref 81–101)
MONO%: 8.6 % (ref 0.0–13.0)
NEUT#: 2.7 10*3/uL (ref 1.5–6.5)
Platelets: 115 10*3/uL — ABNORMAL LOW (ref 145–400)
RDW: 13.2 % (ref 11.1–15.7)

## 2011-08-26 NOTE — Progress Notes (Signed)
This office note has been dictated.

## 2011-08-27 NOTE — Progress Notes (Signed)
CC:   Lelon Perla, DO Luis Abed, MD, [facility]  DIAGNOSIS:  Thrombocytopenia-chronic.  HISTORY OF PRESENT ILLNESS:  [name] is a very charming, 78 year old, white female.  I have seen her before.  I think I last saw her back in 2010.  At that point in time, her platelet count was 114.  She is followed by Dr. Loreen Freud.  She did have a CVA.  There is some weakness in her right hand.  She is on numerous medications.  She does have hypothyroidism.  Dr. Laury Axon has noted some thrombocytopenia.  Dr. Laury Axon has done serial lab work on her.  Going back to April 2011, her platelet count was 141. In February 2012, platelet count was 159.  Since then, her platelet count has gradually declined.  In May, a CBC was done which showed a white cell count 4.6, hemoglobin 13, hematocrit 40.4, platelet count was 109.  MCV was 100. She had a B12 level that was normal.  She had a BUN and creatinine that was somewhat elevated.  Her creatinine clearance was 33 mL/minute.  She has had a TSH which was also normal.  Again, she did have a CVA.  She has atrial fibrillation.  She is not taking any anticoagulation agents.  She has "congestive heart failure."  She sees Dr. Myrtis Ser of [facility].  She has a history of subarachnoid hemorrhage which precludes her from getting anticoagulation.  She cannot even take aspirin.  She has had no bleeding.  There has been no change in bowel or bladder habits.  She has had no rashes.  There has been no ecchymosis.  Again, Dr. Laury Axon was very kind, and referred Ms. Terhaar to the [facility] for an evaluation.  PAST MEDICAL HISTORY: 1. Remarkable for atrial fibrillation. 2. Hypothyroidism. 3. Hypertension. 4. Hyperlipidemia.  ALLERGIES: 1. Iodine. 2. Cirpofloxacin. 3. Hydrocodone.  MEDICATIONS: 1. Norvasc 5 mg p.o. daily. 2. Cardizem CD 240 mg p.o. daily. 3. Lasix 40 mg p.o. daily. 4. Synthroid 0.075 mg p.o. daily. 5.  Metoprolol-XL 25 mg p.o. daily. 6. Pravachol 20 mg p.o. daily. 7. Vitamin B12 500 mcg sublingual daily. 8. Ambien 2.5 mg p.o. q.h.s. p.r.n.  SOCIAL HISTORY:  Negative for tobacco use.  There is no alcohol use.  REVIEW OF SYSTEMS:  As stated in history of present illness.  No additional findings noted.  She does state diarrhea.  She also states that her stools are somewhat dark because she takes iron.  As stated in the history of present illness.  PHYSICAL EXAMINATION:  This is an elderly, well-nourished white female in no obvious distress.  Vital signs:  Temperature 97, pulse 85, respiratory rate 20, blood pressure 127/82.  Weight is 142.  Head and neck:  Normocephalic, atraumatic skull.  There is no scleral icterus. Pupils react appropriately.  There is no oral lesions.  She has no adenopathy in her neck.  Lungs:  Clear to percussion and auscultation bilaterally.  Cardiac:  Regular rate and rhythm consistent with atrial fibrillation.  There is no murmurs, rubs or bruits.  Abdomen:  Soft with good bowel sounds.  There is no palpable abdominal mass.  There is no fluid wave.  There is no palpable hepatosplenomegaly.  Back:  No tenderness over the spine, ribs, or hips.  Extremities:  Show some slight nonpitting edema of the lower legs.  She does have some weakness in the 4th and 5th digits of her right hand.  Skin:  Actinic  keratoses. No petechia noted.  There is no ecchymoses noted.  LABORATORY DATA:  White cell count 5.3, hemoglobin 14.2, platelet count 115.  MCV is 100.  Peripheral smear shows a normochromic, normocytic population of red blood cells.  There is no nucleated red blood cells.  I see no target cells.  There are no teardrop cells.  There is no rouleaux formation.  I see no schistocytes or spherocytes.  White cells appear normal in morphology and maturation.  There was no immature myeloid or lymphoid forms.  There is no atypical lymphocytes.  Platelets are  minimally decreased in number.  Platelets are well-granulated.  She may have a few large platelets.  IMPRESSION:  [name] is a charming 78 year old, white female with multiple medical problems.  She has chronic atrial fibrillation. She had a CVA.  She cannot be anticoagulated because of history of subarachnoid hemorrhage. Thrombocytopenia is mild.  Concern could be medication related, also could be related to underlying myelodysplasia.  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  I think the real issue here is that she is totally asymptomatic with this.  The platelet count is not low enough that it would ever cause her problems.  She will not have any increased risk of bleeding with her platelet count as it is. I saw her 3 years ago.  Her platelet count was not much different then. This is quite reassuring for me. I think that we can just plan to get her back in 4 months' time.  I do not see any indication for doing invasive studies (i.e. bone marrow biopsy).  It was nice to see Ms. Chern again, I realize she is in a tough situation, not being able to be  on anticoagulation and yet have atrial fibrillation.  We spent a good hour or so with Ms. Lynnette Caffey.  She does have a good sense of humor.    ______________________________ Josph Macho, M.D. PRE/MEDQ  D:  08/26/2011  T:  08/27/2011  Job:  [id number]

## 2011-09-28 ENCOUNTER — Other Ambulatory Visit: Payer: Self-pay | Admitting: Cardiology

## 2011-10-02 ENCOUNTER — Other Ambulatory Visit: Payer: Self-pay | Admitting: Family Medicine

## 2011-10-02 MED ORDER — ZOLPIDEM TARTRATE 5 MG PO TABS: 2.5000 mg | ORAL_TABLET | Freq: Every evening | ORAL | Status: DC | PRN

## 2011-10-02 NOTE — Telephone Encounter (Signed)
Please advise      KP 

## 2011-10-02 NOTE — Telephone Encounter (Signed)
Ok to refill x 1  

## 2011-10-02 NOTE — Telephone Encounter (Signed)
Zolpidem Tartrate (Tab) AMBIEN 5 MG Take 0.5 tablets (2.5 mg total) by mouth at bedtime as needed. Last fill stated 6.11.13   Last ov 6.3.13 ov for fatigue/depression

## 2011-10-06 ENCOUNTER — Other Ambulatory Visit: Payer: Self-pay | Admitting: Family Medicine

## 2011-11-10 ENCOUNTER — Encounter: Payer: Self-pay | Admitting: Cardiology

## 2011-11-10 DIAGNOSIS — D696 Thrombocytopenia, unspecified: Secondary | ICD-10-CM | POA: Insufficient documentation

## 2011-11-12 ENCOUNTER — Encounter: Payer: Self-pay | Admitting: Cardiology

## 2011-11-12 ENCOUNTER — Ambulatory Visit (INDEPENDENT_AMBULATORY_CARE_PROVIDER_SITE_OTHER): Payer: Medicare Other | Admitting: Cardiology

## 2011-11-12 VITALS — BP 118/68 | HR 90 | Ht 63.0 in | Wt 141.0 lb

## 2011-11-12 DIAGNOSIS — I4891 Unspecified atrial fibrillation: Secondary | ICD-10-CM

## 2011-11-12 DIAGNOSIS — R0602 Shortness of breath: Secondary | ICD-10-CM

## 2011-11-12 DIAGNOSIS — I10 Essential (primary) hypertension: Secondary | ICD-10-CM

## 2011-11-12 NOTE — Assessment & Plan Note (Signed)
Blood pressure is controlled. No change in therapy. 

## 2011-11-12 NOTE — Progress Notes (Signed)
HPI  Patient is seen today to followup atrial fibrillation area she had a CVA in the past year. She has recovered nicely. It probably was an embolic. Unfortunately she cannot be anticoagulated. She had a subarachnoid hemorrhage in the past. She is 89. Overall she's doing well. She says that she has fatigue. This is generalized. I feel it is not cardiac. She's had some chest discomfort. It sounds random. She has some soreness afterwards. I doubt that this is cardiac. Also she's had a cough. She is not on an ACE inhibitor. The cough is nonproductive.  Allergies  Allergen Reactions  . Contrast Media (Iodinated Diagnostic Agents) Other (See Comments)    No Contrast due to cerebral aneurysm per patient  . Ciprofloxacin     REACTION: itching , diarrhea  . Hydrocodone-Acetaminophen     REACTION: hallucination    Current Outpatient Prescriptions  Medication Sig Dispense Refill  . amLODipine (NORVASC) 5 MG tablet TAKE 1 TABLET BY MOUTH DAILY  30 tablet  10  . B Complex-C (B-COMPLEX WITH VITAMIN C) tablet Take 1 tablet by mouth daily.        . Calcium Carbonate-Vit D-Min 600-400 MG-UNIT TABS Take 1 tablet by mouth daily.        . cholecalciferol (VITAMIN D) 1000 UNITS tablet Take 1,000 Units by mouth daily.      Marland Kitchen diltiazem (CARDIZEM CD) 240 MG 24 hr capsule TAKE 1 CAPSULE (240 MG TOTAL) BY MOUTH DAILY.  30 capsule  8  . ferrous sulfate 325 (65 FE) MG tablet Take 325 mg by mouth daily with breakfast.      . furosemide (LASIX) 40 MG tablet       . lactase (LACTAID) 3000 UNITS tablet Take 1 tablet by mouth daily.        . Melatonin 5 MG TABS Take 2 tablets by mouth at bedtime.      . metoprolol succinate (TOPROL-XL) 25 MG 24 hr tablet TAKE ONE TABLET BY MOUTH DAILY  30 tablet  8  . Misc Natural Products (OSTEO BI-FLEX ADV TRIPLE ST) TABS Take 1 tablet by mouth daily.       . Multiple Vitamin (MULTIVITAMIN) tablet Take 1 tablet by mouth daily.        . pravastatin (PRAVACHOL) 20 MG tablet TAKE 1  TABLET (20 MG TOTAL) BY MOUTH DAILY.  30 tablet  2  . Probiotic Product (ALIGN) 4 MG CAPS Take 1 capsule by mouth daily.        Marland Kitchen Propylene Glycol (SYSTANE BALANCE OP) Place 1 drop into both eyes 2 (two) times daily.        Marland Kitchen SYNTHROID 75 MCG tablet TAKE 1 TABLET (75 MCG TOTAL) BY MOUTH DAILY.  30 tablet  5    History   Social History  . Marital Status: Divorced    Spouse Name: N/A    Number of Children: N/A  . Years of Education: N/A   Occupational History  . Retired    Social History Main Topics  . Smoking status: Never Smoker   . Smokeless tobacco: Never Used  . Alcohol Use: No  . Drug Use: No  . Sexually Active: Not on file   Other Topics Concern  . Not on file   Social History Narrative  . No narrative on file    Family History  Problem Relation Age of Onset  . Aortic aneurysm Brother   . Breast cancer Sister     1st degree relative <50  .  Coronary artery disease Mother     1st degree relative <60  . Stroke Father     Female- 1st degree relative <50    Past Medical History  Diagnosis Date  . Osteopenia   . History of cervical cancer   . Benign neoplasm of adrenal gland   . Hypothyroidism   . COPD (chronic obstructive pulmonary disease)   . Esophageal reflux   . Osteoarthrosis, unspecified whether generalized or localized, unspecified site   . Unspecified pleural effusion   . Subarachnoid hemorrhage     No coumadin or ASA-Dr.[facility].Marland Kitchenaneurysm..no treatment  . Hypertension   . Hyperlipidemia   . Personal history of venous thrombosis and embolism     No coumadin because of Subarachnoid hemorrhage  . Unspecified disorder resulting from impaired renal function   . Congestive heart failure, unspecified     EF 55-60%, echo, March, 2011  . GERD (gastroesophageal reflux disease)   . Aortic insufficiency     Mild, echo, March, 2011  . Mitral regurgitation     Mild, echo, March, 2011  . Aortic root dilatation     4.5 cm echo, November, 2010 / no  mention  . Dizziness   . Anemia   . Chronic kidney disease   . Pulmonary embolism     Also DVT but no Coumadin because of subarachnoid hemorrhage  . Atrial fibrillation     cannot take Coumadin /  Holter May, 2011.. mild bradycardia digoxin stopped / September, 2011, shortness of breath and palpitations.. digoxin resume /  March, 2012 can't find it digoxin on med list... to be restarted  . Shingles     November, 2011  . Ejection fraction     55-60%, echo, March, 2011  . Shortness of breath     Nuclear 07/11/2010,,73%, no ischemia  . Renal insufficiency   . Macular degeneration (senile) of retina, unspecified   . Myalgia and myositis, unspecified   . B12 deficiency anemia   . Pulmonary embolism   . Stroke     December, 2012, probably embolic from atrial fib, patient cannot be treated because of history of an aneurysm.  . Brain aneurysm 2007  . Thrombocytopenia     Dr. Myna Hidalgo    Past Surgical History  Procedure Date  . Foot surgery   . Cataract extraction   . Cholecystectomy   . Pulmonary embolism surgery     vena cava filter  . Orif hip fracture     07/2007  . Epidural block injection     11/2009  . Abdominal hysterectomy   . Appendectomy   . Total hip arthroplasty     ROS  Patient denies fever, chills, headache, sweats, rash, change in vision, change in hearing, , nausea or vomiting, urinary symptoms.All other systems are reviewed and are negative.  PHYSICAL EXAM  The patient is here today with a lady who is always present. Chest the patient is oriented to person time and place. Affect is normal. She really looks quite good. There is no jugular venous distention. Lungs are clear. Respiratory effort is nonlabored. Cardiac exam reveals S1 and S2. There are no clicks or significant murmurs. The abdomen is soft. There is no peripheral edema.  Filed Vitals:   11/12/11 1026  BP: 118/68  Pulse: 90  Height: 5\' 3"  (1.6 m)  Weight: 141 lb (63.957 kg)  SpO2: 92%     ASSESSMENT & PLAN

## 2011-11-12 NOTE — Patient Instructions (Addendum)
Your physician recommends that you continue on your current medications as directed. Please refer to the Current Medication list given to you today.    Your physician wants you to follow-up in: 6 MONTHS WITH DR KATZ You will receive a reminder letter in the mail two months in advance. If you don't receive a letter, please call our office to schedule the follow-up appointment.  

## 2011-11-12 NOTE — Assessment & Plan Note (Signed)
In the past I have adjusted her medicines for her atrial fib. At one point I thought that her rate might be to slow. I am wondering if it may be too fast at times now. However I'm hesitant to adjust her meds any further. She will be followed on her current meds.

## 2011-11-12 NOTE — Assessment & Plan Note (Signed)
The patient has chronic shortness of breath. Over time we have felt that it is not ischemic. She has normal systolic function. She's not volume overloaded. I always wonder whether it could be from increased atrial fib rate. However I feel she is on the appropriate medications.

## 2011-11-16 ENCOUNTER — Ambulatory Visit (INDEPENDENT_AMBULATORY_CARE_PROVIDER_SITE_OTHER): Payer: Medicare Other | Admitting: Family Medicine

## 2011-11-16 ENCOUNTER — Encounter: Payer: Self-pay | Admitting: Family Medicine

## 2011-11-16 VITALS — BP 116/60 | HR 89 | Temp 98.0°F | Wt 142.2 lb

## 2011-11-16 DIAGNOSIS — Z23 Encounter for immunization: Secondary | ICD-10-CM

## 2011-11-16 DIAGNOSIS — D696 Thrombocytopenia, unspecified: Secondary | ICD-10-CM

## 2011-11-16 DIAGNOSIS — E039 Hypothyroidism, unspecified: Secondary | ICD-10-CM

## 2011-11-16 DIAGNOSIS — R5383 Other fatigue: Secondary | ICD-10-CM

## 2011-11-16 DIAGNOSIS — R0602 Shortness of breath: Secondary | ICD-10-CM

## 2011-11-16 NOTE — Assessment & Plan Note (Signed)
See labs----b12 , tsh all normal bp controlled ? Deconditioning-- encouraged pt to exercise more like she did with PT

## 2011-11-16 NOTE — Assessment & Plan Note (Signed)
Pulse ox never dropped below 96% with walking around office

## 2011-11-16 NOTE — Assessment & Plan Note (Signed)
Controlled con't tsh

## 2011-11-16 NOTE — Patient Instructions (Addendum)

## 2011-11-16 NOTE — Progress Notes (Signed)
  Subjective:    Patient ID: [name], female    DOB: Mar 04, 1918, 78 y.o.   MRN: [id number]  HPI Pt here to discuss cardio ov.  Pt states she is still feeling very tired and gets sob easily.  Pt admits she felt better when she was getting PT but then is not as active at home at becomes tired.  Her friend is in the room as well and agrees with this as well.   Pt is seeing hematology for her platelets and was supposed to f/u in Nov.  Cardio informed pt her heart was not the problems.   A fast atrial rate per cards could be cause of her sob but her meds have everything under control.  Pt also admits to sleeping or laying down a lot during the day.   Review of Systems As above    Objective:   Physical Exam  Constitutional: She is oriented to person, place, and time. She appears well-developed and well-nourished.  Cardiovascular: Normal rate and regular rhythm.   Pulmonary/Chest: Effort normal and breath sounds normal.  Neurological: She is alert and oriented to person, place, and time.  Psychiatric: She has a normal mood and affect. Her behavior is normal. Judgment and thought content normal.          Assessment & Plan:

## 2011-11-16 NOTE — Assessment & Plan Note (Signed)
Per hematology 

## 2011-12-11 ENCOUNTER — Telehealth: Payer: Self-pay | Admitting: Hematology & Oncology

## 2011-12-11 NOTE — Telephone Encounter (Signed)
Pt called wanting [place] to draw labs that her cardiologist wants. I transferred call to RN voice mail. Pt aware RN will call her back.

## 2011-12-29 ENCOUNTER — Other Ambulatory Visit: Payer: Self-pay | Admitting: *Deleted

## 2011-12-29 MED ORDER — FUROSEMIDE 40 MG PO TABS: 40.0000 mg | ORAL_TABLET | Freq: Two times a day (BID) | ORAL | Status: DC

## 2012-01-01 ENCOUNTER — Other Ambulatory Visit: Payer: Self-pay | Admitting: Family Medicine

## 2012-01-01 NOTE — Telephone Encounter (Signed)
Rx sent.    MW 

## 2012-01-13 ENCOUNTER — Other Ambulatory Visit: Payer: Self-pay | Admitting: Family Medicine

## 2012-01-13 MED ORDER — ZOLPIDEM TARTRATE 5 MG PO TABS: 2.5000 mg | ORAL_TABLET | Freq: Every evening | ORAL | Status: DC | PRN

## 2012-01-13 NOTE — Telephone Encounter (Signed)
Refill Zolpidem tartrate 5mg , NO qty/instruction or last fill date listed --NOT ON CURRENT MEDS LIST--last ov 10.7.13 follow up

## 2012-01-13 NOTE — Telephone Encounter (Signed)
Ok for #30, no refills as per prescribing habits of PCP

## 2012-01-13 NOTE — Telephone Encounter (Signed)
Last seen 11/16/11 and filled 10/02/11 3 30. Please advise     KP

## 2012-01-19 ENCOUNTER — Telehealth: Payer: Self-pay | Admitting: *Deleted

## 2012-01-19 ENCOUNTER — Other Ambulatory Visit: Payer: Self-pay

## 2012-01-19 NOTE — Telephone Encounter (Signed)
Pt requesting refill on Ambien 5mg , (takes 1/2 tab po q hs). Note sent to [facility] for refill request

## 2012-01-19 NOTE — Telephone Encounter (Signed)
Message copied by Arnette Norris on Tue Jan 19, 2012  5:11 PM ------      Message from: [name]      Created: Tue Jan 19, 2012  9:16 AM       Pt called triage, needs refill on Ambien 5mg , takes 1/2 tab po at hs.            [name]

## 2012-01-19 NOTE — Telephone Encounter (Signed)
Rx was sent last week, i confirmed with Irving Burton at the pharmacy and made the patient aware she could go by to pick it up and she agreed to do so.     KP

## 2012-01-26 ENCOUNTER — Other Ambulatory Visit: Payer: Self-pay | Admitting: Cardiology

## 2012-01-26 MED ORDER — AMLODIPINE BESYLATE 5 MG PO TABS: 5.0000 mg | ORAL_TABLET | Freq: Every day | ORAL | Status: DC

## 2012-03-08 ENCOUNTER — Encounter: Payer: Self-pay | Admitting: Lab

## 2012-03-09 ENCOUNTER — Ambulatory Visit: Payer: Medicare Other | Admitting: Family Medicine

## 2012-03-25 ENCOUNTER — Other Ambulatory Visit: Payer: Self-pay | Admitting: Family Medicine

## 2012-04-08 ENCOUNTER — Other Ambulatory Visit: Payer: Self-pay

## 2012-04-20 ENCOUNTER — Other Ambulatory Visit: Payer: Self-pay

## 2012-04-20 MED ORDER — FUROSEMIDE 40 MG PO TABS: 40.0000 mg | ORAL_TABLET | Freq: Two times a day (BID) | ORAL | Status: DC

## 2012-04-26 ENCOUNTER — Encounter: Payer: Self-pay | Admitting: Lab

## 2012-04-27 ENCOUNTER — Ambulatory Visit (INDEPENDENT_AMBULATORY_CARE_PROVIDER_SITE_OTHER): Payer: Medicare Other | Admitting: Family Medicine

## 2012-04-27 ENCOUNTER — Encounter: Payer: Self-pay | Admitting: Family Medicine

## 2012-04-27 VITALS — BP 120/76 | HR 78 | Temp 98.1°F | Wt 140.0 lb

## 2012-04-27 DIAGNOSIS — R5381 Other malaise: Secondary | ICD-10-CM

## 2012-04-27 DIAGNOSIS — E785 Hyperlipidemia, unspecified: Secondary | ICD-10-CM

## 2012-04-27 DIAGNOSIS — R5383 Other fatigue: Secondary | ICD-10-CM

## 2012-04-27 DIAGNOSIS — I4891 Unspecified atrial fibrillation: Secondary | ICD-10-CM

## 2012-04-27 DIAGNOSIS — I10 Essential (primary) hypertension: Secondary | ICD-10-CM

## 2012-04-27 DIAGNOSIS — E039 Hypothyroidism, unspecified: Secondary | ICD-10-CM

## 2012-04-27 LAB — BASIC METABOLIC PANEL
BUN: 52 mg/dL — ABNORMAL HIGH (ref 6–23)
Chloride: 101 mEq/L (ref 96–112)
Creatinine, Ser: 1.9 mg/dL — ABNORMAL HIGH (ref 0.4–1.2)
GFR: 25.66 mL/min — ABNORMAL LOW (ref >60.00)
Glucose, Bld: 92 mg/dL (ref 70–99)

## 2012-04-27 LAB — LIPID PANEL
Cholesterol: 159 mg/dL (ref 0–200)
HDL: 56.3 mg/dL (ref >39.00)
LDL Cholesterol: 80 mg/dL (ref 0–99)
Total CHOL/HDL Ratio: 3
Triglycerides: 112 mg/dL (ref 0.0–149.0)
VLDL: 22.4 mg/dL (ref 0.0–40.0)

## 2012-04-27 LAB — CBC WITH DIFFERENTIAL/PLATELET
Basophils Absolute: 0 10*3/uL (ref 0.0–0.1)
Eosinophils Absolute: 0.1 10*3/uL (ref 0.0–0.7)
Eosinophils Relative: 1.6 % (ref 0.0–5.0)
HCT: 42.9 % (ref 36.0–46.0)
Lymphs Abs: 1.9 10*3/uL (ref 0.7–4.0)
MCV: 98.1 fl (ref 78.0–100.0)
Monocytes Absolute: 0.3 10*3/uL (ref 0.1–1.0)
Neutrophils Relative %: 58.4 % (ref 43.0–77.0)
Platelets: 125 10*3/uL — ABNORMAL LOW (ref 150.0–400.0)
RDW: 13.6 % (ref 11.5–14.6)
WBC: 5.7 10*3/uL (ref 4.5–10.5)

## 2012-04-27 LAB — POCT URINALYSIS DIPSTICK
Leukocytes, UA: NEGATIVE
Nitrite, UA: NEGATIVE
Urobilinogen, UA: 0.2
pH, UA: 6

## 2012-04-27 LAB — TSH: TSH: 0.3 u[IU]/mL — ABNORMAL LOW (ref 0.35–5.50)

## 2012-04-27 LAB — HEPATIC FUNCTION PANEL
ALT: 17 U/L (ref 0–35)
Bilirubin, Direct: 0.1 mg/dL (ref 0.0–0.3)
Total Bilirubin: 0.9 mg/dL (ref 0.3–1.2)

## 2012-04-27 NOTE — Assessment & Plan Note (Signed)
Check labs 

## 2012-04-27 NOTE — Progress Notes (Signed)
  Subjective:    Patient ID: [name], female    DOB: 05/02/1918, 79 y.o.   MRN: [id number]  HPI Pt here c/o weakness, fatigue, and is requesting repeat labs.  Pt was having inc swelling but cardiology inc her diuretic and she states it has been much better.  Pt is having inc sob.  No chest pain.      Review of Systems As above    Objective:   Physical Exam BP 120/76  Pulse 78  Temp(Src) 98.1 F (36.7 C) (Oral)  Wt 140 lb (63.504 kg)  BMI 24.81 kg/m2  SpO2 96% General appearance: alert, cooperative, appears stated age and no distress Nose: Nares normal. Septum midline. Mucosa normal. No drainage or sinus tenderness. Throat: lips, mucosa, and tongue normal; teeth and gums normal Neck: no adenopathy, supple, symmetrical, trachea midline and thyroid not enlarged, symmetric, no tenderness/mass/nodules Lungs: clear to auscultation bilaterally Heart: S1, S2 normal Extremities: edema trace pitting b/l       Assessment & Plan:

## 2012-04-27 NOTE — Assessment & Plan Note (Signed)
Check labs today.

## 2012-04-27 NOTE — Assessment & Plan Note (Signed)
Home readings running low but good in office today Pt to bring in bp cuff so we can check and make sure it is working properly

## 2012-04-27 NOTE — Assessment & Plan Note (Signed)
Pt will bring in her bp cuff so we can compare with ours  meds may need to be adjusted

## 2012-04-27 NOTE — Patient Instructions (Addendum)

## 2012-04-27 NOTE — Assessment & Plan Note (Signed)
Per cardiology 

## 2012-05-02 ENCOUNTER — Other Ambulatory Visit: Payer: Self-pay | Admitting: *Deleted

## 2012-05-02 DIAGNOSIS — E0590 Thyrotoxicosis, unspecified without thyrotoxic crisis or storm: Secondary | ICD-10-CM

## 2012-05-02 MED ORDER — LEVOTHYROXINE SODIUM 50 MCG PO TABS: 50.0000 ug | ORAL_TABLET | Freq: Every day | ORAL | Status: DC

## 2012-05-02 NOTE — Telephone Encounter (Signed)
Refill for Synthroid sent to [facility] on [facility]

## 2012-05-12 ENCOUNTER — Encounter: Payer: Self-pay | Admitting: Cardiology

## 2012-05-12 ENCOUNTER — Ambulatory Visit (INDEPENDENT_AMBULATORY_CARE_PROVIDER_SITE_OTHER): Payer: Medicare Other | Admitting: Cardiology

## 2012-05-12 VITALS — BP 130/78 | HR 77 | Ht 63.0 in | Wt 142.1 lb

## 2012-05-12 DIAGNOSIS — I4891 Unspecified atrial fibrillation: Secondary | ICD-10-CM

## 2012-05-12 DIAGNOSIS — R0602 Shortness of breath: Secondary | ICD-10-CM

## 2012-05-12 NOTE — Assessment & Plan Note (Signed)
Her shortness of breath is chronic. She does not have heart failure at this time. No further workup.

## 2012-05-12 NOTE — Progress Notes (Signed)
HPI  Patient is seen today to followup atrial fibrillation and shortness of breath. I saw her last October, 2013. When I saw her at that time she had recovered nicely from the CVA that was probably embolic. Unfortunately she cannot be anticoagulated. She had a subarachnoid hemorrhage in the past. She's been doing relatively well. She's not having any chest pain. There is no syncope or presyncope. She does have shortness of breath.  Allergies  Allergen Reactions  . Contrast Media (Iodinated Diagnostic Agents) Other (See Comments)    No Contrast due to cerebral aneurysm per patient  . Ciprofloxacin     REACTION: itching , diarrhea  . Hydrocodone-Acetaminophen     REACTION: hallucination    Current Outpatient Prescriptions  Medication Sig Dispense Refill  . amLODipine (NORVASC) 5 MG tablet Take 1 tablet (5 mg total) by mouth daily.  30 tablet  10  . B Complex-C (B-COMPLEX WITH VITAMIN C) tablet Take 1 tablet by mouth daily.        . Calcium Carbonate-Vit D-Min 600-400 MG-UNIT TABS Take 1 tablet by mouth daily.        . cholecalciferol (VITAMIN D) 1000 UNITS tablet Take 1,000 Units by mouth daily.      Marland Kitchen diltiazem (CARDIZEM CD) 240 MG 24 hr capsule TAKE 1 CAPSULE (240 MG TOTAL) BY MOUTH DAILY.  30 capsule  8  . Docusate Calcium (STOOL SOFTENER PO) Take 1 tablet by mouth daily.      . ferrous sulfate 325 (65 FE) MG tablet Take 325 mg by mouth daily with breakfast.      . furosemide (LASIX) 40 MG tablet Take 1 tablet (40 mg total) by mouth 2 (two) times daily.  60 tablet  2  . Glucosamine-Chondroitin-Vit C 2000-1200-60 MG/30ML LIQD Take 1 tablet by mouth daily.      Marland Kitchen lactase (LACTAID) 3000 UNITS tablet Take 1 tablet by mouth daily.        Marland Kitchen levothyroxine (SYNTHROID, LEVOTHROID) 50 MCG tablet Take 1 tablet (50 mcg total) by mouth daily.  30 tablet  2  . Melatonin 5 MG TABS Take 2 tablets by mouth at bedtime.      . metoprolol succinate (TOPROL-XL) 25 MG 24 hr tablet TAKE ONE TABLET BY MOUTH  DAILY  30 tablet  8  . Misc Natural Products (OSTEO BI-FLEX ADV TRIPLE ST) TABS Take 1 tablet by mouth daily.       . Multiple Minerals-Vitamins (CALCIUM-MAGNESIUM-ZINC) TABS Take 1 tablet by mouth daily.      . Multiple Vitamin (MULTIVITAMIN) tablet Take 1 tablet by mouth daily.        . NON FORMULARY Take 1 tablet by mouth daily. occuvite adult 50+      . pravastatin (PRAVACHOL) 20 MG tablet TAKE 1 TABLET (20 MG TOTAL) BY MOUTH DAILY.  30 tablet  2  . Probiotic Product (ALIGN) 4 MG CAPS Take 1 capsule by mouth daily.        Marland Kitchen Propylene Glycol (SYSTANE BALANCE OP) Place 1 drop into both eyes 2 (two) times daily.        Marland Kitchen zolpidem (AMBIEN) 5 MG tablet Take 0.5 tablets (2.5 mg total) by mouth at bedtime as needed.  30 tablet  0   No current facility-administered medications for this visit.    History   Social History  . Marital Status: Divorced    Spouse Name: N/A    Number of Children: N/A  . Years of Education: N/A   Occupational  History  . Retired    Social History Main Topics  . Smoking status: Never Smoker   . Smokeless tobacco: Never Used  . Alcohol Use: No  . Drug Use: No  . Sexually Active: Not on file   Other Topics Concern  . Not on file   Social History Narrative  . No narrative on file    Family History  Problem Relation Age of Onset  . Aortic aneurysm Brother   . Breast cancer Sister     1st degree relative <50  . Coronary artery disease Mother     1st degree relative <60  . Stroke Father     Female- 1st degree relative <50    Past Medical History  Diagnosis Date  . Osteopenia   . History of cervical cancer   . Benign neoplasm of adrenal gland   . Hypothyroidism   . COPD (chronic obstructive pulmonary disease)   . Esophageal reflux   . Osteoarthrosis, unspecified whether generalized or localized, unspecified site   . Unspecified pleural effusion   . Subarachnoid hemorrhage     No coumadin or ASA-Dr.Poole Neurosurgery.Marland Kitchenaneurysm..no treatment  .  Hypertension   . Hyperlipidemia   . Personal history of venous thrombosis and embolism     No coumadin because of Subarachnoid hemorrhage  . Unspecified disorder resulting from impaired renal function   . Congestive heart failure, unspecified     EF 55-60%, echo, March, 2011  . GERD (gastroesophageal reflux disease)   . Aortic insufficiency     Mild, echo, March, 2011  . Mitral regurgitation     Mild, echo, March, 2011  . Aortic root dilatation     4.5 cm echo, November, 2010 / no mention  . Dizziness   . Anemia   . Chronic kidney disease   . Pulmonary embolism     Also DVT but no Coumadin because of subarachnoid hemorrhage  . Atrial fibrillation     cannot take Coumadin /  Holter May, 2011.. mild bradycardia digoxin stopped / September, 2011, shortness of breath and palpitations.. digoxin resume /  March, 2012 can't find it digoxin on med list... to be restarted  . Shingles     November, 2011  . Ejection fraction     55-60%, echo, March, 2011  . Shortness of breath     Nuclear 07/11/2010,,73%, no ischemia  . Renal insufficiency   . Macular degeneration (senile) of retina, unspecified   . Myalgia and myositis, unspecified   . B12 deficiency anemia   . Pulmonary embolism   . Stroke     December, 2012, probably embolic from atrial fib, patient cannot be treated because of history of an aneurysm.  . Brain aneurysm 2007  . Thrombocytopenia     Dr. Myna Hidalgo    Past Surgical History  Procedure Laterality Date  . Foot surgery    . Cataract extraction    . Cholecystectomy    . Pulmonary embolism surgery      vena cava filter  . Orif hip fracture      07/2007  . Epidural block injection      11/2009  . Abdominal hysterectomy    . Appendectomy    . Total hip arthroplasty      Patient Active Problem List  Diagnosis  . SHINGLES  . TINEA CORPORIS  . BENIGN NEOPLASM OF ADRENAL GLAND  . HYPOTHYROIDISM  . HYPERLIPIDEMIA  . ANEMIA, B12 DEFICIENCY  . UNSPECIFIED  THROMBOCYTOPENIA  . MACULAR DEGENERATION, EARLY  .  MITRAL REGURGITATION  . HYPERTENSION  . AORTIC INSUFFICIENCY  . ATRIAL FIBRILLATION WITH RAPID VENTRICULAR RESPONSE  . Subarachnoid hemorrhage  . ANEURYSM, THORACIC AORTIC  . COPD  . GERD  . DIVERTICULITIS, ACUTE  . RENAL INSUFFICIENCY  . RENAL CYST  . BREAST CYST, LEFT  . OSTEOARTHRITIS  . PAIN IN THORACIC SPINE  . BACK PAIN  . MYALGIA  . OSTEOPENIA  . DIZZINESS  . EDEMA  . HEADACHE  . SHORTNESS OF BREATH  . DYSPNEA/SHORTNESS OF BREATH  . CHEST PAIN  . URINARY INCONTINENCE  . Nonspecific (abnormal) findings on radiological and other examination of body structure  . HIP FRACTURE, RIGHT  . CERVICAL CANCER, HX OF  . Personal History of Venous Thrombosis and Embolism  . FOOT SURGERY, HX OF  . CT, CHEST, ABNORMAL  . BRUISE  . Hypothyroidism  . COPD (chronic obstructive pulmonary disease)  . Unspecified pleural effusion  . Subarachnoid hemorrhage  . Hypertension  . Hyperlipidemia  . GERD (gastroesophageal reflux disease)  . Aortic insufficiency  . Mitral regurgitation  . Aortic root dilatation  . Dizziness  . Anemia  . Chronic kidney disease  . Pulmonary embolism  . Atrial fibrillation  . Shingles  . Atrial fibrillation  . Congestive heart failure, unspecified  . Ejection fraction  . Shortness of breath  . Chronic diarrhea  . CVA (cerebral infarction)  . Hemiplegia affecting dominant side  . CKD (chronic kidney disease) stage 3, GFR 30-59 ml/min  . Stroke  . Brain aneurysm  . Depression  . Excessive gas  . Fatigue  . Thrombocytopenia    ROS   Patient denies fever, chills, headache, sweats, rash, change in vision, change in hearing, chest pain, cough, nausea vomiting, urinary symptoms. All other systems are reviewed and are negative.  PHYSICAL EXAM  Patient is walking with a cane. She is oriented to person time and place. Affect is normal. She is here with her helper. There is no jugular venous  distention. Lungs reveal decreased breath sounds. There is no respiratory distress. Cardiac exam reveals S1 and S2. The abdomen is soft. Is no peripheral edema.  Filed Vitals:   05/12/12 1544  BP: 130/78  Pulse: 77  Height: 5\' 3"  (1.6 m)  Weight: 142 lb 1.9 oz (64.465 kg)   EKG is done today and reviewed by me. She has atrial fibrillation. The rate is controlled. There is no significant change.  ASSESSMENT & PLAN

## 2012-05-12 NOTE — Assessment & Plan Note (Signed)
Atrial fibrillation rate is controlled. No change in therapy. 

## 2012-05-12 NOTE — Patient Instructions (Addendum)
Your physician wants you to follow-up in:  6 months. You will receive a reminder letter in the mail two months in advance. If you don't receive a letter, please call our office to schedule the follow-up appointment.   

## 2012-05-19 ENCOUNTER — Encounter: Payer: Self-pay | Admitting: Family Medicine

## 2012-06-13 ENCOUNTER — Ambulatory Visit (INDEPENDENT_AMBULATORY_CARE_PROVIDER_SITE_OTHER): Payer: Medicare Other | Admitting: Emergency Medicine

## 2012-06-13 VITALS — BP 115/69 | HR 102 | Temp 97.8°F | Resp 16 | Ht 63.0 in | Wt 141.0 lb

## 2012-06-13 DIAGNOSIS — Z23 Encounter for immunization: Secondary | ICD-10-CM

## 2012-06-13 DIAGNOSIS — S81812A Laceration without foreign body, left lower leg, initial encounter: Secondary | ICD-10-CM

## 2012-06-13 DIAGNOSIS — S81809A Unspecified open wound, unspecified lower leg, initial encounter: Secondary | ICD-10-CM

## 2012-06-13 NOTE — Progress Notes (Signed)
[facility] [address] [place] [address] [phone]  Date:  06/13/2012   Name:  [name]   DOB:  Apr 03, 1918   MRN:  [id number]  PCP:  Loreen Freud, DO    Chief Complaint: Fall   History of Present Illness:  MIIA BLANKS is a 79 y.o. very pleasant female patient who presents with the following:  Injured her left lower leg when she fell.  Uncertain last TD.  Tripped rather than had syncopal episode.  No neuro or visual symptoms.  No chest pain or shortness of breath.  Sequelae of stroke with spastic paralysis right hand.  No improvement with over the counter medications or other home remedies. Denies other complaint or health concern today.   Patient Active Problem List   Diagnosis Date Noted  . Thrombocytopenia   . Fatigue 07/13/2011  . Depression 06/10/2011  . Excessive gas 06/10/2011  . Stroke   . Brain aneurysm   . CKD (chronic kidney disease) stage 3, GFR 30-59 ml/min 02/11/2011  . CVA (cerebral infarction) 02/09/2011    Class: Acute  . Hemiplegia affecting dominant side 02/09/2011    Class: Acute  . Chronic diarrhea 09/17/2010  . Shortness of breath   . Congestive heart failure, unspecified   . Ejection fraction   . Atrial fibrillation   . Hypothyroidism   . COPD (chronic obstructive pulmonary disease)   . Unspecified pleural effusion   . Subarachnoid hemorrhage   . Hypertension   . Hyperlipidemia   . GERD (gastroesophageal reflux disease)   . Aortic insufficiency   . Mitral regurgitation   . Aortic root dilatation   . Dizziness   . Anemia   . Chronic kidney disease   . Pulmonary embolism   . Atrial fibrillation   . Shingles   . BRUISE 03/19/2010  . TINEA CORPORIS 12/24/2009  . RENAL INSUFFICIENCY 08/20/2009  . GERD 06/06/2009  . EDEMA 05/03/2009  . ATRIAL FIBRILLATION WITH RAPID VENTRICULAR RESPONSE 04/10/2009  . DIVERTICULITIS, ACUTE 03/18/2009  . RENAL CYST 03/18/2009  . DYSPNEA/SHORTNESS OF BREATH  02/13/2009  . MACULAR DEGENERATION, EARLY 01/14/2009  . SHORTNESS OF BREATH 12/03/2008  . MITRAL REGURGITATION 12/02/2008  . AORTIC INSUFFICIENCY 12/02/2008  . UNSPECIFIED THROMBOCYTOPENIA 10/26/2008  . MYALGIA 05/24/2008  . SHINGLES 02/14/2008  . BACK PAIN 01/02/2008  . ANEURYSM, THORACIC AORTIC 09/15/2007  . Nonspecific (abnormal) findings on radiological and other examination of body structure 09/15/2007  . CT, CHEST, ABNORMAL 09/15/2007  . HEADACHE 09/09/2007  . Subarachnoid hemorrhage 07/31/2007  . HIP FRACTURE, RIGHT 07/31/2007  . PAIN IN THORACIC SPINE 04/29/2007  . OSTEOPENIA 02/28/2007  . BENIGN NEOPLASM OF ADRENAL GLAND 01/25/2007  . HYPOTHYROIDISM 01/25/2007  . COPD 01/25/2007  . OSTEOARTHRITIS 01/25/2007  . URINARY INCONTINENCE 01/25/2007  . CERVICAL CANCER, HX OF 01/25/2007  . CHEST PAIN 01/12/2007  . ANEMIA, B12 DEFICIENCY 08/11/2006  . BREAST CYST, LEFT 08/11/2006  . FOOT SURGERY, HX OF 08/11/2006  . DIZZINESS 07/21/2006  . HYPERLIPIDEMIA 02/15/2006  . HYPERTENSION 02/15/2006  . Personal History of Venous Thrombosis and Embolism 02/15/2006    Past Medical History  Diagnosis Date  . Osteopenia   . History of cervical cancer   . Benign neoplasm of adrenal gland   . Hypothyroidism   . COPD (chronic obstructive pulmonary disease)   . Esophageal reflux   . Osteoarthrosis, unspecified whether generalized or localized, unspecified site   . Unspecified pleural effusion   . Subarachnoid hemorrhage  No coumadin or ASA-Dr.Poole Neurosurgery.Marland Kitchenaneurysm..no treatment  . Hypertension   . Hyperlipidemia   . Personal history of venous thrombosis and embolism     No coumadin because of Subarachnoid hemorrhage  . Unspecified disorder resulting from impaired renal function   . Congestive heart failure, unspecified     EF 55-60%, echo, March, 2011  . GERD (gastroesophageal reflux disease)   . Aortic insufficiency     Mild, echo, March, 2011  . Mitral regurgitation      Mild, echo, March, 2011  . Aortic root dilatation     4.5 cm echo, November, 2010 / no mention  . Dizziness   . Anemia   . Chronic kidney disease   . Pulmonary embolism     Also DVT but no Coumadin because of subarachnoid hemorrhage  . Atrial fibrillation     cannot take Coumadin /  Holter May, 2011.. mild bradycardia digoxin stopped / September, 2011, shortness of breath and palpitations.. digoxin resume /  March, 2012 can't find it digoxin on med list... to be restarted  . Shingles     November, 2011  . Ejection fraction     55-60%, echo, March, 2011  . Shortness of breath     Nuclear 07/11/2010,,73%, no ischemia  . Renal insufficiency   . Macular degeneration (senile) of retina, unspecified   . Myalgia and myositis, unspecified   . B12 deficiency anemia   . Pulmonary embolism   . Stroke     December, 2012, probably embolic from atrial fib, patient cannot be treated because of history of an aneurysm.  . Brain aneurysm 2007  . Thrombocytopenia     Dr. Myna Hidalgo    Past Surgical History  Procedure Laterality Date  . Foot surgery    . Cataract extraction    . Cholecystectomy    . Pulmonary embolism surgery      vena cava filter  . Orif hip fracture      07/2007  . Epidural block injection      11/2009  . Abdominal hysterectomy    . Appendectomy    . Total hip arthroplasty      History  Substance Use Topics  . Smoking status: Never Smoker   . Smokeless tobacco: Never Used  . Alcohol Use: No    Family History  Problem Relation Age of Onset  . Aortic aneurysm Brother   . Breast cancer Sister     1st degree relative <50  . Coronary artery disease Mother     1st degree relative <60  . Stroke Father     Female- 1st degree relative <50    Allergies  Allergen Reactions  . Contrast Media (Iodinated Diagnostic Agents) Other (See Comments)    No Contrast due to cerebral aneurysm per patient  . Ciprofloxacin     REACTION: itching , diarrhea  .  Hydrocodone-Acetaminophen     REACTION: hallucination    Medication list has been reviewed and updated.  Current Outpatient Prescriptions on File Prior to Visit  Medication Sig Dispense Refill  . amLODipine (NORVASC) 5 MG tablet Take 1 tablet (5 mg total) by mouth daily.  30 tablet  10  . B Complex-C (B-COMPLEX WITH VITAMIN C) tablet Take 1 tablet by mouth daily.        . Calcium Carbonate-Vit D-Min 600-400 MG-UNIT TABS Take 1 tablet by mouth daily.        . cholecalciferol (VITAMIN D) 1000 UNITS tablet Take 1,000 Units by mouth daily.      Marland Kitchen  diltiazem (CARDIZEM CD) 240 MG 24 hr capsule TAKE 1 CAPSULE (240 MG TOTAL) BY MOUTH DAILY.  30 capsule  8  . Docusate Calcium (STOOL SOFTENER PO) Take 1 tablet by mouth daily.      . ferrous sulfate 325 (65 FE) MG tablet Take 325 mg by mouth daily with breakfast.      . furosemide (LASIX) 40 MG tablet Take 1 tablet (40 mg total) by mouth 2 (two) times daily.  60 tablet  2  . Glucosamine-Chondroitin-Vit C 2000-1200-60 MG/30ML LIQD Take 1 tablet by mouth daily.      Marland Kitchen lactase (LACTAID) 3000 UNITS tablet Take 1 tablet by mouth daily.        Marland Kitchen levothyroxine (SYNTHROID, LEVOTHROID) 50 MCG tablet Take 1 tablet (50 mcg total) by mouth daily.  30 tablet  2  . Melatonin 5 MG TABS Take 2 tablets by mouth at bedtime.      . metoprolol succinate (TOPROL-XL) 25 MG 24 hr tablet TAKE ONE TABLET BY MOUTH DAILY  30 tablet  8  . Misc Natural Products (OSTEO BI-FLEX ADV TRIPLE ST) TABS Take 1 tablet by mouth daily.       . Multiple Vitamin (MULTIVITAMIN) tablet Take 1 tablet by mouth daily.        . NON FORMULARY Take 1 tablet by mouth daily. occuvite adult 50+      . pravastatin (PRAVACHOL) 20 MG tablet TAKE 1 TABLET (20 MG TOTAL) BY MOUTH DAILY.  30 tablet  2  . Probiotic Product (ALIGN) 4 MG CAPS Take 1 capsule by mouth daily.        Marland Kitchen Propylene Glycol (SYSTANE BALANCE OP) Place 1 drop into both eyes 2 (two) times daily.        Marland Kitchen zolpidem (AMBIEN) 5 MG tablet Take  0.5 tablets (2.5 mg total) by mouth at bedtime as needed.  30 tablet  0  . Multiple Minerals-Vitamins (CALCIUM-MAGNESIUM-ZINC) TABS Take 1 tablet by mouth daily.       No current facility-administered medications on file prior to visit.    Review of Systems:  As per HPI, otherwise negative.    Physical Examination: Filed Vitals:   06/13/12 1526  BP: 115/69  Pulse: 102  Temp: 97.8 F (36.6 C)  Resp: 16   Filed Vitals:   06/13/12 1526  Height: 5\' 3"  (1.6 m)  Weight: 141 lb (63.957 kg)   Body mass index is 24.98 kg/(m^2). Ideal Body Weight: Weight in (lb) to have BMI = 25: 140.8   GEN: WDWN, NAD, Non-toxic, Alert & Oriented x 3 HEENT: Atraumatic, Normocephalic.  Ears and Nose: No external deformity. EXTR: No clubbing/cyanosis/edema NEURO: Normal gait.  PSYCH: Normally interactive. Conversant. Not depressed or anxious appearing.  Calm demeanor.  Skin tear on left mid shin.  NATI.  No fb.  Assessment and Plan: Skin tear TD steristips and benzoin  Signed,  Phillips Odor, MD

## 2012-06-13 NOTE — Patient Instructions (Addendum)
Laceration Laceration Care, Adult A laceration is a cut or lesion that goes through all layers of the skin and into the tissue just beneath the skin. TREATMENT  Some lacerations may not require closure. Some lacerations may not be able to be closed due to an increased risk of infection. It is important to see your caregiver as soon as possible after an injury to minimize the risk of infection and maximize the opportunity for successful closure. If closure is appropriate, pain medicines may be given, if needed. The wound will be cleaned to help prevent infection. Your caregiver will use stitches (sutures), staples, wound glue (adhesive), or skin adhesive strips to repair the laceration. These tools bring the skin edges together to allow for faster healing and a better cosmetic outcome. However, all wounds will heal with a scar. Once the wound has healed, scarring can be minimized by covering the wound with sunscreen during the day for 1 full year. HOME CARE INSTRUCTIONS  For sutures or staples:  Keep the wound clean and dry.  If you were given a bandage (dressing), you should change it at least once a day. Also, change the dressing if it becomes wet or dirty, or as directed by your caregiver.  Wash the wound with soap and water 2 times a day. Rinse the wound off with water to remove all soap. Pat the wound dry with a clean towel.  After cleaning, apply a thin layer of the antibiotic ointment as recommended by your caregiver. This will help prevent infection and keep the dressing from sticking.  You may shower as usual after the first 24 hours. Do not soak the wound in water until the sutures are removed.  Only take over-the-counter or prescription medicines for pain, discomfort, or fever as directed by your caregiver.  Get your sutures or staples removed as directed by your caregiver. For skin adhesive strips:  Keep the wound clean and dry.  Do not get the skin adhesive strips wet. You may  bathe carefully, using caution to keep the wound dry.  If the wound gets wet, pat it dry with a clean towel.  Skin adhesive strips will fall off on their own. You may trim the strips as the wound heals. Do not remove skin adhesive strips that are still stuck to the wound. They will fall off in time. For wound adhesive:  You may briefly wet your wound in the shower or bath. Do not soak or scrub the wound. Do not swim. Avoid periods of heavy perspiration until the skin adhesive has fallen off on its own. After showering or bathing, gently pat the wound dry with a clean towel.  Do not apply liquid medicine, cream medicine, or ointment medicine to your wound while the skin adhesive is in place. This may loosen the film before your wound is healed.  If a dressing is placed over the wound, be careful not to apply tape directly over the skin adhesive. This may cause the adhesive to be pulled off before the wound is healed.  Avoid prolonged exposure to sunlight or tanning lamps while the skin adhesive is in place. Exposure to ultraviolet light in the first year will darken the scar.  The skin adhesive will usually remain in place for 5 to 10 days, then naturally fall off the skin. Do not pick at the adhesive film. You may need a tetanus shot if:  You cannot remember when you had your last tetanus shot.  You have never had a  tetanus shot. If you get a tetanus shot, your arm may swell, get red, and feel warm to the touch. This is common and not a problem. If you need a tetanus shot and you choose not to have one, there is a rare chance of getting tetanus. Sickness from tetanus can be serious. SEEK MEDICAL CARE IF:   You have redness, swelling, or increasing pain in the wound.  You see a red line that goes away from the wound.  You have yellowish-white fluid (pus) coming from the wound.  You have a fever.  You notice a bad smell coming from the wound or dressing.  Your wound breaks open before  or after sutures have been removed.  You notice something coming out of the wound such as wood or glass.  Your wound is on your hand or foot and you cannot move a finger or toe. SEEK IMMEDIATE MEDICAL CARE IF:   Your pain is not controlled with prescribed medicine.  You have severe swelling around the wound causing pain and numbness or a change in color in your arm, hand, leg, or foot.  Your wound splits open and starts bleeding.  You have worsening numbness, weakness, or loss of function of any joint around or beyond the wound.  You develop painful lumps near the wound or on the skin anywhere on your body. MAKE SURE YOU:   Understand these instructions.  Will watch your condition.  Will get help right away if you are not doing well or get worse. Document Released: 01/26/2005 Document Revised: 04/20/2011 Document Reviewed: 07/22/2010 [facility] Patient Information 2013 [place], [place].

## 2012-06-23 ENCOUNTER — Other Ambulatory Visit: Payer: Self-pay

## 2012-06-23 MED ORDER — METOPROLOL SUCCINATE ER 25 MG PO TB24: 25.0000 mg | ORAL_TABLET | Freq: Every day | ORAL | Status: DC

## 2012-06-23 MED ORDER — DILTIAZEM HCL ER COATED BEADS 240 MG PO CP24: 240.0000 mg | ORAL_CAPSULE | Freq: Every day | ORAL | Status: DC

## 2012-07-06 ENCOUNTER — Ambulatory Visit (INDEPENDENT_AMBULATORY_CARE_PROVIDER_SITE_OTHER): Payer: Medicare Other | Admitting: Family Medicine

## 2012-07-06 ENCOUNTER — Ambulatory Visit: Payer: Medicare Other

## 2012-07-06 ENCOUNTER — Telehealth: Payer: Self-pay | Admitting: *Deleted

## 2012-07-06 ENCOUNTER — Ambulatory Visit (HOSPITAL_COMMUNITY)
Admission: RE | Admit: 2012-07-06 | Discharge: 2012-07-06 | Disposition: A | Discharge status: Home / Self Care | Payer: Medicare Other | Source: Ambulatory Visit | Attending: Family Medicine | Admitting: Family Medicine

## 2012-07-06 VITALS — BP 124/74 | HR 85 | Temp 98.5°F | Resp 17 | Ht 63.0 in | Wt 142.0 lb

## 2012-07-06 DIAGNOSIS — M79609 Pain in unspecified limb: Secondary | ICD-10-CM

## 2012-07-06 DIAGNOSIS — D7589 Other specified diseases of blood and blood-forming organs: Secondary | ICD-10-CM

## 2012-07-06 DIAGNOSIS — L03119 Cellulitis of unspecified part of limb: Secondary | ICD-10-CM

## 2012-07-06 DIAGNOSIS — M79662 Pain in left lower leg: Secondary | ICD-10-CM

## 2012-07-06 DIAGNOSIS — L089 Local infection of the skin and subcutaneous tissue, unspecified: Secondary | ICD-10-CM | POA: Insufficient documentation

## 2012-07-06 DIAGNOSIS — Z86718 Personal history of other venous thrombosis and embolism: Secondary | ICD-10-CM | POA: Insufficient documentation

## 2012-07-06 DIAGNOSIS — L03116 Cellulitis of left lower limb: Secondary | ICD-10-CM

## 2012-07-06 LAB — POCT CBC
Granulocyte percent: 52.7 %G (ref 37–80)
HCT, POC: 47.7 % (ref 37.7–47.9)
Hemoglobin: 14.8 g/dL (ref 12.2–16.2)
MCV: 103.5 fL — AB (ref 80–97)
POC Granulocyte: 3.6 (ref 2–6.9)
RBC: 4.61 M/uL (ref 4.04–5.48)
RDW, POC: 14.1 %

## 2012-07-06 MED ORDER — DOXYCYCLINE HYCLATE 100 MG PO TABS: 100.0000 mg | ORAL_TABLET | Freq: Two times a day (BID) | ORAL | Status: DC

## 2012-07-06 NOTE — Progress Notes (Signed)
*  PRELIMINARY RESULTS* Vascular Ultrasound Left lower extremity venous duplex has been completed.  Preliminary findings: Left = Negative for deep and superficial thrombosis.  Called report to [facility].  Farrel Demark, RDMS, RVT  EUNICE, Juanell Saffo 07/06/2012, 3:27 PM

## 2012-07-06 NOTE — Progress Notes (Signed)
[facility] [address], [place] [address] [phone]  Date:  07/06/2012   Name:  [name]   DOB:  Jan 14, 1919   MRN:  [id number]  PCP:  Loreen Freud, DO    Chief Complaint: Leg Injury   History of Present Illness:  [name] is a 79 y.o. very pleasant female patient who presents with the following:  She was here on 5/5 to evaluate a left leg injury. She was noted to have a skin tear on her left mid shin which was treated with benzoin and steristrips.  Her tetanus shot was updated as well at that visit.  She notes that the wound has bled off an on since her injury- in fact, it bled last night.   She fell and cut her left leg again superiore to the original wound about 5 days ago.  She tripped (no LOC) and hit her shin on a flower pot.   She whole lower leg has "been hurting all along but it really hurt last night."  Her ankle is slightly swollen There is no particular painful place- she hurts through her entire calf.   She has a history of A fib with rate control- cannot take anticoagulation due to a subarachnoid hemorrhage in the past.  She has had a DVT before.    Otherwise she has felt ok- no fever.       Patient Active Problem List   Diagnosis Date Noted  . Thrombocytopenia   . Fatigue 07/13/2011  . Depression 06/10/2011  . Excessive gas 06/10/2011  . Stroke   . Brain aneurysm   . CKD (chronic kidney disease) stage 3, GFR 30-59 ml/min 02/11/2011  . CVA (cerebral infarction) 02/09/2011    Class: Acute  . Hemiplegia affecting dominant side 02/09/2011    Class: Acute  . Chronic diarrhea 09/17/2010  . Shortness of breath   . Congestive heart failure, unspecified   . Ejection fraction   . Atrial fibrillation   . Hypothyroidism   . COPD (chronic obstructive pulmonary disease)   . Unspecified pleural effusion   . Subarachnoid hemorrhage   . Hypertension   . Hyperlipidemia   . GERD (gastroesophageal reflux disease)   . Aortic  insufficiency   . Mitral regurgitation   . Aortic root dilatation   . Dizziness   . Anemia   . Chronic kidney disease   . Pulmonary embolism   . Atrial fibrillation   . Shingles   . BRUISE 03/19/2010  . TINEA CORPORIS 12/24/2009  . RENAL INSUFFICIENCY 08/20/2009  . GERD 06/06/2009  . EDEMA 05/03/2009  . ATRIAL FIBRILLATION WITH RAPID VENTRICULAR RESPONSE 04/10/2009  . DIVERTICULITIS, ACUTE 03/18/2009  . RENAL CYST 03/18/2009  . DYSPNEA/SHORTNESS OF BREATH 02/13/2009  . MACULAR DEGENERATION, EARLY 01/14/2009  . SHORTNESS OF BREATH 12/03/2008  . MITRAL REGURGITATION 12/02/2008  . AORTIC INSUFFICIENCY 12/02/2008  . UNSPECIFIED THROMBOCYTOPENIA 10/26/2008  . MYALGIA 05/24/2008  . SHINGLES 02/14/2008  . BACK PAIN 01/02/2008  . ANEURYSM, THORACIC AORTIC 09/15/2007  . Nonspecific (abnormal) findings on radiological and other examination of body structure 09/15/2007  . CT, CHEST, ABNORMAL 09/15/2007  . HEADACHE 09/09/2007  . Subarachnoid hemorrhage 07/31/2007  . HIP FRACTURE, RIGHT 07/31/2007  . PAIN IN THORACIC SPINE 04/29/2007  . OSTEOPENIA 02/28/2007  . BENIGN NEOPLASM OF ADRENAL GLAND 01/25/2007  . HYPOTHYROIDISM 01/25/2007  . COPD 01/25/2007  . OSTEOARTHRITIS 01/25/2007  . URINARY INCONTINENCE 01/25/2007  . CERVICAL CANCER, HX OF 01/25/2007  .  CHEST PAIN 01/12/2007  . ANEMIA, B12 DEFICIENCY 08/11/2006  . BREAST CYST, LEFT 08/11/2006  . FOOT SURGERY, HX OF 08/11/2006  . DIZZINESS 07/21/2006  . HYPERLIPIDEMIA 02/15/2006  . HYPERTENSION 02/15/2006  . Personal History of Venous Thrombosis and Embolism 02/15/2006    Past Medical History  Diagnosis Date  . Osteopenia   . History of cervical cancer   . Benign neoplasm of adrenal gland   . Hypothyroidism   . COPD (chronic obstructive pulmonary disease)   . Esophageal reflux   . Osteoarthrosis, unspecified whether generalized or localized, unspecified site   . Unspecified pleural effusion   . Subarachnoid hemorrhage      No coumadin or ASA-Dr.[facility].Marland Kitchenaneurysm..no treatment  . Hypertension   . Hyperlipidemia   . Personal history of venous thrombosis and embolism     No coumadin because of Subarachnoid hemorrhage  . Unspecified disorder resulting from impaired renal function   . Congestive heart failure, unspecified     EF 55-60%, echo, March, 2011  . GERD (gastroesophageal reflux disease)   . Aortic insufficiency     Mild, echo, March, 2011  . Mitral regurgitation     Mild, echo, March, 2011  . Aortic root dilatation     4.5 cm echo, November, 2010 / no mention  . Dizziness   . Anemia   . Chronic kidney disease   . Pulmonary embolism     Also DVT but no Coumadin because of subarachnoid hemorrhage  . Atrial fibrillation     cannot take Coumadin /  Holter May, 2011.. mild bradycardia digoxin stopped / September, 2011, shortness of breath and palpitations.. digoxin resume /  March, 2012 can't find it digoxin on med list... to be restarted  . Shingles     November, 2011  . Ejection fraction     55-60%, echo, March, 2011  . Shortness of breath     Nuclear 07/11/2010,,73%, no ischemia  . Renal insufficiency   . Macular degeneration (senile) of retina, unspecified   . Myalgia and myositis, unspecified   . B12 deficiency anemia   . Pulmonary embolism   . Stroke     December, 2012, probably embolic from atrial fib, patient cannot be treated because of history of an aneurysm.  . Brain aneurysm 2007  . Thrombocytopenia     Dr. Myna Hidalgo    Past Surgical History  Procedure Laterality Date  . Foot surgery    . Cataract extraction    . Cholecystectomy    . Pulmonary embolism surgery      vena cava filter  . Orif hip fracture      07/2007  . Epidural block injection      11/2009  . Abdominal hysterectomy    . Appendectomy    . Total hip arthroplasty      History  Substance Use Topics  . Smoking status: Never Smoker   . Smokeless tobacco: Never Used  . Alcohol Use: No     Family History  Problem Relation Age of Onset  . Aortic aneurysm Brother   . Breast cancer Sister     1st degree relative <50  . Coronary artery disease Mother     1st degree relative <60  . Stroke Father     Female- 1st degree relative <50    Allergies  Allergen Reactions  . Contrast Media (Iodinated Diagnostic Agents) Other (See Comments)    No Contrast due to cerebral aneurysm per patient  . Ciprofloxacin     REACTION:  itching , diarrhea  . Hydrocodone-Acetaminophen     REACTION: hallucination    Medication list has been reviewed and updated.  Current Outpatient Prescriptions on File Prior to Visit  Medication Sig Dispense Refill  . amLODipine (NORVASC) 5 MG tablet Take 1 tablet (5 mg total) by mouth daily.  30 tablet  10  . B Complex-C (B-COMPLEX WITH VITAMIN C) tablet Take 1 tablet by mouth daily.        . Calcium Carbonate-Vit D-Min 600-400 MG-UNIT TABS Take 1 tablet by mouth daily.        . cholecalciferol (VITAMIN D) 1000 UNITS tablet Take 1,000 Units by mouth daily.      Marland Kitchen diltiazem (CARDIZEM CD) 240 MG 24 hr capsule Take 1 capsule (240 mg total) by mouth daily.  30 capsule  8  . Docusate Calcium (STOOL SOFTENER PO) Take 1 tablet by mouth daily.      . ferrous sulfate 325 (65 FE) MG tablet Take 325 mg by mouth daily with breakfast.      . furosemide (LASIX) 40 MG tablet Take 1 tablet (40 mg total) by mouth 2 (two) times daily.  60 tablet  2  . Glucosamine-Chondroitin-Vit C 2000-1200-60 MG/30ML LIQD Take 1 tablet by mouth daily.      Marland Kitchen lactase (LACTAID) 3000 UNITS tablet Take 1 tablet by mouth daily.        Marland Kitchen levothyroxine (SYNTHROID, LEVOTHROID) 50 MCG tablet Take 1 tablet (50 mcg total) by mouth daily.  30 tablet  2  . Melatonin 5 MG TABS Take 2 tablets by mouth at bedtime.      . metoprolol succinate (TOPROL-XL) 25 MG 24 hr tablet Take 1 tablet (25 mg total) by mouth daily.  30 tablet  8  . Misc Natural Products (OSTEO BI-FLEX ADV TRIPLE ST) TABS Take 1 tablet by  mouth daily.       . Multiple Minerals-Vitamins (CALCIUM-MAGNESIUM-ZINC) TABS Take 1 tablet by mouth daily.      . Multiple Vitamin (MULTIVITAMIN) tablet Take 1 tablet by mouth daily.        . NON FORMULARY Take 1 tablet by mouth daily. occuvite adult 50+      . pravastatin (PRAVACHOL) 20 MG tablet TAKE 1 TABLET (20 MG TOTAL) BY MOUTH DAILY.  30 tablet  2  . Probiotic Product (ALIGN) 4 MG CAPS Take 1 capsule by mouth daily.        Marland Kitchen Propylene Glycol (SYSTANE BALANCE OP) Place 1 drop into both eyes 2 (two) times daily.        Marland Kitchen zolpidem (AMBIEN) 5 MG tablet Take 0.5 tablets (2.5 mg total) by mouth at bedtime as needed.  30 tablet  0   No current facility-administered medications on file prior to visit.    Review of Systems:  As per HPI- otherwise negative. Here today with a friend.  Uses a cane to walk  Physical Examination: Filed Vitals:   07/06/12 1227  BP: 124/74  Pulse: 85  Temp: 98.5 F (36.9 C)  Resp: 17   Filed Vitals:   07/06/12 1227  Height: 5\' 3"  (1.6 m)  Weight: 142 lb (64.411 kg)   Body mass index is 25.16 kg/(m^2). Ideal Body Weight: Weight in (lb) to have BMI = 25: 140.8  GEN: WDWN, NAD, Non-toxic, A & O x 3, looks well HEENT: Atraumatic, Normocephalic. Neck supple. No masses, No LAD. Ears and Nose: No external deformity. CV: known a- fib, No M/G/R. No JVD. No thrill. No extra  heart sounds. PULM: CTA B, no wheezes, crackles, rhonchi. No retractions. No resp. distress. No accessory muscle use. ABD: S, NT, ND, +BS. No rebound. No HSM. EXTR: No c/c/e NEURO Normal gait for her- uses her cane for support PSYCH: Normally interactive. Conversant. Not depressed or anxious appearing.  Calm demeanor.  Left lower leg:  She has 2 small wounds on her left shin- the superior one is the newer.  This is covered by a band- aid,  Appears to be a well- closed skin tear.  The inferior wound is covered with steri- strips and appears to be in good repair.  She has trace edema in  the left ankle, and the front of her shin is tender to palpation.  The wounds have slight redness around them.  Negative knee and ankle, no pain with ankle motion.     [facility] reading (PRIMARY) by  Dr. Patsy Lager. Left tib/ fib: negative LEFT TIBIA AND FIBULA - 2 VIEW  Comparison: None.  Findings: There is no acute bony or joint abnormality. The patient has some mild appearing degenerative change about the left knee. Soft tissue structures demonstrate atherosclerosis.  IMPRESSION: No acute finding.  Clinically significant discrepancy from primary report, if provided: None   Results for orders placed in visit on 07/06/12  POCT CBC      Result Value Range   WBC 6.9  4.6 - 10.2 K/uL   Lymph, poc 2.8  0.6 - 3.4   POC LYMPH PERCENT 39.9  10 - 50 %L   MID (cbc) 0.5  0 - 0.9   POC MID % 7.4  0 - 12 %M   POC Granulocyte 3.6  2 - 6.9   Granulocyte percent 52.7  37 - 80 %G   RBC 4.61  4.04 - 5.48 M/uL   Hemoglobin 14.8  12.2 - 16.2 g/dL   HCT, POC 16.1  09.6 - 47.9 %   MCV 103.5 (*) 80 - 97 fL   MCH, POC 32.1 (*) 27 - 31.2 pg   MCHC 31.0 (*) 31.8 - 35.4 g/dL   RDW, POC 04.5     Platelet Count, POC 145  142 - 424 K/uL   MPV 11.0  0 - 99.8 fL    Assessment and Plan: Pain of left lower leg - Plan: POCT CBC, DG Tibia/Fibula Left, Lower Extremity Venous Duplex Left  Cellulitis of leg, left - Plan: doxycycline (VIBRA-TABS) 100 MG tablet  Likely mild cellulitis of leg due to recent wounds.  Will use doxycycline.  No renal adjustment is needed.  Will also send for an ultrasound to ensure no DVT.    Macrocytosis without anemia; Cannot add B12/ folate due to medicare not covering these tests and pt needing to sign an AVN.  Will have to send this information in a letter along with ultrasound report.    Signed Abbe Amsterdam, MD

## 2012-07-06 NOTE — Telephone Encounter (Signed)
Received call from vascular lab stating that doppler was negative and per Dr. Patsy Lager pt to take medications that were prescribed and she will give her a called later.

## 2012-07-06 NOTE — Patient Instructions (Addendum)
We are going to treat you for an infection of your left leg with doxycycline (an antibiotic).  Take it twice a day and be sure to take it with food and water.  We will do an ultrasound of your leg today to be sure there is no blood clot    You can go today for the ultrasound of you leg . You go to [facility] , go to [address] , use the [place] parking it is free Driving directions to [facility] [employer] [facility] [id number]  [phone]  - more info    [address]  [place], [place] [address]     1. Head south on [address] toward [address]      0.5 mi    2. Sharp left onto [address]      0.6 mi    3. Turn left onto the [address] ramp      0.2 mi    4. Merge onto [address]      3.0 mi    5. Continue straight to stay on [address]      0.4 mi    6. Slight left to stay on [address]      1.2 mi    7. Turn right onto [address]      0.1 mi    8. Turn left onto [address]      361 ft    9. Take the 1st left onto [facility]  Destination will be on the right

## 2012-07-07 ENCOUNTER — Telehealth: Payer: Self-pay | Admitting: *Deleted

## 2012-07-07 NOTE — Telephone Encounter (Signed)
As per Dr. Patsy Lager, called patient asked how she was doing she said she was some better taking antibiotic and keeping leg up. She'll call if she has continued pain and/or doesn't feel she's improving as quickly as she should be.  Carollee Leitz, CMA

## 2012-07-21 ENCOUNTER — Other Ambulatory Visit: Payer: Self-pay | Admitting: General Practice

## 2012-07-21 MED ORDER — PRAVASTATIN SODIUM 20 MG PO TABS: ORAL_TABLET | ORAL | Status: DC

## 2012-07-21 NOTE — Telephone Encounter (Signed)
Med filled.  

## 2012-07-26 ENCOUNTER — Other Ambulatory Visit: Payer: Self-pay | Admitting: *Deleted

## 2012-07-26 MED ORDER — FUROSEMIDE 40 MG PO TABS: 40.0000 mg | ORAL_TABLET | Freq: Two times a day (BID) | ORAL | Status: DC

## 2012-08-03 ENCOUNTER — Other Ambulatory Visit: Payer: Self-pay | Admitting: Family Medicine

## 2012-08-20 ENCOUNTER — Ambulatory Visit (INDEPENDENT_AMBULATORY_CARE_PROVIDER_SITE_OTHER): Payer: Medicare Other | Admitting: Family Medicine

## 2012-08-20 VITALS — BP 100/58 | HR 79 | Temp 97.8°F | Resp 20 | Ht 63.0 in | Wt 140.8 lb

## 2012-08-20 DIAGNOSIS — W540XXA Bitten by dog, initial encounter: Secondary | ICD-10-CM

## 2012-08-20 DIAGNOSIS — S81001A Unspecified open wound, right knee, initial encounter: Secondary | ICD-10-CM

## 2012-08-20 DIAGNOSIS — M79609 Pain in unspecified limb: Secondary | ICD-10-CM

## 2012-08-20 DIAGNOSIS — S81851A Open bite, right lower leg, initial encounter: Secondary | ICD-10-CM

## 2012-08-20 DIAGNOSIS — S91009A Unspecified open wound, unspecified ankle, initial encounter: Secondary | ICD-10-CM

## 2012-08-20 DIAGNOSIS — S81009A Unspecified open wound, unspecified knee, initial encounter: Secondary | ICD-10-CM

## 2012-08-20 NOTE — Progress Notes (Signed)
This is a 79 year old woman who was bit by a dog at about noon today. She brought in papers regarding the dog which is up-to-date on its vaccinations. Patient's last tetanus shot was 3 months ago.  Objective: Patient has a 5 cm nobody right lateral mid anterior shin.  Patient has full range of motion of her knee and ankle with no edema, although she has had a CVA leaving RUE somewhat hemiparetic.  The wound was prepped with Betadine, anesthetized with 1% Xylocaine with epi, and then prepared for sutures. 4 sutures were used to close the wound with 4-0 Prolene-3 are horizontal mattress and one is simple.  Wound was then covered with a sterile dressing.   Assessment: Complicated only by virtue of his skin and large flap.  Plan: Patient may take off the compression dressing tomorrow, keep covered for Days with bandages, and return in 8-9 days for suture removal  Signed, Sheila Oats.D.

## 2012-08-25 ENCOUNTER — Encounter: Payer: Self-pay | Admitting: Family Medicine

## 2012-08-25 ENCOUNTER — Ambulatory Visit (INDEPENDENT_AMBULATORY_CARE_PROVIDER_SITE_OTHER): Payer: Medicare Other | Admitting: Family Medicine

## 2012-08-25 VITALS — BP 130/72 | Temp 97.8°F | Resp 18 | Wt 137.6 lb

## 2012-08-25 DIAGNOSIS — D7289 Other specified disorders of white blood cells: Secondary | ICD-10-CM

## 2012-08-25 DIAGNOSIS — S81809A Unspecified open wound, unspecified lower leg, initial encounter: Secondary | ICD-10-CM

## 2012-08-25 DIAGNOSIS — S81851A Open bite, right lower leg, initial encounter: Secondary | ICD-10-CM | POA: Insufficient documentation

## 2012-08-25 DIAGNOSIS — I10 Essential (primary) hypertension: Secondary | ICD-10-CM

## 2012-08-25 DIAGNOSIS — E039 Hypothyroidism, unspecified: Secondary | ICD-10-CM

## 2012-08-25 DIAGNOSIS — R5383 Other fatigue: Secondary | ICD-10-CM

## 2012-08-25 DIAGNOSIS — W540XXA Bitten by dog, initial encounter: Secondary | ICD-10-CM

## 2012-08-25 LAB — BASIC METABOLIC PANEL
CO2: 30 mEq/L (ref 19–32)
Calcium: 9.7 mg/dL (ref 8.4–10.5)
Chloride: 102 mEq/L (ref 96–112)
Creatinine, Ser: 2 mg/dL — ABNORMAL HIGH (ref 0.4–1.2)
Glucose, Bld: 95 mg/dL (ref 70–99)
Sodium: 142 mEq/L (ref 135–145)

## 2012-08-25 LAB — CBC WITH DIFFERENTIAL/PLATELET
Basophils Relative: 0.5 % (ref 0.0–3.0)
Eosinophils Relative: 2.3 % (ref 0.0–5.0)
HCT: 43.4 % (ref 36.0–46.0)
Lymphs Abs: 2.2 10*3/uL (ref 0.7–4.0)
MCV: 97.4 fl (ref 78.0–100.0)
Monocytes Absolute: 0.4 10*3/uL (ref 0.1–1.0)
Monocytes Relative: 6.1 % (ref 3.0–12.0)
Platelets: 139 10*3/uL — ABNORMAL LOW (ref 150.0–400.0)
RBC: 4.46 Mil/uL (ref 3.87–5.11)
WBC: 6.4 10*3/uL (ref 4.5–10.5)

## 2012-08-25 LAB — TSH: TSH: 2.28 u[IU]/mL (ref 0.35–5.50)

## 2012-08-25 MED ORDER — AMLODIPINE BESYLATE 5 MG PO TABS: 2.5000 mg | ORAL_TABLET | Freq: Every day | ORAL | Status: DC

## 2012-08-25 NOTE — Progress Notes (Signed)
  Subjective:    Patient ID: [name], female    DOB: [birth year], 79 y.o.   MRN: [id number]  HPI Pt here c/o fatigue and low bp at home.  Pt also needs labs drawn.  She was bit by a dog last week and was seen in UC--stitched and Dtap given.   Review of Systems As above    Objective:   Physical Exam BP 130/72  Temp(Src) 97.8 F (36.6 C) (Oral)  Resp 18  Wt 137 lb 9.6 oz (62.415 kg)  BMI 24.38 kg/m2 General appearance: alert, cooperative, appears stated age and no distress Nose: Nares normal. Septum midline. Mucosa normal. No drainage or sinus tenderness. Throat: lips, mucosa, and tongue normal; teeth and gums normal Neck: no adenopathy, supple, symmetrical, trachea midline and thyroid not enlarged, symmetric, no tenderness/mass/nodules Lungs: clear to auscultation bilaterally Heart: irregularly irregular rhythm        Assessment & Plan:

## 2012-08-25 NOTE — Patient Instructions (Addendum)
  Cut norvasc in half ----- 2.5 mg daily   Hypertension As your heart beats, it forces blood through your arteries. This force is your blood pressure. If the pressure is too high, it is called hypertension (HTN) or high blood pressure. HTN is dangerous because you may have it and not know it. High blood pressure may mean that your heart has to work harder to pump blood. Your arteries may be narrow or stiff. The extra work puts you at risk for heart disease, stroke, and other problems.  Blood pressure consists of two numbers, a higher number over a lower, 110/72, for example. It is stated as "110 over 72." The ideal is below 120 for the top number (systolic) and under 80 for the bottom (diastolic). Write down your blood pressure today. You should pay close attention to your blood pressure if you have certain conditions such as:  Heart failure.  Prior heart attack.  Diabetes  Chronic kidney disease.  Prior stroke.  Multiple risk factors for heart disease. To see if you have HTN, your blood pressure should be measured while you are seated with your arm held at the level of the heart. It should be measured at least twice. A one-time elevated blood pressure reading (especially in the Emergency Department) does not mean that you need treatment. There may be conditions in which the blood pressure is different between your right and left arms. It is important to see your caregiver soon for a recheck. Most people have essential hypertension which means that there is not a specific cause. This type of high blood pressure may be lowered by changing lifestyle factors such as:  Stress.  Smoking.  Lack of exercise.  Excessive weight.  Drug/tobacco/alcohol use.  Eating less salt. Most people do not have symptoms from high blood pressure until it has caused damage to the body. Effective treatment can often prevent, delay or reduce that damage. TREATMENT  When a cause has been identified, treatment  for high blood pressure is directed at the cause. There are a large number of medications to treat HTN. These fall into several categories, and your caregiver will help you select the medicines that are best for you. Medications may have side effects. You should review side effects with your caregiver. If your blood pressure stays high after you have made lifestyle changes or started on medicines,   Your medication(s) may need to be changed.  Other problems may need to be addressed.  Be certain you understand your prescriptions, and know how and when to take your medicine.  Be sure to follow up with your caregiver within the time frame advised (usually within two weeks) to have your blood pressure rechecked and to review your medications.  If you are taking more than one medicine to lower your blood pressure, make sure you know how and at what times they should be taken. Taking two medicines at the same time can result in blood pressure that is too low. SEEK IMMEDIATE MEDICAL CARE IF:  You develop a severe headache, blurred or changing vision, or confusion.  You have unusual weakness or numbness, or a faint feeling.  You have severe chest or abdominal pain, vomiting, or breathing problems. MAKE SURE YOU:   Understand these instructions.  Will watch your condition.  Will get help right away if you are not doing well or get worse. Document Released: 01/26/2005 Document Revised: 04/20/2011 Document Reviewed: 09/16/2007 [facility] Patient Information 2014 [place], [place].

## 2012-08-25 NOTE — Assessment & Plan Note (Signed)
Check labs con't meds 

## 2012-08-25 NOTE — Assessment & Plan Note (Signed)
bp has been running low at home Dec norvasc to 2.5 mg 1 po qd  rto 1 month or sooner prn

## 2012-08-25 NOTE — Assessment & Plan Note (Signed)
Tetanus utd Still oozing blood---wound cleaned and steri strips put over sutures bandage

## 2012-08-27 ENCOUNTER — Other Ambulatory Visit: Payer: Self-pay | Admitting: Family Medicine

## 2012-08-27 DIAGNOSIS — N289 Disorder of kidney and ureter, unspecified: Secondary | ICD-10-CM

## 2012-08-30 ENCOUNTER — Other Ambulatory Visit: Payer: Self-pay | Admitting: Family Medicine

## 2012-08-30 ENCOUNTER — Ambulatory Visit (INDEPENDENT_AMBULATORY_CARE_PROVIDER_SITE_OTHER): Payer: Medicare Other | Admitting: Physician Assistant

## 2012-08-30 ENCOUNTER — Other Ambulatory Visit: Payer: Self-pay | Admitting: Cardiology

## 2012-08-30 VITALS — BP 110/68 | HR 69 | Temp 97.0°F | Resp 18 | Wt 140.0 lb

## 2012-08-30 DIAGNOSIS — Z4802 Encounter for removal of sutures: Secondary | ICD-10-CM

## 2012-08-30 MED ORDER — AMOXICILLIN-POT CLAVULANATE 875-125 MG PO TABS: 1.0000 | ORAL_TABLET | Freq: Two times a day (BID) | ORAL | Status: DC

## 2012-08-30 NOTE — Progress Notes (Signed)
  Subjective:    Patient ID: [name], female    DOB: 1918-02-26, 79 y.o.   MRN: [id number]  HPI   [name] is a very pleasant 79 yr old female here for removal of sutures placed here 10 days ago.  Previous note reviewed.  Pt states the area is still quite tender.  Pain all along her anterior lower leg.  Also reports pain at the back of the leg up to the buttock, but admits this may be due to her altered gait due to pain from the bite.  She reports that the tissue surrounding the wound is quite tender.  No purulent drainage but the area has continued to ooze blood.  Pt was at PCP 5 days ago and steristrips were added.  She was not placed on antibiotics at the time of the bite.  No fever or chills.   Review of Systems  Constitutional: Negative for fever and chills.  HENT: Negative.   Respiratory: Negative.   Cardiovascular: Negative.   Gastrointestinal: Negative.   Musculoskeletal: Negative.   Skin: Positive for wound.  Neurological: Negative.        Objective:   Physical Exam  Vitals reviewed. Constitutional: She is oriented to person, place, and time. She appears well-developed and well-nourished. No distress.  HENT:  Head: Normocephalic and atraumatic.  Eyes: Conjunctivae are normal. No scleral icterus.  Pulmonary/Chest: Effort normal.  Neurological: She is alert and oriented to person, place, and time.  Skin: Skin is warm and dry.     Healing wound of anterior aspect of right lower leg; approx 1cm of surrounding erythema; TTP throughout lower leg but no warmth or induration; unable to express any drainage from the wound; #4 sutures removed without difficulty  Psychiatric: She has a normal mood and affect.         Assessment & Plan:  Visit for suture removal   [name] is a very pleasant 79 yr old female here for suture removal.  #4 sutures removed without difficulty.  Wound appears to be healing well.  There is surrounding erythema and TTP.   Will be  cautious and start augmentin today.  Pt to RTC if not improving or if concerns arise.

## 2012-09-26 ENCOUNTER — Encounter: Payer: Self-pay | Admitting: Family Medicine

## 2012-09-26 ENCOUNTER — Ambulatory Visit (INDEPENDENT_AMBULATORY_CARE_PROVIDER_SITE_OTHER): Payer: Medicare Other | Admitting: Family Medicine

## 2012-09-26 VITALS — BP 127/87 | HR 85 | Temp 98.0°F | Wt 137.2 lb

## 2012-09-26 DIAGNOSIS — I10 Essential (primary) hypertension: Secondary | ICD-10-CM

## 2012-09-26 DIAGNOSIS — N259 Disorder resulting from impaired renal tubular function, unspecified: Secondary | ICD-10-CM

## 2012-09-26 DIAGNOSIS — M1990 Unspecified osteoarthritis, unspecified site: Secondary | ICD-10-CM

## 2012-09-26 DIAGNOSIS — R229 Localized swelling, mass and lump, unspecified: Secondary | ICD-10-CM

## 2012-09-26 MED ORDER — LEVOTHYROXINE SODIUM 50 MCG PO TABS: 50.0000 ug | ORAL_TABLET | Freq: Every day | ORAL | Status: DC

## 2012-09-26 NOTE — Assessment & Plan Note (Signed)
Nephrology pending.   

## 2012-09-26 NOTE — Assessment & Plan Note (Signed)
Suspicious looking-- pt will f/u derm

## 2012-09-26 NOTE — Progress Notes (Signed)
  Subjective:    Patient here for follow-up of elevated blood pressure.  She  is adherent to a low-salt diet.  Blood pressure is well controlled at home. Cardiac symptoms: none. Patient denies: chest pain, chest pressure/discomfort, claudication, dyspnea, exertional chest pressure/discomfort, irregular heart beat, lower extremity edema, near-syncope, orthopnea, palpitations, paroxysmal nocturnal dyspnea, syncope and tachypnea. Cardiovascular risk factors: advanced age (older than 54 for men, 25 for women), hypertension and sedentary lifestyle. Use of agents associated with hypertension: none. History of target organ damage: chronic kidney disease and stroke.  Pt is feeling much better since lowering the norvasc dose.     Pt is also requesting we look at a lesion on her L shin.  She noticed it about 3 weeks ago.    The following portions of the patient's history were reviewed and updated as appropriate: allergies, current medications, past family history, past medical history, past social history, past surgical history and problem list.  Review of Systems Pertinent items are noted in HPI.     Objective:    BP 127/87  Pulse 85  Temp(Src) 98 F (36.7 C) (Oral)  Wt 137 lb 3.2 oz (62.234 kg)  BMI 24.31 kg/m2  SpO2 98% Lungs: clear to auscultation bilaterally Heart: S1, S2 normal Skin: + hard nodule L shin , scaley ,    Assessment:    Hypertension, normal blood pressure    Plan:    Medication: no change. Dietary sodium restriction. Check blood pressures 2-3 times weekly and record. Follow up: 6 months and as needed.

## 2012-10-17 ENCOUNTER — Other Ambulatory Visit: Payer: Self-pay | Admitting: Cardiology

## 2012-10-17 ENCOUNTER — Telehealth: Payer: Self-pay | Admitting: Family Medicine

## 2012-10-17 NOTE — Telephone Encounter (Signed)
Patient states that Friday her recorded blood sugar levels were 361, 267 and 167. Wants to know what she should do. She feels okay today but wanted to let Dr. Laury Axon know. Please advise.

## 2012-10-18 ENCOUNTER — Ambulatory Visit (INDEPENDENT_AMBULATORY_CARE_PROVIDER_SITE_OTHER): Payer: Medicare Other | Admitting: Family Medicine

## 2012-10-18 ENCOUNTER — Other Ambulatory Visit: Payer: Self-pay | Admitting: Family Medicine

## 2012-10-18 ENCOUNTER — Encounter: Payer: Self-pay | Admitting: Family Medicine

## 2012-10-18 VITALS — BP 122/60 | HR 90 | Temp 98.2°F | Wt 136.2 lb

## 2012-10-18 DIAGNOSIS — R42 Dizziness and giddiness: Secondary | ICD-10-CM

## 2012-10-18 DIAGNOSIS — E039 Hypothyroidism, unspecified: Secondary | ICD-10-CM

## 2012-10-18 DIAGNOSIS — R739 Hyperglycemia, unspecified: Secondary | ICD-10-CM

## 2012-10-18 DIAGNOSIS — R7309 Other abnormal glucose: Secondary | ICD-10-CM

## 2012-10-18 LAB — CBC WITH DIFFERENTIAL/PLATELET
Basophils Relative: 0.6 % (ref 0.0–3.0)
Eosinophils Absolute: 0.2 10*3/uL (ref 0.0–0.7)
HCT: 42.1 % (ref 36.0–46.0)
Lymphs Abs: 2.2 10*3/uL (ref 0.7–4.0)
MCHC: 33.4 g/dL (ref 30.0–36.0)
MCV: 97.4 fl (ref 78.0–100.0)
Monocytes Absolute: 0.4 10*3/uL (ref 0.1–1.0)
Neutrophils Relative %: 55.1 % (ref 43.0–77.0)
Platelets: 131 10*3/uL — ABNORMAL LOW (ref 150.0–400.0)
RBC: 4.32 Mil/uL (ref 3.87–5.11)

## 2012-10-18 LAB — POCT URINALYSIS DIPSTICK
Ketones, UA: NEGATIVE
Leukocytes, UA: NEGATIVE
Urobilinogen, UA: 0.2
pH, UA: 6

## 2012-10-18 LAB — MICROALBUMIN / CREATININE URINE RATIO
Creatinine,U: 219.2 mg/dL
Microalb, Ur: 7.3 mg/dL — ABNORMAL HIGH (ref 0.0–1.9)

## 2012-10-18 LAB — BASIC METABOLIC PANEL
BUN: 40 mg/dL — ABNORMAL HIGH (ref 6–23)
CO2: 30 mEq/L (ref 19–32)
Chloride: 100 mEq/L (ref 96–112)
Creatinine, Ser: 1.9 mg/dL — ABNORMAL HIGH (ref 0.4–1.2)

## 2012-10-18 LAB — HEMOGLOBIN A1C: Hgb A1c MFr Bld: 5.8 % (ref 4.6–6.5)

## 2012-10-18 LAB — HEPATIC FUNCTION PANEL
ALT: 13 U/L (ref 0–35)
Bilirubin, Direct: 0.1 mg/dL (ref 0.0–0.3)
Total Bilirubin: 0.6 mg/dL (ref 0.3–1.2)

## 2012-10-18 LAB — TSH: TSH: 2.23 u[IU]/mL (ref 0.35–5.50)

## 2012-10-18 NOTE — Patient Instructions (Signed)

## 2012-10-18 NOTE — Progress Notes (Signed)
  Subjective:     [name] is a 79 y.o. female who presents for evaluation of dizziness. The symptoms started 5 days ago and are improved. The attacks occur intermittently and last 20 minutes. Positions that worsen symptoms: any motion. Previous workup/treatments: blood work: friend checked bs 361.  Pt BS was ranging 147- 361.   Associated ear symptoms: none. Associated CNS symptoms: none. Recent infections: none. Head trauma: denied. Drug ingestion: none. Noise exposure: no occupational exposure. Family history: non-contributory.  The following portions of the patient's history were reviewed and updated as appropriate: allergies, current medications, past family history, past medical history, past social history, past surgical history and problem list.  Review of Systems Pertinent items are noted in HPI.    Objective:    BP 122/60  Pulse 90  Temp(Src) 98.2 F (36.8 C) (Oral)  Wt 136 lb 3.2 oz (61.78 kg)  BMI 24.13 kg/m2  SpO2 97% General appearance: alert, cooperative, appears stated age and no distress Ears: normal TM's and external ear canals both ears Nose: Nares normal. Septum midline. Mucosa normal. No drainage or sinus tenderness. Throat: lips, mucosa, and tongue normal; teeth and gums normal Neck: no adenopathy, no carotid bruit, no JVD, supple, symmetrical, trachea midline and thyroid not enlarged, symmetric, no tenderness/mass/nodules Lungs: clear to auscultation bilaterally Heart: S1, S2 normal      Assessment:    Vertigo   And hyperglycemia   Plan:    check labs and pt given glucometer to check BS  She will call with BS readings Further tx to be determined when labs are back

## 2012-10-21 ENCOUNTER — Telehealth: Payer: Self-pay | Admitting: Family Medicine

## 2012-10-21 NOTE — Telephone Encounter (Signed)
Patient would like someone to review her lab work with her. thanks

## 2012-10-21 NOTE — Telephone Encounter (Signed)
Spoke with pt, advised what her labs meant.

## 2012-10-25 ENCOUNTER — Telehealth: Payer: Self-pay | Admitting: Hematology & Oncology

## 2012-10-25 ENCOUNTER — Telehealth: Payer: Self-pay | Admitting: Cardiology

## 2012-10-25 MED ORDER — PRAVASTATIN SODIUM 20 MG PO TABS: 20.0000 mg | ORAL_TABLET | Freq: Every day | ORAL | Status: DC

## 2012-10-25 MED ORDER — FUROSEMIDE 40 MG PO TABS: 40.0000 mg | ORAL_TABLET | Freq: Two times a day (BID) | ORAL | Status: DC

## 2012-10-25 NOTE — Telephone Encounter (Signed)
New Problem  Pt states she picked up RX from [facility] /// there was a notation on mediations that she needs an office visit/ labs/// pt states she has just  Recently had a lab appointment and doesn't feel as if she needs this appt to continue with her med's// request a call back to discuss.

## 2012-10-25 NOTE — Telephone Encounter (Signed)
Appointment to f/u with Dr Myrtis Ser is scheduled for 10/14, pt agrees with date and time, and RX's of Furosemide and Pravachol with 6 refills sent to [facility] on [address] to fill per pt request.

## 2012-10-25 NOTE — Telephone Encounter (Signed)
Transferred pt to RN voice mail she wants appointment for negative lab work. I do not believe we have received those labs.

## 2012-10-26 ENCOUNTER — Telehealth: Payer: Self-pay | Admitting: *Deleted

## 2012-10-26 NOTE — Telephone Encounter (Signed)
Returned call to pt about platelets. On 10/18/12 = 131. When she last saw Dr Myna Hidalgo in July 2013 = 115. Explained to her that her platelet count was noted to be below normal but not in a range to be of any harm to her. Gave her the comparison between when she was here last to see Dr Myna Hidalgo to now. She verbalized understanding and appreciated the call.

## 2012-11-22 ENCOUNTER — Encounter: Payer: Self-pay | Admitting: Cardiology

## 2012-11-22 ENCOUNTER — Ambulatory Visit (INDEPENDENT_AMBULATORY_CARE_PROVIDER_SITE_OTHER): Payer: Medicare Other | Admitting: Cardiology

## 2012-11-22 VITALS — BP 124/78 | HR 78 | Ht 63.0 in | Wt 114.4 lb

## 2012-11-22 DIAGNOSIS — I10 Essential (primary) hypertension: Secondary | ICD-10-CM

## 2012-11-22 DIAGNOSIS — I4891 Unspecified atrial fibrillation: Secondary | ICD-10-CM

## 2012-11-22 DIAGNOSIS — R0602 Shortness of breath: Secondary | ICD-10-CM

## 2012-11-22 NOTE — Assessment & Plan Note (Signed)
Blood pressure stable. For some reason her pressure is low in the morning that he stabilizes. She has no symptoms. No further workup.

## 2012-11-22 NOTE — Patient Instructions (Addendum)
Your physician recommends that you continue on your current medications as directed. Please refer to the Current Medication list given to you today.  Your physician wants you to follow-up in: 6 months. You will receive a reminder letter in the mail two months in advance. If you don't receive a letter, please call our office to schedule the follow-up appointment.  

## 2012-11-22 NOTE — Progress Notes (Signed)
HPI  Patient is seen for overall cardiology followup. She's doing remarkably well. She says that her blood pressure is mildly reduced in the morning with no symptoms. She still takes her medicines and her pressure is better as the day goes on. She's not having any syncope or presyncope. Her labs have shown she does have renal dysfunction and she is supposed to followup with the renal doctors.  She does have episodes of brief chest discomfort at home. These last 2-3 minutes and then resolved.  Allergies  Allergen Reactions  . Contrast Media [Iodinated Diagnostic Agents] Other (See Comments)    No Contrast due to cerebral aneurysm per patient  . Ciprofloxacin     REACTION: itching , diarrhea  . Hydrocodone-Acetaminophen     REACTION: hallucination    Current Outpatient Prescriptions  Medication Sig Dispense Refill  . amLODipine (NORVASC) 5 MG tablet Take 0.5 tablets (2.5 mg total) by mouth daily.  30 tablet  10  . B Complex-C (B-COMPLEX WITH VITAMIN C) tablet Take 1 tablet by mouth daily.        . Calcium Carbonate-Vit D-Min 600-400 MG-UNIT TABS Take 1 tablet by mouth daily.        . cholecalciferol (VITAMIN D) 1000 UNITS tablet Take 1,000 Units by mouth daily.      Marland Kitchen diltiazem (CARDIZEM CD) 240 MG 24 hr capsule Take 1 capsule (240 mg total) by mouth daily.  30 capsule  8  . Docusate Calcium (STOOL SOFTENER PO) Take 1 tablet by mouth daily.      . ferrous sulfate 325 (65 FE) MG tablet Take 325 mg by mouth daily with breakfast.      . furosemide (LASIX) 40 MG tablet Take 1 tablet (40 mg total) by mouth 2 (two) times daily.  60 tablet  6  . Glucosamine-Chondroitin-Vit C 2000-1200-60 MG/30ML LIQD Take 1 tablet by mouth daily.      Marland Kitchen levothyroxine (SYNTHROID) 50 MCG tablet Take 1 tablet (50 mcg total) by mouth daily before breakfast. 1 tab by mouth daily--repeat labs are due now  30 tablet  11  . Melatonin 5 MG TABS Take 2 tablets by mouth at bedtime.      . metoprolol succinate  (TOPROL-XL) 25 MG 24 hr tablet Take 1 tablet (25 mg total) by mouth daily.  30 tablet  8  . Misc Natural Products (OSTEO BI-FLEX ADV TRIPLE ST) TABS Take 1 tablet by mouth daily.       . Multiple Minerals-Vitamins (CALCIUM-MAGNESIUM-ZINC) TABS Take 1 tablet by mouth daily.      . Multiple Vitamin (MULTIVITAMIN) tablet Take 1 tablet by mouth daily.        . NON FORMULARY Take 1 tablet by mouth daily. occuvite adult 50+      . pravastatin (PRAVACHOL) 20 MG tablet Take 1 tablet (20 mg total) by mouth daily.  30 tablet  6  . Probiotic Product (ALIGN) 4 MG CAPS Take 1 capsule by mouth daily.        Marland Kitchen Propylene Glycol (SYSTANE BALANCE OP) Place 1 drop into both eyes 2 (two) times daily.        . vitamin B-12 (CYANOCOBALAMIN) 1000 MCG tablet Take 1,000 mcg by mouth daily.      Marland Kitchen zolpidem (AMBIEN) 5 MG tablet Take 0.5 tablets (2.5 mg total) by mouth at bedtime as needed.  30 tablet  0   No current facility-administered medications for this visit.    History   Social History  .  Marital Status: Divorced    Spouse Name: N/A    Number of Children: N/A  . Years of Education: N/A   Occupational History  . Retired    Social History Main Topics  . Smoking status: Never Smoker   . Smokeless tobacco: Never Used  . Alcohol Use: No  . Drug Use: No  . Sexual Activity: No   Other Topics Concern  . Not on file   Social History Narrative   Divorced. Education: [employer]. Exercise in chair daily.    Family History  Problem Relation Age of Onset  . Aortic aneurysm Brother   . Breast cancer Sister     1st degree relative <50  . Coronary artery disease Mother     1st degree relative <60  . Stroke Father     Female- 1st degree relative <50    Past Medical History  Diagnosis Date  . Osteopenia   . History of cervical cancer   . Benign neoplasm of adrenal gland   . Hypothyroidism   . COPD (chronic obstructive pulmonary disease)   . Esophageal reflux   . Osteoarthrosis, unspecified whether  generalized or localized, unspecified site   . Unspecified pleural effusion   . Subarachnoid hemorrhage     No coumadin or ASA-Dr.[facility].Marland Kitchenaneurysm..no treatment  . Hypertension   . Hyperlipidemia   . Personal history of venous thrombosis and embolism     No coumadin because of Subarachnoid hemorrhage  . Unspecified disorder resulting from impaired renal function   . Congestive heart failure, unspecified     EF 55-60%, echo, March, 2011  . GERD (gastroesophageal reflux disease)   . Aortic insufficiency     Mild, echo, March, 2011  . Mitral regurgitation     Mild, echo, March, 2011  . Aortic root dilatation     4.5 cm echo, November, 2010 / no mention  . Dizziness   . Anemia   . Chronic kidney disease   . Pulmonary embolism     Also DVT but no Coumadin because of subarachnoid hemorrhage  . Atrial fibrillation     cannot take Coumadin /  Holter May, 2011.. mild bradycardia digoxin stopped / September, 2011, shortness of breath and palpitations.. digoxin resume /  March, 2012 can't find it digoxin on med list... to be restarted  . Shingles     November, 2011  . Ejection fraction     55-60%, echo, March, 2011  . Shortness of breath     Nuclear 07/11/2010,,73%, no ischemia  . Renal insufficiency   . Macular degeneration (senile) of retina, unspecified   . Myalgia and myositis, unspecified   . B12 deficiency anemia   . Pulmonary embolism   . Stroke     December, 2012, probably embolic from atrial fib, patient cannot be treated because of history of an aneurysm.  . Brain aneurysm 2007  . Thrombocytopenia     Dr. Myna Hidalgo  . Clotting disorder   . Osteoporosis     Past Surgical History  Procedure Laterality Date  . Foot surgery    . Cataract extraction    . Cholecystectomy    . Pulmonary embolism surgery      vena cava filter  . Orif hip fracture      07/2007  . Epidural block injection      11/2009  . Abdominal hysterectomy    . Appendectomy    . Total hip  arthroplasty    . Joint replacement  Patient Active Problem List   Diagnosis Date Noted  . Shortness of breath     Priority: High  . Skin nodule 09/26/2012  . Dog bite of right lower leg 08/25/2012  . Thrombocytopenia   . Fatigue 07/13/2011  . Depression 06/10/2011  . Excessive gas 06/10/2011  . Stroke   . Brain aneurysm   . CKD (chronic kidney disease) stage 3, GFR 30-59 ml/min 02/11/2011  . CVA (cerebral infarction) 02/09/2011    Class: Acute  . Hemiplegia affecting dominant side 02/09/2011    Class: Acute  . Chronic diarrhea 09/17/2010  . Congestive heart failure, unspecified   . Ejection fraction   . Atrial fibrillation   . Unspecified pleural effusion   . Subarachnoid hemorrhage   . Aortic insufficiency   . Mitral regurgitation   . Aortic root dilatation   . Anemia   . Pulmonary embolism   . Atrial fibrillation   . Shingles   . BRUISE 03/19/2010  . TINEA CORPORIS 12/24/2009  . RENAL INSUFFICIENCY 08/20/2009  . GERD 06/06/2009  . EDEMA 05/03/2009  . ATRIAL FIBRILLATION WITH RAPID VENTRICULAR RESPONSE 04/10/2009  . DIVERTICULITIS, ACUTE 03/18/2009  . RENAL CYST 03/18/2009  . DYSPNEA/SHORTNESS OF BREATH 02/13/2009  . MACULAR DEGENERATION, EARLY 01/14/2009  . SHORTNESS OF BREATH 12/03/2008  . AORTIC INSUFFICIENCY 12/02/2008  . UNSPECIFIED THROMBOCYTOPENIA 10/26/2008  . MYALGIA 05/24/2008  . BACK PAIN 01/02/2008  . ANEURYSM, THORACIC AORTIC 09/15/2007  . Nonspecific (abnormal) findings on radiological and other examination of body structure 09/15/2007  . CT, CHEST, ABNORMAL 09/15/2007  . HEADACHE 09/09/2007  . Subarachnoid hemorrhage 07/31/2007  . HIP FRACTURE, RIGHT 07/31/2007  . PAIN IN THORACIC SPINE 04/29/2007  . OSTEOPENIA 02/28/2007  . BENIGN NEOPLASM OF ADRENAL GLAND 01/25/2007  . HYPOTHYROIDISM 01/25/2007  . COPD 01/25/2007  . OSTEOARTHRITIS 01/25/2007  . URINARY INCONTINENCE 01/25/2007  . CERVICAL CANCER, HX OF 01/25/2007  . CHEST PAIN  01/12/2007  . ANEMIA, B12 DEFICIENCY 08/11/2006  . BREAST CYST, LEFT 08/11/2006  . FOOT SURGERY, HX OF 08/11/2006  . DIZZINESS 07/21/2006  . HYPERLIPIDEMIA 02/15/2006  . HYPERTENSION 02/15/2006  . Personal History of Venous Thrombosis and Embolism 02/15/2006    ROS   Patient denies fever, chills, headache, sweats, rash, change in vision, change in hearing,, cough, nausea vomiting, urinary symptoms. All other systems are reviewed and are negative.  PHYSICAL EXAM   Patient actually looks good. She came in walking with her walker. She is with the lady who stays with her. She is oriented to person time and place. Affect is normal. There is no jugulovenous distention. Lungs are clear. Respiratory effort is nonlabored. Cardiac exam reveals S1 and S2. Abdomen is soft. She has trace peripheral edema. Filed Vitals:   11/22/12 1540  BP: 124/78  Pulse: 78  Height: 5\' 3"  (1.6 m)  Weight: 114 lb 6.4 oz (51.891 kg)      ASSESSMENT & PLAN

## 2012-11-22 NOTE — Assessment & Plan Note (Signed)
She's not having any significant shortness of breath. No change in therapy.

## 2012-11-22 NOTE — Assessment & Plan Note (Signed)
The patient's atrial fibrillation is controlled. She cannot receive any type of anticoagulant because of prior bleeding in the head.

## 2012-11-26 ENCOUNTER — Other Ambulatory Visit: Payer: Self-pay | Admitting: Family Medicine

## 2012-11-28 ENCOUNTER — Other Ambulatory Visit: Payer: Self-pay | Admitting: General Practice

## 2012-11-28 MED ORDER — ZOLPIDEM TARTRATE 5 MG PO TABS: ORAL_TABLET | ORAL | Status: DC

## 2012-11-28 NOTE — Telephone Encounter (Signed)
Med called in due to being approved on 10/18.

## 2012-11-28 NOTE — Telephone Encounter (Signed)
Last OV 10-18-12 Med filled 01-13-12 #30 with 0 refills  Low Risk

## 2012-12-08 ENCOUNTER — Telehealth: Payer: Self-pay | Admitting: *Deleted

## 2012-12-08 MED ORDER — TRAZODONE HCL 50 MG PO TABS: ORAL_TABLET | ORAL | Status: DC

## 2012-12-08 NOTE — Telephone Encounter (Signed)
Patient called and stated that she is unable to sleep due to MRSA that she has in leg. Patient states that she has been taking melatonin for sleep but that is not working. Would like to know if there is anything that you can call in for her for sleep or she doesn't mind coming in for an appointment. Please advise. SW

## 2012-12-08 NOTE — Telephone Encounter (Signed)
trazadone 50 mg  1/2 - 1 po qhs prn sleep  #30

## 2012-12-08 NOTE — Telephone Encounter (Signed)
Patient called and stated that she was on an antibiotic for her MRSA (Doxycycline hyclate 100mg ). She spoke with her provider that prescribed her that medication, and stated that he ws going to lower or change the antibiotic because he believes it was to strong for her. Patient states that she still needs something for sleep. Rx filled and sent to pharmacy

## 2012-12-15 ENCOUNTER — Other Ambulatory Visit: Payer: Self-pay

## 2013-01-09 ENCOUNTER — Ambulatory Visit (INDEPENDENT_AMBULATORY_CARE_PROVIDER_SITE_OTHER): Payer: Medicare Other | Admitting: Family Medicine

## 2013-01-09 ENCOUNTER — Encounter: Payer: Self-pay | Admitting: Family Medicine

## 2013-01-09 VITALS — BP 122/70 | HR 82 | Temp 98.1°F | Wt 136.4 lb

## 2013-01-09 DIAGNOSIS — D0370 Melanoma in situ of unspecified lower limb, including hip: Secondary | ICD-10-CM | POA: Insufficient documentation

## 2013-01-09 DIAGNOSIS — D0372 Melanoma in situ of left lower limb, including hip: Secondary | ICD-10-CM

## 2013-01-09 DIAGNOSIS — E785 Hyperlipidemia, unspecified: Secondary | ICD-10-CM

## 2013-01-09 DIAGNOSIS — C4370 Malignant melanoma of unspecified lower limb, including hip: Secondary | ICD-10-CM

## 2013-01-09 DIAGNOSIS — E039 Hypothyroidism, unspecified: Secondary | ICD-10-CM

## 2013-01-09 DIAGNOSIS — Z23 Encounter for immunization: Secondary | ICD-10-CM

## 2013-01-09 NOTE — Addendum Note (Signed)
Addended by: Arnette Norris on: 01/09/2013 10:39 AM   Modules accepted: Orders, SmartSet

## 2013-01-09 NOTE — Progress Notes (Signed)
  Subjective:    Patient here for follow-up of elevated blood pressure.  She is not exercising and is adherent to a low-salt diet.  Blood pressure is well controlled at home. Cardiac symptoms: fatigue---but she is feeling much better since stopping supplements.. Patient denies: irregular heart beat, lower extremity edema and near-syncope. Cardiovascular risk factors: advanced age (older than 53 for men, 62 for women), dyslipidemia, hypertension and sedentary lifestyle. Use of agents associated with hypertension: none. History of target organ damage: stroke.  The following portions of the patient's history were reviewed and updated as appropriate: allergies, current medications, past family history, past medical history, past social history, past surgical history and problem list.  Review of Systems Pertinent items are noted in HPI.     Objective:    BP 122/70  Pulse 82  Temp(Src) 98.1 F (36.7 C) (Oral)  Wt 136 lb 6.4 oz (61.871 kg)  SpO2 94% General appearance: alert, cooperative, appears stated age and no distress Lungs: clear to auscultation bilaterally Heart: S1, S2 normal Extremities: edema tr pitting    Assessment:    Hypertension, normal blood pressure . Evidence of target organ damage: stroke.    Plan:    Medication: no change. Dietary sodium restriction. Follow up: 6 months and as needed.

## 2013-01-09 NOTE — Progress Notes (Signed)
Pre visit review using our clinic review tool, if applicable. No additional management support is needed unless otherwise documented below in the visit note. 

## 2013-01-09 NOTE — Patient Instructions (Addendum)
F/u for labs soon------ov in 6 months    Hypothyroidism The thyroid is a large gland located in the lower front of your neck. The thyroid gland helps control metabolism. Metabolism is how your body handles food. It controls metabolism with the hormone thyroxine. When this gland is underactive (hypothyroid), it produces too little hormone.  CAUSES These include:   Absence or destruction of thyroid tissue.  Goiter due to iodine deficiency.  Goiter due to medications.  Congenital defects (since birth).  Problems with the pituitary. This causes a lack of TSH (thyroid stimulating hormone). This hormone tells the thyroid to turn out more hormone. SYMPTOMS  Lethargy (feeling as though you have no energy)  Cold intolerance  Weight gain (in spite of normal food intake)  Dry skin  Coarse hair  Menstrual irregularity (if severe, may lead to infertility)  Slowing of thought processes Cardiac problems are also caused by insufficient amounts of thyroid hormone. Hypothyroidism in the newborn is cretinism, and is an extreme form. It is important that this form be treated adequately and immediately or it will lead rapidly to retarded physical and mental development. DIAGNOSIS  To prove hypothyroidism, your caregiver may do blood tests and ultrasound tests. Sometimes the signs are hidden. It may be necessary for your caregiver to watch this illness with blood tests either before or after diagnosis and treatment. TREATMENT  Low levels of thyroid hormone are increased by using synthetic thyroid hormone. This is a safe, effective treatment. It usually takes about four weeks to gain the full effects of the medication. After you have the full effect of the medication, it will generally take another four weeks for problems to leave. Your caregiver may start you on low doses. If you have had heart problems the dose may be gradually increased. It is generally not an emergency to get rapidly to  normal. HOME CARE INSTRUCTIONS   Take your medications as your caregiver suggests. Let your caregiver know of any medications you are taking or start taking. Your caregiver will help you with dosage schedules.  As your condition improves, your dosage needs may increase. It will be necessary to have continuing blood tests as suggested by your caregiver.  Report all suspected medication side effects to your caregiver. SEEK MEDICAL CARE IF: Seek medical care if you develop:  Sweating.  Tremulousness (tremors).  Anxiety.  Rapid weight loss.  Heat intolerance.  Emotional swings.  Diarrhea.  Weakness. SEEK IMMEDIATE MEDICAL CARE IF:  You develop chest pain, an irregular heart beat (palpitations), or a rapid heart beat. MAKE SURE YOU:   Understand these instructions.  Will watch your condition.  Will get help right away if you are not doing well or get worse. Document Released: 01/26/2005 Document Revised: 04/20/2011 Document Reviewed: 09/16/2007 [facility] Patient Information 2014 [place], [place].

## 2013-03-02 ENCOUNTER — Other Ambulatory Visit: Payer: Self-pay | Admitting: *Deleted

## 2013-03-02 ENCOUNTER — Other Ambulatory Visit: Payer: Self-pay | Admitting: Cardiology

## 2013-03-02 MED ORDER — AMLODIPINE BESYLATE 5 MG PO TABS: 2.5000 mg | ORAL_TABLET | Freq: Every day | ORAL | Status: DC

## 2013-03-24 ENCOUNTER — Other Ambulatory Visit: Payer: Self-pay | Admitting: Cardiology

## 2013-04-14 ENCOUNTER — Other Ambulatory Visit: Payer: Self-pay | Admitting: Cardiology

## 2013-05-01 ENCOUNTER — Other Ambulatory Visit: Payer: Self-pay | Admitting: *Deleted

## 2013-05-01 MED ORDER — DILTIAZEM HCL ER 240 MG PO CP24: ORAL_CAPSULE | ORAL | Status: DC

## 2013-05-26 ENCOUNTER — Other Ambulatory Visit: Payer: Self-pay

## 2013-05-26 MED ORDER — AMLODIPINE BESYLATE 5 MG PO TABS: 2.5000 mg | ORAL_TABLET | Freq: Every day | ORAL | Status: DC

## 2013-05-29 ENCOUNTER — Ambulatory Visit (INDEPENDENT_AMBULATORY_CARE_PROVIDER_SITE_OTHER): Payer: Medicare Other | Admitting: Cardiology

## 2013-05-29 ENCOUNTER — Encounter: Payer: Self-pay | Admitting: Cardiology

## 2013-05-29 VITALS — BP 148/92 | HR 80 | Ht 63.0 in | Wt 139.8 lb

## 2013-05-29 DIAGNOSIS — I509 Heart failure, unspecified: Secondary | ICD-10-CM

## 2013-05-29 DIAGNOSIS — N183 Chronic kidney disease, stage 3 unspecified: Secondary | ICD-10-CM

## 2013-05-29 DIAGNOSIS — R609 Edema, unspecified: Secondary | ICD-10-CM

## 2013-05-29 DIAGNOSIS — I10 Essential (primary) hypertension: Secondary | ICD-10-CM

## 2013-05-29 DIAGNOSIS — R0602 Shortness of breath: Secondary | ICD-10-CM

## 2013-05-29 DIAGNOSIS — I4891 Unspecified atrial fibrillation: Secondary | ICD-10-CM

## 2013-05-29 DIAGNOSIS — E039 Hypothyroidism, unspecified: Secondary | ICD-10-CM

## 2013-05-29 LAB — BASIC METABOLIC PANEL
BUN: 47 mg/dL — AB (ref 6–23)
CALCIUM: 9.2 mg/dL (ref 8.4–10.5)
CO2: 27 meq/L (ref 19–32)
Chloride: 103 mEq/L (ref 96–112)
Creatinine, Ser: 1.8 mg/dL — ABNORMAL HIGH (ref 0.4–1.2)
GFR: 27.55 mL/min — AB (ref >60.00)
GLUCOSE: 77 mg/dL (ref 70–99)
Potassium: 4.2 mEq/L (ref 3.5–5.1)
SODIUM: 142 meq/L (ref 135–145)

## 2013-05-29 LAB — CBC WITH DIFFERENTIAL/PLATELET
Basophils Absolute: 0 10*3/uL (ref 0.0–0.1)
Basophils Relative: 0.7 % (ref 0.0–3.0)
Eosinophils Absolute: 0.2 10*3/uL (ref 0.0–0.7)
Eosinophils Relative: 2.6 % (ref 0.0–5.0)
HEMATOCRIT: 40.4 % (ref 36.0–46.0)
HEMOGLOBIN: 13.5 g/dL (ref 12.0–15.0)
LYMPHS ABS: 2.3 10*3/uL (ref 0.7–4.0)
Lymphocytes Relative: 34.8 % (ref 12.0–46.0)
MCHC: 33.4 g/dL (ref 30.0–36.0)
MCV: 98.4 fl (ref 78.0–100.0)
Monocytes Absolute: 0.6 10*3/uL (ref 0.1–1.0)
Monocytes Relative: 8.6 % (ref 3.0–12.0)
NEUTROS ABS: 3.4 10*3/uL (ref 1.4–7.7)
Neutrophils Relative %: 53.3 % (ref 43.0–77.0)
Platelets: 133 10*3/uL — ABNORMAL LOW (ref 150.0–400.0)
RBC: 4.1 Mil/uL (ref 3.87–5.11)
RDW: 14 % (ref 11.5–14.6)
WBC: 6.5 10*3/uL (ref 4.5–10.5)

## 2013-05-29 LAB — TSH: TSH: 2.24 u[IU]/mL (ref 0.35–5.50)

## 2013-05-29 MED ORDER — DILTIAZEM HCL ER 240 MG PO CP24: 240.0000 mg | ORAL_CAPSULE | Freq: Two times a day (BID) | ORAL | Status: DC

## 2013-05-29 NOTE — Assessment & Plan Note (Signed)
TSH will be checked today.

## 2013-05-29 NOTE — Assessment & Plan Note (Signed)
The patient may be having symptoms from increase atrial fib rate. She is on both diltiazem and amlodipine. I will increase her diltiazem hold amlodipine and see her back for early followup. She cannot be anticoagulated because of multiple complications in the past.

## 2013-05-29 NOTE — Assessment & Plan Note (Signed)
The patient shortness of breath is multifactorial. It does not appear to be related to cardiac ischemia. Increased atrial fib rate with walking may play a role. We will adjust medicines for this. I am not inclined to change her diuretics.

## 2013-05-29 NOTE — Assessment & Plan Note (Signed)
Her overall volume status is stable. No change in therapy.

## 2013-05-29 NOTE — Patient Instructions (Signed)
Your physician has recommended you make the following change in your medication: increase Diltiazem 240 mg to twice daily and hold Amlodipine   Your physician recommends that you return for lab work in: today  Your physician recommends that you schedule a follow-up appointment in: 3 weeks

## 2013-05-29 NOTE — Assessment & Plan Note (Signed)
The patient has venous insufficiency. I've encouraged her to elevate her feet when she is not ambulating. I'm also encouraging her to use support hose even if we use a very mild strength

## 2013-05-29 NOTE — Assessment & Plan Note (Signed)
Blood pressure is controlled. No change in therapy. 

## 2013-05-29 NOTE — Progress Notes (Signed)
Patient ID: [name], female   DOB: 01-25-1919, 80 y.o.   MRN: [id number]    HPI  Patient is seen today to followup atrial fibrillation and shortness of breath. She's not having any significant chest pain. She is having exertional shortness of breath. She also has edema worse in the left leg than the right. This is primarily related to venous insufficiency.  Allergies  Allergen Reactions  . Contrast Media [Iodinated Diagnostic Agents] Other (See Comments)    No Contrast due to cerebral aneurysm per patient  . Ciprofloxacin     REACTION: itching , diarrhea  . Hydrocodone-Acetaminophen     REACTION: hallucination  . Pollen Extract     Current Outpatient Prescriptions  Medication Sig Dispense Refill  . amLODipine (NORVASC) 5 MG tablet Take 0.5 tablets (2.5 mg total) by mouth daily.  15 tablet  2  . Cyanocobalamin (B-12) 5000 MCG SUBL Place 1 tablet under the tongue daily.      Marland Kitchen diltiazem (DILACOR XR) 240 MG 24 hr capsule TAKE ONE CAPSULE BY MOUTH EVERY DAY  30 capsule  0  . Docusate Calcium (STOOL SOFTENER PO) Take 1 tablet by mouth daily.      . ferrous sulfate 325 (65 FE) MG tablet Take 325 mg by mouth daily with breakfast.      . furosemide (LASIX) 40 MG tablet Take 1 tablet (40 mg total) by mouth 2 (two) times daily.  60 tablet  6  . levothyroxine (SYNTHROID, LEVOTHROID) 50 MCG tablet Take 50 mcg by mouth daily before breakfast. repeat labs are due now      . Magnesium 250 MG TABS Take 1 tablet by mouth daily.      . metoprolol succinate (TOPROL-XL) 25 MG 24 hr tablet TAKE 1 TABLET (25 MG TOTAL) BY MOUTH DAILY.  30 tablet  8  . multivitamin-lutein (OCUVITE-LUTEIN) CAPS capsule Take 1 capsule by mouth daily.      . Potassium Gluconate 595 MG CAPS Take 1 capsule by mouth daily.      . pravastatin (PRAVACHOL) 20 MG tablet Take 1 tablet (20 mg total) by mouth daily.  30 tablet  6  . Probiotic Product (ALIGN) 4 MG CAPS Take 1 capsule by mouth daily.        Marland Kitchen Propylene Glycol  (SYSTANE BALANCE OP) Place 1 drop into both eyes 2 (two) times daily.        . [DISCONTINUED] diltiazem (CARDIZEM CD) 240 MG 24 hr capsule Take 1 capsule (240 mg total) by mouth daily.  30 capsule  8   No current facility-administered medications for this visit.    History   Social History  . Marital Status: Divorced    Spouse Name: N/A    Number of Children: N/A  . Years of Education: N/A   Occupational History  . Retired    Social History Main Topics  . Smoking status: Never Smoker   . Smokeless tobacco: Never Used  . Alcohol Use: No  . Drug Use: No  . Sexual Activity: No   Other Topics Concern  . Not on file   Social History Narrative   Divorced. Education: [employer]. Exercise in chair daily.    Family History  Problem Relation Age of Onset  . Aortic aneurysm Brother   . Breast cancer Sister     1st degree relative <50  . Coronary artery disease Mother     1st degree relative <60  . Stroke Father     Female-  1st degree relative <50    Past Medical History  Diagnosis Date  . Osteopenia   . History of cervical cancer   . Benign neoplasm of adrenal gland   . Hypothyroidism   . COPD (chronic obstructive pulmonary disease)   . Esophageal reflux   . Osteoarthrosis, unspecified whether generalized or localized, unspecified site   . Unspecified pleural effusion   . Subarachnoid hemorrhage     No coumadin or ASA-Dr.Poole Neurosurgery.Marland Kitchenaneurysm..no treatment  . Hypertension   . Hyperlipidemia   . Personal history of venous thrombosis and embolism     No coumadin because of Subarachnoid hemorrhage  . Unspecified disorder resulting from impaired renal function   . Congestive heart failure, unspecified     EF 55-60%, echo, March, 2011  . GERD (gastroesophageal reflux disease)   . Aortic insufficiency     Mild, echo, March, 2011  . Mitral regurgitation     Mild, echo, March, 2011  . Aortic root dilatation     4.5 cm echo, November, 2010 / no mention  .  Dizziness   . Anemia   . Chronic kidney disease   . Pulmonary embolism     Also DVT but no Coumadin because of subarachnoid hemorrhage  . Atrial fibrillation     cannot take Coumadin /  Holter May, 2011.. mild bradycardia digoxin stopped / September, 2011, shortness of breath and palpitations.. digoxin resume /  March, 2012 can't find it digoxin on med list... to be restarted  . Shingles     November, 2011  . Ejection fraction     55-60%, echo, March, 2011  . Shortness of breath     Nuclear 07/11/2010,,73%, no ischemia  . Renal insufficiency   . Macular degeneration (senile) of retina, unspecified   . Myalgia and myositis, unspecified   . B12 deficiency anemia   . Pulmonary embolism   . Stroke     December, 5027, probably embolic from atrial fib, patient cannot be treated because of history of an aneurysm.  . Brain aneurysm 2007  . Thrombocytopenia     Dr. Marin Olp  . Clotting disorder   . Osteoporosis     Past Surgical History  Procedure Laterality Date  . Foot surgery    . Cataract extraction    . Cholecystectomy    . Pulmonary embolism surgery      vena cava filter  . Orif hip fracture      07/2007  . Epidural block injection      11/2009  . Abdominal hysterectomy    . Appendectomy    . Total hip arthroplasty    . Joint replacement      Patient Active Problem List   Diagnosis Date Noted  . Shortness of breath     Priority: High  . Melanoma in situ of lower leg 01/09/2013  . Skin nodule 09/26/2012  . Dog bite of right lower leg 08/25/2012  . Thrombocytopenia   . Fatigue 07/13/2011  . Depression 06/10/2011  . Excessive gas 06/10/2011  . Stroke   . Brain aneurysm   . CKD (chronic kidney disease) stage 3, GFR 30-59 ml/min 02/11/2011  . CVA (cerebral infarction) 02/09/2011    Class: Acute  . Hemiplegia affecting dominant side 02/09/2011    Class: Acute  . Chronic diarrhea 09/17/2010  . Congestive heart failure, unspecified   . Ejection fraction   . Atrial  fibrillation   . Unspecified pleural effusion   . Subarachnoid hemorrhage   . Aortic insufficiency   .  Mitral regurgitation   . Aortic root dilatation   . Anemia   . Pulmonary embolism   . Atrial fibrillation   . Shingles   . BRUISE 03/19/2010  . TINEA CORPORIS 12/24/2009  . RENAL INSUFFICIENCY 08/20/2009  . GERD 06/06/2009  . EDEMA 05/03/2009  . ATRIAL FIBRILLATION WITH RAPID VENTRICULAR RESPONSE 04/10/2009  . DIVERTICULITIS, ACUTE 03/18/2009  . RENAL CYST 03/18/2009  . DYSPNEA/SHORTNESS OF BREATH 02/13/2009  . MACULAR DEGENERATION, EARLY 01/14/2009  . SHORTNESS OF BREATH 12/03/2008  . AORTIC INSUFFICIENCY 12/02/2008  . UNSPECIFIED THROMBOCYTOPENIA 10/26/2008  . MYALGIA 05/24/2008  . BACK PAIN 01/02/2008  . ANEURYSM, THORACIC AORTIC 09/15/2007  . Nonspecific (abnormal) findings on radiological and other examination of body structure 09/15/2007  . CT, CHEST, ABNORMAL 09/15/2007  . HEADACHE 09/09/2007  . Subarachnoid hemorrhage 07/31/2007  . HIP FRACTURE, RIGHT 07/31/2007  . PAIN IN THORACIC SPINE 04/29/2007  . OSTEOPENIA 02/28/2007  . BENIGN NEOPLASM OF ADRENAL GLAND 01/25/2007  . HYPOTHYROIDISM 01/25/2007  . COPD 01/25/2007  . OSTEOARTHRITIS 01/25/2007  . URINARY INCONTINENCE 01/25/2007  . CERVICAL CANCER, HX OF 01/25/2007  . CHEST PAIN 01/12/2007  . ANEMIA, B12 DEFICIENCY 08/11/2006  . BREAST CYST, LEFT 08/11/2006  . FOOT SURGERY, HX OF 08/11/2006  . DIZZINESS 07/21/2006  . HYPERLIPIDEMIA 02/15/2006  . HYPERTENSION 02/15/2006  . Personal History of Venous Thrombosis and Embolism 02/15/2006    ROS   Patient denies fever, chills, headache, sweats, rash, change in vision, change in hearing, chest pain, cough, nausea vomiting, urinary symptoms. All other systems are reviewed and are negative.  PHYSICAL EXAM  Patient is oriented to person time and place. Affect is normal. There is no jugulovenous distention. There are decreased breath sounds. There is no respiratory  distress. Cardiac exam reveals S1 and S2. The rhythm is irregularly irregular. Abdomen is soft. She has skin changes of chronic venous insufficiency.  Filed Vitals:   05/29/13 1035  BP: 148/92  Pulse: 80  Height: 5\' 3"  (1.6 m)  Weight: 139 lb 12.8 oz (63.413 kg)   EKG is done today and reviewed by me. There is atrial fibrillation. The rate is 80 in the office today.  ASSESSMENT & PLAN

## 2013-05-29 NOTE — Assessment & Plan Note (Signed)
Renal function will be checked today.

## 2013-06-01 ENCOUNTER — Telehealth: Payer: Self-pay | Admitting: Family Medicine

## 2013-06-01 NOTE — Telephone Encounter (Signed)
Caller name: Relation to pt: PCP: Call back number: Pharmacy:  Reason for call:

## 2013-06-05 ENCOUNTER — Other Ambulatory Visit (INDEPENDENT_AMBULATORY_CARE_PROVIDER_SITE_OTHER): Payer: Medicare Other

## 2013-06-05 DIAGNOSIS — E039 Hypothyroidism, unspecified: Secondary | ICD-10-CM

## 2013-06-05 DIAGNOSIS — E785 Hyperlipidemia, unspecified: Secondary | ICD-10-CM

## 2013-06-05 LAB — CBC WITH DIFFERENTIAL/PLATELET
BASOS ABS: 0 10*3/uL (ref 0.0–0.1)
Basophils Relative: 0.7 % (ref 0.0–3.0)
EOS ABS: 0.3 10*3/uL (ref 0.0–0.7)
Eosinophils Relative: 4.6 % (ref 0.0–5.0)
HEMATOCRIT: 42.3 % (ref 36.0–46.0)
Hemoglobin: 13.8 g/dL (ref 12.0–15.0)
Lymphocytes Relative: 32.7 % (ref 12.0–46.0)
Lymphs Abs: 2 10*3/uL (ref 0.7–4.0)
MCHC: 32.6 g/dL (ref 30.0–36.0)
MCV: 98.8 fl (ref 78.0–100.0)
MONO ABS: 0.3 10*3/uL (ref 0.1–1.0)
Monocytes Relative: 5.2 % (ref 3.0–12.0)
NEUTROS ABS: 3.5 10*3/uL (ref 1.4–7.7)
Neutrophils Relative %: 56.8 % (ref 43.0–77.0)
Platelets: 169 10*3/uL (ref 150.0–400.0)
RBC: 4.28 Mil/uL (ref 3.87–5.11)
RDW: 14 % (ref 11.5–14.6)
WBC: 6.2 10*3/uL (ref 4.5–10.5)

## 2013-06-05 LAB — HEPATIC FUNCTION PANEL
ALT: 13 U/L (ref 0–35)
AST: 24 U/L (ref 0–37)
Albumin: 4.4 g/dL (ref 3.5–5.2)
Alkaline Phosphatase: 80 U/L (ref 39–117)
BILIRUBIN TOTAL: 0.5 mg/dL (ref 0.3–1.2)
Bilirubin, Direct: 0.1 mg/dL (ref 0.0–0.3)
Total Protein: 8.2 g/dL (ref 6.0–8.3)

## 2013-06-05 LAB — LIPID PANEL
CHOLESTEROL: 164 mg/dL (ref 0–200)
HDL: 59.5 mg/dL (ref >39.00)
LDL Cholesterol: 79 mg/dL (ref 0–99)
TRIGLYCERIDES: 130 mg/dL (ref 0.0–149.0)
Total CHOL/HDL Ratio: 3
VLDL: 26 mg/dL (ref 0.0–40.0)

## 2013-06-05 LAB — BASIC METABOLIC PANEL
BUN: 41 mg/dL — AB (ref 6–23)
CHLORIDE: 103 meq/L (ref 96–112)
CO2: 26 mEq/L (ref 19–32)
CREATININE: 1.9 mg/dL — AB (ref 0.4–1.2)
Calcium: 9.5 mg/dL (ref 8.4–10.5)
GFR: 25.59 mL/min — ABNORMAL LOW (ref >60.00)
GLUCOSE: 93 mg/dL (ref 70–99)
POTASSIUM: 3.9 meq/L (ref 3.5–5.1)
Sodium: 147 mEq/L — ABNORMAL HIGH (ref 135–145)

## 2013-06-05 LAB — TSH: TSH: 2.8 u[IU]/mL (ref 0.35–5.50)

## 2013-06-19 ENCOUNTER — Encounter: Payer: Self-pay | Admitting: Cardiology

## 2013-06-19 ENCOUNTER — Ambulatory Visit (INDEPENDENT_AMBULATORY_CARE_PROVIDER_SITE_OTHER): Payer: Medicare Other | Admitting: Cardiology

## 2013-06-19 VITALS — BP 153/93 | HR 79 | Ht 63.0 in | Wt 137.8 lb

## 2013-06-19 DIAGNOSIS — R0602 Shortness of breath: Secondary | ICD-10-CM

## 2013-06-19 DIAGNOSIS — I4891 Unspecified atrial fibrillation: Secondary | ICD-10-CM

## 2013-06-19 DIAGNOSIS — I10 Essential (primary) hypertension: Secondary | ICD-10-CM

## 2013-06-19 MED ORDER — AMLODIPINE BESYLATE 2.5 MG PO TABS: 2.5000 mg | ORAL_TABLET | Freq: Every day | ORAL | Status: DC

## 2013-06-19 NOTE — Progress Notes (Signed)
Patient ID: [name], female   DOB: Oct 04, 1918, 80 y.o.   MRN: [id number]    HPI  Patient is seen back today to followup her atrial fibrillation and shortness of breath. When I saw her last May 29, 2013 I was concerned that she might be having increased heart rate with ambulation. I tried to increase her diltiazem dose. She says that she felt dizzy with this. She said that it did not appear to help her sensation of increased heart rate. She tried on 2 occasions. She then cut back to once daily. She's not having any significant chest pain. Her overall exercise tolerance is limited.  Allergies  Allergen Reactions  . Contrast Media [Iodinated Diagnostic Agents] Other (See Comments)    No Contrast due to cerebral aneurysm per patient  . Ciprofloxacin     REACTION: itching , diarrhea  . Hydrocodone-Acetaminophen     REACTION: hallucination  . Pollen Extract     Current Outpatient Prescriptions  Medication Sig Dispense Refill  . Cyanocobalamin (B-12) 5000 MCG SUBL Place 1 tablet under the tongue daily.      Marland Kitchen diltiazem (DILACOR XR) 240 MG 24 hr capsule Take 240 mg by mouth daily. TAKE ONE CAPSULE BY MOUTH EVERY DAY      . Docusate Calcium (STOOL SOFTENER PO) Take 1 tablet by mouth daily.      . ferrous sulfate 325 (65 FE) MG tablet Take 325 mg by mouth daily with breakfast.      . furosemide (LASIX) 40 MG tablet Take 1 tablet (40 mg total) by mouth 2 (two) times daily.  60 tablet  6  . levothyroxine (SYNTHROID, LEVOTHROID) 50 MCG tablet Take 50 mcg by mouth daily before breakfast. repeat labs are due now      . Magnesium 250 MG TABS Take 1 tablet by mouth daily.      . metoprolol succinate (TOPROL-XL) 25 MG 24 hr tablet TAKE 1 TABLET (25 MG TOTAL) BY MOUTH DAILY.  30 tablet  8  . multivitamin-lutein (OCUVITE-LUTEIN) CAPS capsule Take 1 capsule by mouth daily.      . Potassium Gluconate 595 MG CAPS Take 1 capsule by mouth daily.      . pravastatin (PRAVACHOL) 20 MG tablet Take 1  tablet (20 mg total) by mouth daily.  30 tablet  6  . Probiotic Product (ALIGN) 4 MG CAPS Take 1 capsule by mouth daily.        Marland Kitchen Propylene Glycol (SYSTANE BALANCE OP) Place 1 drop into both eyes 2 (two) times daily.        . [DISCONTINUED] diltiazem (CARDIZEM CD) 240 MG 24 hr capsule Take 1 capsule (240 mg total) by mouth daily.  30 capsule  8   No current facility-administered medications for this visit.    History   Social History  . Marital Status: Divorced    Spouse Name: N/A    Number of Children: N/A  . Years of Education: N/A   Occupational History  . Retired    Social History Main Topics  . Smoking status: Never Smoker   . Smokeless tobacco: Never Used  . Alcohol Use: No  . Drug Use: No  . Sexual Activity: No   Other Topics Concern  . Not on file   Social History Narrative   Divorced. Education: [employer]. Exercise in chair daily.    Family History  Problem Relation Age of Onset  . Aortic aneurysm Brother   . Breast cancer Sister  1st degree relative <50  . Coronary artery disease Mother     1st degree relative <60  . Stroke Father     Female- 1st degree relative <50    Past Medical History  Diagnosis Date  . Osteopenia   . History of cervical cancer   . Benign neoplasm of adrenal gland   . Hypothyroidism   . COPD (chronic obstructive pulmonary disease)   . Esophageal reflux   . Osteoarthrosis, unspecified whether generalized or localized, unspecified site   . Unspecified pleural effusion   . Subarachnoid hemorrhage     No coumadin or ASA-Dr.Poole Neurosurgery.Marland Kitchenaneurysm..no treatment  . Hypertension   . Hyperlipidemia   . Personal history of venous thrombosis and embolism     No coumadin because of Subarachnoid hemorrhage  . Unspecified disorder resulting from impaired renal function   . Congestive heart failure, unspecified     EF 55-60%, echo, March, 2011  . GERD (gastroesophageal reflux disease)   . Aortic insufficiency     Mild, echo,  March, 2011  . Mitral regurgitation     Mild, echo, March, 2011  . Aortic root dilatation     4.5 cm echo, November, 2010 / no mention  . Dizziness   . Anemia   . Chronic kidney disease   . Pulmonary embolism     Also DVT but no Coumadin because of subarachnoid hemorrhage  . Atrial fibrillation     cannot take Coumadin /  Holter May, 2011.. mild bradycardia digoxin stopped / September, 2011, shortness of breath and palpitations.. digoxin resume /  March, 2012 can't find it digoxin on med list... to be restarted  . Shingles     November, 2011  . Ejection fraction     55-60%, echo, March, 2011  . Shortness of breath     Nuclear 07/11/2010,,73%, no ischemia  . Renal insufficiency   . Macular degeneration (senile) of retina, unspecified   . Myalgia and myositis, unspecified   . B12 deficiency anemia   . Pulmonary embolism   . Stroke     December, 7322, probably embolic from atrial fib, patient cannot be treated because of history of an aneurysm.  . Brain aneurysm 2007  . Thrombocytopenia     Dr. Marin Olp  . Clotting disorder   . Osteoporosis     Past Surgical History  Procedure Laterality Date  . Foot surgery    . Cataract extraction    . Cholecystectomy    . Pulmonary embolism surgery      vena cava filter  . Orif hip fracture      07/2007  . Epidural block injection      11/2009  . Abdominal hysterectomy    . Appendectomy    . Total hip arthroplasty    . Joint replacement      Patient Active Problem List   Diagnosis Date Noted  . Shortness of breath     Priority: High  . Melanoma in situ of lower leg 01/09/2013  . Skin nodule 09/26/2012  . Dog bite of right lower leg 08/25/2012  . Thrombocytopenia   . Fatigue 07/13/2011  . Depression 06/10/2011  . Excessive gas 06/10/2011  . Stroke   . Brain aneurysm   . CKD (chronic kidney disease) stage 3, GFR 30-59 ml/min 02/11/2011  . CVA (cerebral infarction) 02/09/2011    Class: Acute  . Hemiplegia affecting dominant  side 02/09/2011    Class: Acute  . Chronic diarrhea 09/17/2010  . Congestive heart failure,  unspecified   . Ejection fraction   . Atrial fibrillation   . Unspecified pleural effusion   . Subarachnoid hemorrhage   . Aortic insufficiency   . Mitral regurgitation   . Aortic root dilatation   . Anemia   . Pulmonary embolism   . Atrial fibrillation   . Shingles   . BRUISE 03/19/2010  . TINEA CORPORIS 12/24/2009  . RENAL INSUFFICIENCY 08/20/2009  . GERD 06/06/2009  . EDEMA 05/03/2009  . ATRIAL FIBRILLATION WITH RAPID VENTRICULAR RESPONSE 04/10/2009  . DIVERTICULITIS, ACUTE 03/18/2009  . RENAL CYST 03/18/2009  . DYSPNEA/SHORTNESS OF BREATH 02/13/2009  . MACULAR DEGENERATION, EARLY 01/14/2009  . SHORTNESS OF BREATH 12/03/2008  . AORTIC INSUFFICIENCY 12/02/2008  . UNSPECIFIED THROMBOCYTOPENIA 10/26/2008  . MYALGIA 05/24/2008  . BACK PAIN 01/02/2008  . ANEURYSM, THORACIC AORTIC 09/15/2007  . Nonspecific (abnormal) findings on radiological and other examination of body structure 09/15/2007  . CT, CHEST, ABNORMAL 09/15/2007  . HEADACHE 09/09/2007  . Subarachnoid hemorrhage 07/31/2007  . HIP FRACTURE, RIGHT 07/31/2007  . PAIN IN THORACIC SPINE 04/29/2007  . OSTEOPENIA 02/28/2007  . BENIGN NEOPLASM OF ADRENAL GLAND 01/25/2007  . HYPOTHYROIDISM 01/25/2007  . COPD 01/25/2007  . OSTEOARTHRITIS 01/25/2007  . URINARY INCONTINENCE 01/25/2007  . CERVICAL CANCER, HX OF 01/25/2007  . CHEST PAIN 01/12/2007  . ANEMIA, B12 DEFICIENCY 08/11/2006  . BREAST CYST, LEFT 08/11/2006  . FOOT SURGERY, HX OF 08/11/2006  . DIZZINESS 07/21/2006  . HYPERLIPIDEMIA 02/15/2006  . HYPERTENSION 02/15/2006  . Personal History of Venous Thrombosis and Embolism 02/15/2006    ROS   Patient denies fever, chills, headache, sweats, rash, change in vision, change in hearing, chest pain, cough, nausea or vomiting, urinary symptoms. All other systems are reviewed and are negative.  PHYSICAL EXAM  Patient is here  with her caregiver. She is oriented to person time and place. Affect is normal. Head is atraumatic. Sclera and conjunctiva are normal. There is no jugulovenous distention. There is kyphosis of the spine. Lungs reveal no significant abnormalities. There is no respiratory distress. The abdomen is soft. There is no peripheral edema. There are no musculoskeletal deformities. There are no skin rashes. She does walk with a cane. We checked her O2 sat. And had her walk checking her sat and her pulse. The pulse rate is difficult because of her atrial fib. The patient's O2 sat remained stable in the range of 90-91 with walking. Her atrial fib rate did increase his high as 116 but there was no marked increased. Her resting heart rate was approximately 106 after she sat down when exercise was completed.  Filed Vitals:   06/19/13 1053  BP: 153/93  Pulse: 79  Height: 5\' 3"  (1.6 m)  Weight: 137 lb 12.8 oz (62.506 kg)     ASSESSMENT & PLAN

## 2013-06-19 NOTE — Assessment & Plan Note (Signed)
She continues to have exertional shortness of breath. She does not appear to be dropping her O2 sat significantly. It does not appear that she's having marked tachycardia. I will leave the shortness of breath evaluation to her primary team. No further changes from the cardiac viewpoint.

## 2013-06-19 NOTE — Assessment & Plan Note (Signed)
She brings me blood pressure recordings from home. At times her diastolic does go above 90 and her systolic does go above 678. She had tolerated amlodipine in the past at 5 mg. I'm going to restart amlodipine at 2.5 mg.

## 2013-06-19 NOTE — Assessment & Plan Note (Signed)
At this point her atrial fib rate appears to be adequately controlled. I considered adding digoxin but I feel this is not prudent considering multiple factors. She cannot be anticoagulated for multiple reasons. No change in therapy.

## 2013-06-19 NOTE — Patient Instructions (Signed)
Your physician has recommended you make the following change in your medication: decrease Amlodipine to 2.5 mg daily and Diltiazem to taking 1 tablet one daily  Your physician recommends that you schedule a follow-up appointment in: 3 months

## 2013-06-28 ENCOUNTER — Telehealth: Payer: Self-pay | Admitting: Cardiology

## 2013-06-28 NOTE — Telephone Encounter (Signed)
Patient is feeling very weak. She feels that this must be from her medications. She would like to discuss her meds. Please call and advise.

## 2013-06-28 NOTE — Telephone Encounter (Signed)
Patient has been feeling "awful" for 3 days.   C/o extreme weakness walking around and has spent most of the last 3 days "propped up on my daybed" She is eating and drinking normally. No other new symptoms.  At OV 5/11 her amlodipine was restarted.  She is taking 2.5 mg daily. (she had taken 5 mg previously per OV note) Recent BPs: Monday 137/79, 96 Tuesday 107/68. 89 Today 125/66, 98 These were all before AM meds.  Recheck during phone call is 139/72, 80 She wants Dr. Ron Parker to know and is worried this is a s/e of amlodipine.  Advised patient Dr. Ron Parker is not in today and that she should contact her PCP with this information as well. She verbalizes understanding and agreement. She will call them today.

## 2013-06-28 NOTE — Telephone Encounter (Signed)
Discussed with Truitt Merle, NP Recommends to hold amlodipine for couple days to see how she feels. Instructed patient to hold med thurs and fri and call Friday afternoon with an update. She verbalizes understanding. She did call PCP and has appointment with them tomorrow.

## 2013-06-29 ENCOUNTER — Ambulatory Visit (INDEPENDENT_AMBULATORY_CARE_PROVIDER_SITE_OTHER): Payer: Medicare Other | Admitting: Family Medicine

## 2013-06-29 ENCOUNTER — Encounter: Payer: Self-pay | Admitting: Family Medicine

## 2013-06-29 VITALS — BP 120/78 | HR 85 | Temp 98.1°F | Wt 137.0 lb

## 2013-06-29 DIAGNOSIS — I10 Essential (primary) hypertension: Secondary | ICD-10-CM

## 2013-06-29 DIAGNOSIS — R5383 Other fatigue: Principal | ICD-10-CM

## 2013-06-29 DIAGNOSIS — I4891 Unspecified atrial fibrillation: Secondary | ICD-10-CM

## 2013-06-29 DIAGNOSIS — E785 Hyperlipidemia, unspecified: Secondary | ICD-10-CM

## 2013-06-29 DIAGNOSIS — R5381 Other malaise: Secondary | ICD-10-CM

## 2013-06-29 DIAGNOSIS — I509 Heart failure, unspecified: Secondary | ICD-10-CM

## 2013-06-29 MED ORDER — CYANOCOBALAMIN 1000 MCG/ML IJ SOLN
1000.0000 ug | Freq: Once | INTRAMUSCULAR | Status: AC
  Administered 2013-06-29: 1000 ug via INTRAMUSCULAR

## 2013-06-29 NOTE — Progress Notes (Signed)
Pre visit review using our clinic review tool, if applicable. No additional management support is needed unless otherwise documented below in the visit note. 

## 2013-06-29 NOTE — Progress Notes (Signed)
   Subjective:    Patient ID: [name], female    DOB: 1918/04/28, 80 y.o.   MRN: [id number]  HPI Pt here f/u cardiology. Pt d/c wrong bp med-- she stopped the diltiazem.  Pt did not take it yesterday or today.  Pt c/o extreme fatigue.  meds adjusted some by cardiology on 5/11 but pt still feeling tired.  She called cardiology yesterday and was instructed to stop the norvasc but she misunderstood and stopped the diltiazem.     Review of Systems As above    Objective:   Physical Exam BP 120/78  Pulse 85  Temp(Src) 98.1 F (36.7 C) (Oral)  Wt 137 lb (62.143 kg)  SpO2 95% General appearance: alert, cooperative, appears stated age and no distress Throat: lips, mucosa, and tongue normal; teeth and gums normal Neck: no adenopathy, supple, symmetrical, trachea midline and thyroid not enlarged, symmetric, no tenderness/mass/nodules Lungs: clear to auscultation bilaterally Heart: regular rate and rhythm, S1, S2 normal, no murmur, click, rub or gallop Extremities: extremities normal, atraumatic, no cyanosis or edema       Assessment & Plan:  1. Other malaise and fatigue D/c norvasc and con't diltiazem rto 2-3 weeks cyananocobalamin ((VITAMIN B-12)) injection 1,000 mcg; Inject 1 mL (1,000 mcg total) into the muscle once.  2. Atrial fibrillation con't meds per card  3. CHF (congestive heart failure) con't meds Pt on lasix  4. HTN (hypertension) See meds and orders---see home bp scanned in  5. Other and unspecified hyperlipidemia Check labs con't meds

## 2013-06-29 NOTE — Patient Instructions (Signed)

## 2013-06-30 ENCOUNTER — Telehealth: Payer: Self-pay | Admitting: Family Medicine

## 2013-06-30 NOTE — Telephone Encounter (Signed)
Relevant patient education mailed to patient.  

## 2013-07-13 ENCOUNTER — Ambulatory Visit (INDEPENDENT_AMBULATORY_CARE_PROVIDER_SITE_OTHER): Payer: Medicare Other | Admitting: Family Medicine

## 2013-07-13 ENCOUNTER — Encounter: Payer: Self-pay | Admitting: Family Medicine

## 2013-07-13 VITALS — BP 122/82 | HR 74 | Temp 98.2°F | Wt 138.0 lb

## 2013-07-13 DIAGNOSIS — R269 Unspecified abnormalities of gait and mobility: Secondary | ICD-10-CM

## 2013-07-13 DIAGNOSIS — R5383 Other fatigue: Secondary | ICD-10-CM

## 2013-07-13 DIAGNOSIS — E539 Vitamin B deficiency, unspecified: Secondary | ICD-10-CM

## 2013-07-13 DIAGNOSIS — R5381 Other malaise: Secondary | ICD-10-CM

## 2013-07-13 MED ORDER — CYANOCOBALAMIN 1000 MCG/ML IJ SOLN
1000.0000 ug | Freq: Once | INTRAMUSCULAR | Status: AC
  Administered 2013-07-13: 1000 ug via INTRAMUSCULAR

## 2013-07-13 NOTE — Addendum Note (Signed)
Addended by: Rudene Anda on: 07/13/2013 01:59 PM   Modules accepted: Orders

## 2013-07-13 NOTE — Progress Notes (Signed)
Pre visit review using our clinic review tool, if applicable. No additional management support is needed unless otherwise documented below in the visit note. 

## 2013-07-13 NOTE — Progress Notes (Signed)
   Subjective:    Patient ID: [name], female    DOB: [birth year], 80 y.o.   MRN: [id number]  HPI Pt here stating she is feeling much better after the b 12 injection but has had some close calls with falls lately.  Pt is still walking with cane.  The walker is not working well in her house per pt and her friend.     Review of Systems As above    Objective:   Physical Exam  BP 122/82  Pulse 74  Temp(Src) 98.2 F (36.8 C) (Oral)  Wt 138 lb (62.596 kg)  SpO2 97% General appearance: alert, cooperative, appears stated age and no distress Lungs: clear to auscultation bilaterally Heart: S1, S2 normal, irregular Extremities: extremities normal, atraumatic, no cyanosis or edema      Assessment & Plan:  1. Vitamin B deficiency Check labs,  b12 injection  - CBC with Differential - Vitamin B12

## 2013-07-13 NOTE — Patient Instructions (Signed)

## 2013-07-14 LAB — CBC WITH DIFFERENTIAL/PLATELET
Basophils Absolute: 0.1 10*3/uL (ref 0.0–0.1)
Basophils Relative: 1 % (ref 0.0–3.0)
EOS ABS: 0.2 10*3/uL (ref 0.0–0.7)
EOS PCT: 3.7 % (ref 0.0–5.0)
HCT: 41.3 % (ref 36.0–46.0)
Hemoglobin: 13.6 g/dL (ref 12.0–15.0)
LYMPHS ABS: 2.4 10*3/uL (ref 0.7–4.0)
Lymphocytes Relative: 37.3 % (ref 12.0–46.0)
MCHC: 33 g/dL (ref 30.0–36.0)
MCV: 98.2 fl (ref 78.0–100.0)
Monocytes Absolute: 0.5 10*3/uL (ref 0.1–1.0)
Monocytes Relative: 8.1 % (ref 3.0–12.0)
Neutro Abs: 3.2 10*3/uL (ref 1.4–7.7)
Neutrophils Relative %: 49.9 % (ref 43.0–77.0)
PLATELETS: 118 10*3/uL — AB (ref 150.0–400.0)
RBC: 4.2 Mil/uL (ref 3.87–5.11)
RDW: 14.1 % (ref 11.5–15.5)
WBC: 6.4 10*3/uL (ref 4.0–10.5)

## 2013-07-14 LAB — VITAMIN B12

## 2013-07-20 ENCOUNTER — Other Ambulatory Visit: Payer: Self-pay

## 2013-07-20 DIAGNOSIS — D7289 Other specified disorders of white blood cells: Secondary | ICD-10-CM

## 2013-07-31 ENCOUNTER — Other Ambulatory Visit: Payer: Self-pay | Admitting: Cardiology

## 2013-08-15 ENCOUNTER — Ambulatory Visit (INDEPENDENT_AMBULATORY_CARE_PROVIDER_SITE_OTHER): Payer: Medicare Other | Admitting: *Deleted

## 2013-08-15 ENCOUNTER — Other Ambulatory Visit (INDEPENDENT_AMBULATORY_CARE_PROVIDER_SITE_OTHER): Payer: Medicare Other

## 2013-08-15 DIAGNOSIS — D7289 Other specified disorders of white blood cells: Secondary | ICD-10-CM

## 2013-08-15 DIAGNOSIS — E538 Deficiency of other specified B group vitamins: Secondary | ICD-10-CM

## 2013-08-15 LAB — CBC WITH DIFFERENTIAL/PLATELET
BASOS ABS: 0 10*3/uL (ref 0.0–0.1)
Basophils Relative: 0.3 % (ref 0.0–3.0)
Eosinophils Absolute: 0.2 10*3/uL (ref 0.0–0.7)
Eosinophils Relative: 3.1 % (ref 0.0–5.0)
HEMATOCRIT: 40.7 % (ref 36.0–46.0)
Hemoglobin: 13.5 g/dL (ref 12.0–15.0)
LYMPHS ABS: 2.3 10*3/uL (ref 0.7–4.0)
Lymphocytes Relative: 38.6 % (ref 12.0–46.0)
MCHC: 33.2 g/dL (ref 30.0–36.0)
MCV: 99.2 fl (ref 78.0–100.0)
MONO ABS: 0.4 10*3/uL (ref 0.1–1.0)
Monocytes Relative: 7.5 % (ref 3.0–12.0)
NEUTROS PCT: 50.5 % (ref 43.0–77.0)
Neutro Abs: 3 10*3/uL (ref 1.4–7.7)
Platelets: 128 10*3/uL — ABNORMAL LOW (ref 150.0–400.0)
RBC: 4.1 Mil/uL (ref 3.87–5.11)
RDW: 13.8 % (ref 11.5–15.5)
WBC: 5.9 10*3/uL (ref 4.0–10.5)

## 2013-08-15 MED ORDER — CYANOCOBALAMIN 1000 MCG/ML IJ SOLN
1000.0000 ug | Freq: Once | INTRAMUSCULAR | Status: AC
  Administered 2013-08-15: 1000 ug via INTRAMUSCULAR

## 2013-09-28 ENCOUNTER — Ambulatory Visit: Payer: Medicare Other | Admitting: Cardiology

## 2013-10-11 ENCOUNTER — Ambulatory Visit: Payer: Medicare Other | Admitting: Cardiology

## 2013-11-09 DEATH — deceased

## 2015-02-14 ENCOUNTER — Encounter: Payer: Self-pay | Admitting: Cardiology
